# Patient Record
Sex: Female | Born: 1970 | Race: White | Hispanic: Yes | Marital: Single | Smoking: Former smoker
Health system: Southern US, Community
[De-identification: ages and names within clinical notes are randomized; demographics above are authoritative.]

## PROBLEM LIST (undated history)

## (undated) DIAGNOSIS — F32A Depression, unspecified: Secondary | ICD-10-CM

## (undated) DIAGNOSIS — E119 Type 2 diabetes mellitus without complications: Secondary | ICD-10-CM

## (undated) DIAGNOSIS — I10 Essential (primary) hypertension: Secondary | ICD-10-CM

## (undated) DIAGNOSIS — F329 Major depressive disorder, single episode, unspecified: Secondary | ICD-10-CM

## (undated) DIAGNOSIS — E079 Disorder of thyroid, unspecified: Secondary | ICD-10-CM

---

## 2017-04-18 ENCOUNTER — Encounter: Admit: 2017-04-18 | Discharge: 2017-04-18

## 2017-04-18 NOTE — Progress Notes
Pt in ED at [facility] with c/o Suicidal Ideation.  Per [place] psych CN - service is at capacity.

## 2017-04-19 ENCOUNTER — Encounter: Admit: 2017-04-19 | Discharge: 2017-04-19

## 2017-06-04 ENCOUNTER — Encounter: Admit: 2017-06-04 | Discharge: 2017-06-04

## 2017-07-03 ENCOUNTER — Emergency Department (HOSPITAL_COMMUNITY)
Admission: EM | Admit: 2017-07-03 | Discharge: 2017-07-05 | Disposition: A | Discharge status: Other Acute Inpatient Hospital | Payer: Medicaid Other | Attending: Emergency Medicine | Admitting: Emergency Medicine

## 2017-07-03 ENCOUNTER — Encounter (HOSPITAL_COMMUNITY): Payer: Self-pay | Admitting: Emergency Medicine

## 2017-07-03 DIAGNOSIS — R45851 Suicidal ideations: Secondary | ICD-10-CM | POA: Insufficient documentation

## 2017-07-03 DIAGNOSIS — Z7984 Long term (current) use of oral hypoglycemic drugs: Secondary | ICD-10-CM | POA: Diagnosis not present

## 2017-07-03 DIAGNOSIS — Z79899 Other long term (current) drug therapy: Secondary | ICD-10-CM | POA: Insufficient documentation

## 2017-07-03 DIAGNOSIS — I10 Essential (primary) hypertension: Secondary | ICD-10-CM | POA: Diagnosis not present

## 2017-07-03 DIAGNOSIS — X838XXA Intentional self-harm by other specified means, initial encounter: Secondary | ICD-10-CM | POA: Insufficient documentation

## 2017-07-03 DIAGNOSIS — S0081XA Abrasion of other part of head, initial encounter: Secondary | ICD-10-CM | POA: Diagnosis not present

## 2017-07-03 DIAGNOSIS — Z046 Encounter for general psychiatric examination, requested by authority: Secondary | ICD-10-CM | POA: Diagnosis not present

## 2017-07-03 DIAGNOSIS — F329 Major depressive disorder, single episode, unspecified: Secondary | ICD-10-CM | POA: Insufficient documentation

## 2017-07-03 DIAGNOSIS — Y9389 Activity, other specified: Secondary | ICD-10-CM | POA: Diagnosis not present

## 2017-07-03 DIAGNOSIS — E119 Type 2 diabetes mellitus without complications: Secondary | ICD-10-CM | POA: Insufficient documentation

## 2017-07-03 DIAGNOSIS — S0993XA Unspecified injury of face, initial encounter: Secondary | ICD-10-CM | POA: Diagnosis present

## 2017-07-03 DIAGNOSIS — Y999 Unspecified external cause status: Secondary | ICD-10-CM | POA: Insufficient documentation

## 2017-07-03 DIAGNOSIS — Y9289 Other specified places as the place of occurrence of the external cause: Secondary | ICD-10-CM | POA: Insufficient documentation

## 2017-07-03 DIAGNOSIS — F419 Anxiety disorder, unspecified: Secondary | ICD-10-CM | POA: Insufficient documentation

## 2017-07-03 DIAGNOSIS — R451 Restlessness and agitation: Secondary | ICD-10-CM | POA: Insufficient documentation

## 2017-07-03 HISTORY — DX: Type 2 diabetes mellitus without complications: E11.9

## 2017-07-03 HISTORY — DX: Major depressive disorder, single episode, unspecified: F32.9

## 2017-07-03 HISTORY — DX: Disorder of thyroid, unspecified: E07.9

## 2017-07-03 HISTORY — DX: Essential (primary) hypertension: I10

## 2017-07-03 HISTORY — DX: Depression, unspecified: F32.A

## 2017-07-03 LAB — CBC
HCT: 32.3 % — ABNORMAL LOW (ref 36.0–46.0)
HEMOGLOBIN: 11.4 g/dL — AB (ref 12.0–15.0)
MCH: 31.9 pg (ref 26.0–34.0)
MCHC: 35.3 g/dL (ref 30.0–36.0)
MCV: 90.5 fL (ref 78.0–100.0)
PLATELETS: 239 10*3/uL (ref 150–400)
RBC: 3.57 MIL/uL — ABNORMAL LOW (ref 3.87–5.11)
RDW: 12.8 % (ref 11.5–15.5)
WBC: 7.3 10*3/uL (ref 4.0–10.5)

## 2017-07-03 LAB — COMPREHENSIVE METABOLIC PANEL
ALBUMIN: 4 g/dL (ref 3.5–5.0)
ALT: 18 U/L (ref 14–54)
ANION GAP: 8 (ref 5–15)
AST: 20 U/L (ref 15–41)
Alkaline Phosphatase: 77 U/L (ref 38–126)
BILIRUBIN TOTAL: 0.7 mg/dL (ref 0.3–1.2)
BUN: 16 mg/dL (ref 6–20)
CALCIUM: 9.1 mg/dL (ref 8.9–10.3)
CO2: 25 mmol/L (ref 22–32)
CREATININE: 0.75 mg/dL (ref 0.44–1.00)
Chloride: 108 mmol/L (ref 101–111)
GFR calc Af Amer: >60 mL/min (ref >60)
GFR calc non Af Amer: >60 mL/min (ref >60)
GLUCOSE: 111 mg/dL — AB (ref 65–99)
Potassium: 3.2 mmol/L — ABNORMAL LOW (ref 3.5–5.1)
Sodium: 141 mmol/L (ref 135–145)
TOTAL PROTEIN: 7.3 g/dL (ref 6.5–8.1)

## 2017-07-03 LAB — RAPID URINE DRUG SCREEN, HOSP PERFORMED
Amphetamines: NOT DETECTED
Barbiturates: NOT DETECTED
Benzodiazepines: POSITIVE — AB
COCAINE: NOT DETECTED
OPIATES: NOT DETECTED
Tetrahydrocannabinol: NOT DETECTED

## 2017-07-03 LAB — I-STAT BETA HCG BLOOD, ED (MC, WL, AP ONLY): I-stat hCG, quantitative: <5 m[IU]/mL (ref <5)

## 2017-07-03 NOTE — ED Notes (Signed)
Bed: WTR9 Expected date:  Expected time:  Means of arrival:  Comments: 
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
## 2017-07-04 ENCOUNTER — Encounter: Payer: Self-pay | Admitting: Psychiatry

## 2017-07-04 DIAGNOSIS — E119 Type 2 diabetes mellitus without complications: Secondary | ICD-10-CM

## 2017-07-04 DIAGNOSIS — E039 Hypothyroidism, unspecified: Secondary | ICD-10-CM | POA: Diagnosis present

## 2017-07-04 DIAGNOSIS — I10 Essential (primary) hypertension: Secondary | ICD-10-CM | POA: Diagnosis present

## 2017-07-04 DIAGNOSIS — R45851 Suicidal ideations: Secondary | ICD-10-CM

## 2017-07-04 DIAGNOSIS — F250 Schizoaffective disorder, bipolar type: Secondary | ICD-10-CM | POA: Diagnosis present

## 2017-07-04 LAB — ACETAMINOPHEN LEVEL

## 2017-07-04 LAB — ETHANOL: Alcohol, Ethyl (B): <10 mg/dL (ref <10)

## 2017-07-04 LAB — SALICYLATE LEVEL: Salicylate Lvl: <7 mg/dL (ref 2.8–30.0)

## 2017-07-04 MED ORDER — DICLOFENAC SODIUM 75 MG PO TBEC
75.0000 mg | DELAYED_RELEASE_TABLET | Freq: Two times a day (BID) | ORAL | Status: DC
  Administered 2017-07-04 – 2017-07-05 (×3): 75 mg via ORAL
  Filled 2017-07-04 (×5): qty 1

## 2017-07-04 MED ORDER — MIRTAZAPINE 30 MG PO TBDP
30.0000 mg | ORAL_TABLET | Freq: Every day | ORAL | Status: DC
  Administered 2017-07-04: 30 mg via ORAL
  Filled 2017-07-04 (×3): qty 1

## 2017-07-04 MED ORDER — QUETIAPINE FUMARATE 300 MG PO TABS
400.0000 mg | ORAL_TABLET | Freq: Every day | ORAL | Status: DC
  Administered 2017-07-04: 400 mg via ORAL
  Filled 2017-07-04 (×2): qty 1

## 2017-07-04 MED ORDER — OLANZAPINE 2.5 MG PO TABS
2.5000 mg | ORAL_TABLET | Freq: Once | ORAL | Status: AC
  Administered 2017-07-04: 2.5 mg via ORAL
  Filled 2017-07-04: qty 1

## 2017-07-04 MED ORDER — OLANZAPINE 10 MG PO TABS
20.0000 mg | ORAL_TABLET | Freq: Every day | ORAL | Status: DC
  Administered 2017-07-04: 20 mg via ORAL
  Filled 2017-07-04 (×2): qty 2

## 2017-07-04 MED ORDER — PROPRANOLOL HCL 20 MG PO TABS
20.0000 mg | ORAL_TABLET | Freq: Two times a day (BID) | ORAL | Status: DC
  Administered 2017-07-04 – 2017-07-05 (×3): 20 mg via ORAL
  Filled 2017-07-04 (×5): qty 1

## 2017-07-04 MED ORDER — LORAZEPAM 2 MG/ML IJ SOLN
2.0000 mg | Freq: Once | INTRAMUSCULAR | Status: AC
  Administered 2017-07-04: 2 mg via INTRAMUSCULAR
  Filled 2017-07-04: qty 1

## 2017-07-04 MED ORDER — LEVOTHYROXINE SODIUM 50 MCG PO TABS
50.0000 ug | ORAL_TABLET | Freq: Every day | ORAL | Status: DC
  Administered 2017-07-04 – 2017-07-05 (×2): 50 ug via ORAL
  Filled 2017-07-04 (×2): qty 1

## 2017-07-04 MED ORDER — ATORVASTATIN CALCIUM 10 MG PO TABS
10.0000 mg | ORAL_TABLET | Freq: Every day | ORAL | Status: DC
  Administered 2017-07-04: 10 mg via ORAL
  Filled 2017-07-04 (×2): qty 1

## 2017-07-04 MED ORDER — METFORMIN HCL 500 MG PO TABS
500.0000 mg | ORAL_TABLET | Freq: Every day | ORAL | Status: DC
  Administered 2017-07-04 – 2017-07-05 (×2): 500 mg via ORAL
  Filled 2017-07-04 (×2): qty 1

## 2017-07-04 MED ORDER — HALOPERIDOL LACTATE 5 MG/ML IJ SOLN
5.0000 mg | Freq: Once | INTRAMUSCULAR | Status: AC
  Administered 2017-07-04: 5 mg via INTRAMUSCULAR
  Filled 2017-07-04: qty 1

## 2017-07-04 MED ORDER — LOSARTAN POTASSIUM 50 MG PO TABS
50.0000 mg | ORAL_TABLET | Freq: Every day | ORAL | Status: DC
  Administered 2017-07-04 – 2017-07-05 (×2): 50 mg via ORAL
  Filled 2017-07-04 (×2): qty 1

## 2017-07-04 NOTE — ED Notes (Signed)
Pt requesting a "tranquilizer" and attempting to leave the room.

## 2017-07-04 NOTE — ED Notes (Signed)
Psych Provider at bedside. 
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
## 2017-07-04 NOTE — ED Notes (Signed)
TTS assessment in progress. 

## 2017-07-04 NOTE — ED Notes (Signed)
Awaiting on psych bed placement

## 2017-07-04 NOTE — ED Notes (Signed)
Pt requesting anti anxiety medication, NP notified

## 2017-07-04 NOTE — ED Notes (Signed)
Pt A&O x 3,  Agitated, pacing, up to desk multiple times, redirected back to room.  Monitoring for safety, Q 15 min checks in effect.

## 2017-07-04 NOTE — BH Assessment (Signed)
[facility] been [facility].  Accepting physician is Dr. Jennet MaduroPucilowska.  Attending Physician will be Dr. Jennet MaduroPucilowska.  Patient has been assigned to room 323, by [facility] Charge Nurse Vanice SarahAbi.  Call report to [phone].  Representative/Transfer Coordinator is Durward Mallardamille H Patient pre-admitted by [facility] Patient Access Mertie Clause(Jeanelle)   [facility] Staff Felipa Eth(Valarie, TTS) made aware of acceptance.

## 2017-07-04 NOTE — ED Notes (Signed)
RN ALEXIS FROM [facility] CALLED TO GET REPORT ON PT.  RN EXPLAINED TO RN ALEXIS THAT PT IS IVC AND WOULD NOT BE TRANSFERRED UNTIL AM BY [facility].  PTS ROOM ASSIGNMENT IS 323 AND DR Hinton DyerPUCILOWSKI IS ACCEPTING MD.

## 2017-07-04 NOTE — ED Notes (Signed)
MOTHERS CELL [phone] Frederic Jerichoat Give call when pt is d/ced

## 2017-07-04 NOTE — ED Notes (Signed)
Denies SI/HI

## 2017-07-04 NOTE — ED Notes (Signed)
[facility], pt received all night meds prior to arrival @ [facility].
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
## 2017-07-04 NOTE — ED Notes (Signed)
A[facility]

## 2017-07-04 NOTE — ED Notes (Signed)
[facility] TRANSPORT[facility] REQUESTED. 

## 2017-07-04 NOTE — BH Assessment (Signed)
[facility] Assessment Progress Note  Per Juanetta BeetsJacqueline Norman, DO, this pt requires psychiatric hospitalization at this time.  Pt presents under IVC [facility] by [facility], and upheld by Dr Sharma CovertNorman.  The following facilities have been contacted to seek placement for this pt, with results as noted:  Beds available, information sent, decision pending:  [facility] [place]   At capacity:  [facility]   [facility] [place], [facility] Coord[facility] [phone]

## 2017-07-05 ENCOUNTER — Other Ambulatory Visit: Payer: Self-pay

## 2017-07-05 ENCOUNTER — Inpatient Hospital Stay
Admission: AD | Admit: 2017-07-05 | Discharge: 2017-08-03 | DRG: 885 | Disposition: A | Discharge status: Home / Self Care | Payer: Medicaid Other | Attending: Psychiatry | Admitting: Psychiatry

## 2017-07-05 DIAGNOSIS — Z87891 Personal history of nicotine dependence: Secondary | ICD-10-CM | POA: Diagnosis not present

## 2017-07-05 DIAGNOSIS — R197 Diarrhea, unspecified: Secondary | ICD-10-CM | POA: Diagnosis not present

## 2017-07-05 DIAGNOSIS — E119 Type 2 diabetes mellitus without complications: Secondary | ICD-10-CM

## 2017-07-05 DIAGNOSIS — E785 Hyperlipidemia, unspecified: Secondary | ICD-10-CM | POA: Diagnosis present

## 2017-07-05 DIAGNOSIS — F419 Anxiety disorder, unspecified: Secondary | ICD-10-CM | POA: Diagnosis present

## 2017-07-05 DIAGNOSIS — R21 Rash and other nonspecific skin eruption: Secondary | ICD-10-CM | POA: Diagnosis not present

## 2017-07-05 DIAGNOSIS — Z881 Allergy status to other antibiotic agents status: Secondary | ICD-10-CM

## 2017-07-05 DIAGNOSIS — R45851 Suicidal ideations: Secondary | ICD-10-CM | POA: Diagnosis present

## 2017-07-05 DIAGNOSIS — E039 Hypothyroidism, unspecified: Secondary | ICD-10-CM | POA: Diagnosis present

## 2017-07-05 DIAGNOSIS — Z79890 Hormone replacement therapy (postmenopausal): Secondary | ICD-10-CM

## 2017-07-05 DIAGNOSIS — F250 Schizoaffective disorder, bipolar type: Secondary | ICD-10-CM | POA: Diagnosis present

## 2017-07-05 DIAGNOSIS — Z888 Allergy status to other drugs, medicaments and biological substances status: Secondary | ICD-10-CM | POA: Diagnosis not present

## 2017-07-05 DIAGNOSIS — Z9114 Patient's other noncompliance with medication regimen: Secondary | ICD-10-CM | POA: Diagnosis not present

## 2017-07-05 DIAGNOSIS — S0081XA Abrasion of other part of head, initial encounter: Secondary | ICD-10-CM | POA: Diagnosis not present

## 2017-07-05 DIAGNOSIS — Z7984 Long term (current) use of oral hypoglycemic drugs: Secondary | ICD-10-CM | POA: Diagnosis not present

## 2017-07-05 DIAGNOSIS — K5900 Constipation, unspecified: Secondary | ICD-10-CM | POA: Diagnosis not present

## 2017-07-05 DIAGNOSIS — F429 Obsessive-compulsive disorder, unspecified: Secondary | ICD-10-CM | POA: Diagnosis present

## 2017-07-05 DIAGNOSIS — I10 Essential (primary) hypertension: Secondary | ICD-10-CM | POA: Diagnosis present

## 2017-07-05 DIAGNOSIS — F5105 Insomnia due to other mental disorder: Secondary | ICD-10-CM | POA: Diagnosis present

## 2017-07-05 DIAGNOSIS — Z818 Family history of other mental and behavioral disorders: Secondary | ICD-10-CM

## 2017-07-05 DIAGNOSIS — R200 Anesthesia of skin: Secondary | ICD-10-CM

## 2017-07-05 MED ORDER — OLANZAPINE 10 MG PO TABS
30.0000 mg | ORAL_TABLET | Freq: Every day | ORAL | Status: DC
  Administered 2017-07-05 – 2017-07-13 (×9): 30 mg via ORAL
  Filled 2017-07-05 (×9): qty 3

## 2017-07-05 MED ORDER — DICLOFENAC SODIUM 75 MG PO TBEC
75.0000 mg | DELAYED_RELEASE_TABLET | Freq: Two times a day (BID) | ORAL | Status: DC
  Administered 2017-07-05 – 2017-07-06 (×2): 75 mg via ORAL
  Filled 2017-07-05 (×3): qty 1

## 2017-07-05 MED ORDER — ACETAMINOPHEN 325 MG PO TABS
650.0000 mg | ORAL_TABLET | Freq: Four times a day (QID) | ORAL | Status: DC | PRN
  Administered 2017-07-10 – 2017-07-12 (×3): 650 mg via ORAL
  Filled 2017-07-05 (×3): qty 2

## 2017-07-05 MED ORDER — MAGNESIUM HYDROXIDE 400 MG/5ML PO SUSP
30.0000 mL | Freq: Every day | ORAL | Status: DC | PRN
  Administered 2017-07-06 – 2017-07-14 (×4): 30 mL via ORAL
  Filled 2017-07-05 (×4): qty 30

## 2017-07-05 MED ORDER — LEVOTHYROXINE SODIUM 50 MCG PO TABS
50.0000 ug | ORAL_TABLET | Freq: Every day | ORAL | Status: DC
  Administered 2017-07-06: 50 ug via ORAL
  Filled 2017-07-05: qty 1

## 2017-07-05 MED ORDER — QUETIAPINE FUMARATE 200 MG PO TABS
400.0000 mg | ORAL_TABLET | Freq: Every day | ORAL | Status: DC
  Administered 2017-07-05: 400 mg via ORAL
  Filled 2017-07-05: qty 2

## 2017-07-05 MED ORDER — ALUM & MAG HYDROXIDE-SIMETH 200-200-20 MG/5ML PO SUSP: 30.0000 mL | ORAL | Status: DC | PRN

## 2017-07-05 MED ORDER — ATORVASTATIN CALCIUM 20 MG PO TABS
10.0000 mg | ORAL_TABLET | Freq: Every day | ORAL | Status: DC
  Administered 2017-07-05 – 2017-08-02 (×27): 10 mg via ORAL
  Filled 2017-07-05 (×26): qty 1

## 2017-07-05 MED ORDER — OLANZAPINE 2.5 MG PO TABS
2.5000 mg | ORAL_TABLET | Freq: Once | ORAL | Status: AC
  Administered 2017-07-05: 2.5 mg via ORAL
  Filled 2017-07-05: qty 1

## 2017-07-05 MED ORDER — LOSARTAN POTASSIUM 50 MG PO TABS
50.0000 mg | ORAL_TABLET | Freq: Every day | ORAL | Status: DC
  Administered 2017-07-05 – 2017-07-18 (×13): 50 mg via ORAL
  Filled 2017-07-05 (×14): qty 1

## 2017-07-05 MED ORDER — METFORMIN HCL 500 MG PO TABS
500.0000 mg | ORAL_TABLET | Freq: Two times a day (BID) | ORAL | Status: DC
  Administered 2017-07-05 – 2017-07-18 (×26): 500 mg via ORAL
  Filled 2017-07-05 (×27): qty 1

## 2017-07-05 MED ORDER — TRAZODONE HCL 100 MG PO TABS
100.0000 mg | ORAL_TABLET | Freq: Every day | ORAL | Status: DC
  Administered 2017-07-05: 100 mg via ORAL
  Filled 2017-07-05 (×2): qty 1

## 2017-07-05 MED ORDER — PROPRANOLOL HCL 20 MG PO TABS
20.0000 mg | ORAL_TABLET | Freq: Two times a day (BID) | ORAL | Status: DC
  Administered 2017-07-05 – 2017-07-06 (×2): 20 mg via ORAL
  Filled 2017-07-05 (×2): qty 1

## 2017-07-05 NOTE — Progress Notes (Addendum)
Patient found in day room with sister upon my arrival. Patient is visible and social throughout the evening. Patient is hyperactive and preoccupied with shaving her armpits. No staff was available to watch her shave and patient had to be told multiple times. Patient asked to shave in excess of twenty times. Denies SI, HI, AVH. Reports improvement in mood (depression, irritability, and anxiety). Patient remains manic and unable to sit still. Speech is pressured and perseverative. Patient requests 400mg  Seroquel after telling writer that she is not able to sleep with Trazodone. At first, patient reports that she has adverse reaction to Trazodone but then changed her report to state that she felt she would not be able to sleep without Seroquel. Requests and is given Trazodone by patient request. Patient appears to be sleeping @ 2300. Reports eating and voiding adequately. Denies pain. Q 15 minute checks maintained. Will monitor throughout the shift.  Patient slept restlessly. Patient believes it is due to not receiving Remeron. Requests and is given Synthroid early. States, "I take that at 6am at home. It has to be way before food." Will endorse care to oncoming shift.

## 2017-07-05 NOTE — Progress Notes (Addendum)
Recreation Therapy Notes  Date: 01.30.2018  Time: 3:00pm  Location: Craft room  Behavioral response: Appropriate  Group Type: Craft  Participation level: Active  Communication: Patient was social with peers and staff during group.  Comments: N/A  Spyros Winch LRT/CTRS        Cabell Lazenby 07/05/2017 4:00 PM

## 2017-07-05 NOTE — Plan of Care (Signed)
  Progressing Spiritual Needs Ability to function at adequate level 07/05/2017 1557 - Progressing by Crisoforo OxfordKyei, Felecity Lemaster, RN Activity: Interest or engagement in leisure activities will improve 07/05/2017 1557 - Progressing by Crisoforo OxfordKyei, Samvel Zinn, RN Imbalance in normal sleep/wake cycle will improve 07/05/2017 1557 - Progressing by Crisoforo OxfordKyei, Jamaria Amborn, RN Education: Utilization of techniques to improve thought processes will improve 07/05/2017 1557 - Progressing by Crisoforo OxfordKyei, Gean Larose, RN Knowledge of the prescribed therapeutic regimen will improve 07/05/2017 1557 - Progressing by Crisoforo OxfordKyei, Oluwateniola Leitch, RN Coping: Ability to cope will improve 07/05/2017 1557 - Progressing by Crisoforo OxfordKyei, Brenae Lasecki, RN Ability to verbalize feelings will improve 07/05/2017 1557 - Progressing by Crisoforo OxfordKyei, Janean Eischen, RN Health Behavior/Discharge Planning: Ability to make decisions will improve 07/05/2017 1557 - Progressing by Crisoforo OxfordKyei, Nishi Neiswonger, RN Compliance with therapeutic regimen will improve 07/05/2017 1557 - Progressing by Crisoforo OxfordKyei, Vashon Arch, RN Role Relationship: Ability to demonstrate positive changes in social behaviors and relationships will improve 07/05/2017 1557 - Progressing by Crisoforo OxfordKyei, Makhya Arave, RN Safety: Ability to disclose and discuss suicidal ideas will improve 07/05/2017 1557 - Progressing by Crisoforo OxfordKyei, Winfred Iiams, RN Ability to identify and utilize support systems that promote safety will improve 07/05/2017 1557 - Progressing by Crisoforo OxfordKyei, Abimael Zeiter, RN Self-Concept: Ability to verbalize positive feelings about self will improve 07/05/2017 1557 - Progressing by Crisoforo OxfordKyei, Jaymeson Mengel, RN

## 2017-07-05 NOTE — Progress Notes (Signed)
EKG not done. We run out of paper in the machine. Went to  Cardiopulmonary to get a roll of paper but was told they do not carry EKG papers. Was directed to call supply chain. Supply chain indicated they has processed the order. Night shift nurse informed to do the EKG when the paper gets to the unit.

## 2017-07-05 NOTE — Plan of Care (Signed)
  Progressing Coping: Ability to verbalize feelings will improve 07/05/2017 2318 - Progressing by Galen ManilaVigil, Anastasha Ortez E, RN

## 2017-07-05 NOTE — Progress Notes (Signed)
The patient is admitted [facility] to room [facility]. Alert and oriented  x 4. Denied any acute pain, no acute distress noted. Patient is IVC. Denied any SI/HI at this time. Verbalized she's still depressed and anxious but doesn't have any thought of harming herself at this moment. [facility] with [facility], noted redness on her forehead, tattoo on her lower back and at the back of her neck.  Patient's belongings searched and one pair of black boots locked in security locker. No contraband found on patient or her belongings. Observed some impulsiveness when trying to admit patient into Epic. Patient didn't have patience to wait and kept on interrupting the RN. Patient oriented to  her room. Surveillance camera and 15 minutes checks continue.  Will continue to monitor.

## 2017-07-05 NOTE — ED Provider Notes (Signed)
Patient accepted to [facility] for psych.  EMTALA performed   Gerhard MunchLockwood, Princella Jaskiewicz, MD 07/05/17 [phone]

## 2017-07-05 NOTE — Tx Team (Signed)
Initial Treatment Plan 07/05/2017 3:39 PM Jeanne Hendricks ZOX:[id number]    PATIENT STRESSORS: Health problems   PATIENT STRENGTHS: Ability for insight Motivation for treatment/growth   PATIENT IDENTIFIED PROBLEMS:                      DISCHARGE CRITERIA:  Ability to meet basic life and health needs Improved stabilization in mood, thinking, and/or behavior Motivation to continue treatment in a less acute level of care Need for constant or close observation no longer present Reduction of life-threatening or endangering symptoms to within safe limits Verbal commitment to aftercare and medication compliance  PRELIMINARY DISCHARGE PLAN: Outpatient therapy  PATIENT/FAMILY INVOLVEMENT: This treatment plan has been presented to and reviewed with the patient, Jeanne Hendricks, and/or family member,   The patient and family have been given the opportunity to ask questions and make suggestions.  Crisoforo Oxfordharlotte Torben Soloway, RN 07/05/2017, 3:39 PM

## 2017-07-05 NOTE — BH Assessment (Signed)
BHH Assessment Progress Note  Pt presented under IVC initiated on 06/30/2017, with a Findings and Custody Order issued by the magistrate on the same day.  Law enforcement, however, did not serve it until 07/03/2017 by which time the Findings and Custody Order had expired.  Per Juanetta BeetsJacqueline Norman, DO, pt continues to require psychiatric hospitalization, and to meet criteria for IVC, which she has initiated.  [place], and at 11:42 Burna CashMagistrate Morton confirms receipt.  Findings and Custody Order has since been served, and IVC documents have been faxed to [facility].  At 13:16 Fed[place] confirms receipt.  Jeanne Hendricks, [place] Behavioral Health Coordinator [phone]

## 2017-07-06 DIAGNOSIS — F429 Obsessive-compulsive disorder, unspecified: Secondary | ICD-10-CM | POA: Diagnosis present

## 2017-07-06 LAB — TSH: TSH: 31.372 u[IU]/mL — ABNORMAL HIGH (ref 0.350–4.500)

## 2017-07-06 LAB — LIPID PANEL
CHOL/HDL RATIO: 2.6 ratio
CHOLESTEROL: 103 mg/dL (ref 0–200)
HDL: 39 mg/dL — AB (ref >40)
LDL Cholesterol: 42 mg/dL (ref 0–99)
Triglycerides: 112 mg/dL (ref <150)
VLDL: 22 mg/dL (ref 0–40)

## 2017-07-06 LAB — HEMOGLOBIN A1C
Hgb A1c MFr Bld: 5.1 % (ref 4.8–5.6)
MEAN PLASMA GLUCOSE: 99.67 mg/dL

## 2017-07-06 MED ORDER — FLUVOXAMINE MALEATE 50 MG PO TABS
100.0000 mg | ORAL_TABLET | Freq: Every day | ORAL | Status: DC
  Administered 2017-07-06 – 2017-07-12 (×7): 100 mg via ORAL
  Filled 2017-07-06 (×7): qty 2

## 2017-07-06 MED ORDER — DIVALPROEX SODIUM 500 MG PO DR TAB
500.0000 mg | DELAYED_RELEASE_TABLET | Freq: Three times a day (TID) | ORAL | Status: DC
  Administered 2017-07-06 – 2017-07-10 (×12): 500 mg via ORAL
  Filled 2017-07-06 (×12): qty 1

## 2017-07-06 MED ORDER — LORAZEPAM 1 MG PO TABS
1.0000 mg | ORAL_TABLET | Freq: Four times a day (QID) | ORAL | Status: DC | PRN
  Administered 2017-07-06 (×2): 1 mg via ORAL
  Filled 2017-07-06 (×2): qty 1

## 2017-07-06 MED ORDER — LORAZEPAM 0.5 MG PO TABS
0.5000 mg | ORAL_TABLET | Freq: Three times a day (TID) | ORAL | Status: DC
  Administered 2017-07-06 – 2017-07-09 (×8): 0.5 mg via ORAL
  Filled 2017-07-06 (×10): qty 1

## 2017-07-06 MED ORDER — LEVOTHYROXINE SODIUM 100 MCG PO TABS
100.0000 ug | ORAL_TABLET | Freq: Every day | ORAL | Status: DC
  Administered 2017-07-07 – 2017-08-03 (×24): 100 ug via ORAL
  Filled 2017-07-06 (×27): qty 1

## 2017-07-06 MED ORDER — TEMAZEPAM 15 MG PO CAPS
30.0000 mg | ORAL_CAPSULE | Freq: Every day | ORAL | Status: DC
Start: 2017-07-06 — End: 2017-07-10
  Administered 2017-07-06 – 2017-07-09 (×4): 30 mg via ORAL
  Filled 2017-07-06 (×4): qty 2

## 2017-07-06 NOTE — Progress Notes (Addendum)
Nutrition Brief Note  Patient identified on the Malnutrition Screening Tool (MST) Report  47 y.o. Depression, DM, HTN, thyroid disease, presenting to the ED for psychiatric evaluation.  Wt Readings from Last 15 Encounters:  07/05/17 146 lb (66.2 kg)    Body mass index is 24.3 kg/m. Patient meets criteria for normal weight based on current BMI.   Current diet order is regular, patient is consuming approximately 75-100% of meals at this time. Labs and medications reviewed.   No nutrition interventions warranted at this time. If nutrition issues arise, please consult RD.   Betsey Holidayasey Miaisabella Bacorn MS, RD, LDN Pager #- [phone] After Hours Pager: [phone]

## 2017-07-06 NOTE — BHH Suicide Risk Assessment (Signed)
[facility] Admission Suicide Risk Assessment   Nursing information obtained from:    Demographic factors:    Current Mental Status:    Loss Factors:    Historical Factors:    Risk Reduction Factors:     Total Time spent with patient: 1 hour Principal Problem: Schizoaffective disorder, bipolar type (HCC) Diagnosis:   Patient Active Problem List   Diagnosis Date Noted  . Schizoaffective disorder, bipolar type (HCC) [F25.0] 07/04/2017    Priority: High  . HTN (hypertension) [I10] 07/04/2017  . Diabetes (HCC) [E11.9] 07/04/2017  . Hypothyroidism [E03.9] 07/04/2017  . Suicidal ideation [R45.851] 07/04/2017   Subjective Data: suicidal ideation  Continued Clinical Symptoms:    The "Alcohol Use Disorders Identification Test", Guidelines for Use in Primary Care, Second Edition.  World Science writer [facility]). Score between 0-7:  no or low risk or alcohol related problems. Score between 8-15:  moderate risk of alcohol related problems. Score between 16-19:  high risk of alcohol related problems. Score 20 or above:  warrants further diagnostic evaluation for alcohol dependence and treatment.   CLINICAL FACTORS:   Bipolar Disorder:   Mixed State Depression:   Impulsivity Insomnia Obsessive-Compulsive Disorder   Musculoskeletal: Strength & Muscle Tone: within normal limits Gait & Station: normal Patient leans: N/A  Psychiatric Specialty Exam: Physical Exam  Nursing note and vitals reviewed. Psychiatric: Her mood appears anxious. Her affect is inappropriate. Her speech is delayed. She is hyperactive. Cognition and memory are impaired. She expresses impulsivity. She expresses suicidal ideation. She expresses suicidal plans.    Review of Systems  Neurological: Negative.   Psychiatric/Behavioral: Positive for suicidal ideas. The patient is nervous/anxious and has insomnia.   All other systems reviewed and are negative.   Blood pressure 124/89, pulse 97, temperature 98.6 F (37 C),  temperature source Oral, resp. rate 18, height 5\' 5"  (1.651 m), weight 66.2 kg (146 lb), SpO2 100 %.Body mass index is 24.3 kg/m.  General Appearance: Disheveled  Eye Contact:  Good  Speech:  Normal Rate  Volume:  Normal  Mood:  Dysphoric and Irritable  Affect:  Inappropriate and Labile  Thought Process:  Disorganized and Descriptions of Associations: Intact  Orientation:  Full (Time, Place, and Person)  Thought Content:  Obsessions and Paranoid Ideation  Suicidal Thoughts:  Yes.  with intent/plan  Homicidal Thoughts:  No  Memory:  Immediate;   Poor Recent;   Poor Remote;   Poor  Judgement:  Poor  Insight:  Lacking  Psychomotor Activity:  Increased  Concentration:  Concentration: Fair and Attention Span: Fair  Recall:  [employer] of Knowledge:  Fair  Language:  Fair  Akathisia:  No  Handed:  Right  AIMS (if indicated):     Assets:  Communication Skills Desire for Improvement Financial Resources/Insurance Housing Physical Health Resilience Social Support  ADL's:  Intact  Cognition:  WNL  Sleep:  Number of Hours: 7      COGNITIVE FEATURES THAT CONTRIBUTE TO RISK:  None    SUICIDE RISK:   Moderate:  Frequent suicidal ideation with limited intensity, and duration, some specificity in terms of plans, no associated intent, good self-control, limited dysphoria/symptomatology, some risk factors present, and identifiable protective factors, including available and accessible social support.  PLAN OF CARE: hospital admission, medication management, discharge planning.  Ms. Ruotolo is a 47 year old female with a history of bipolar disorder admitted for psychotic break.  #Suicidal ideation -patient able to contract for safety  #Mood and psychosis -continue Zyprexa 30 mg nightly -  consider Vraylar -start Depakote 500 mg TID -Restoril 30 mg nightly -start Ativan 0.5 mg TID  #OCD -start Luvox 100 mg nightly  #DM -Metformin 500 mm BID  #Hypothyroidism -increase Synthroid  100 mcg  #HTN -Cozaar 50 mg daily  #Dyslipidemia -Lipitor 10 mg daily  #Metabolic syndrome monitoring -Lipid panel, TSH, HgbA1C are pending -EKG, pending -pregnancy, ovaries removed  #Disposition -discharge with the mother -[facility]        I certify that inpatient services furnished can reasonably be expected to improve the patient's condition.   Kristine LineaJolanta Pucilowska, MD 07/06/2017, 3:28 PM

## 2017-07-06 NOTE — Progress Notes (Signed)
Recreation Therapy Notes   Date: 01.31.2019  Time: 1:00 PM  Location: Craft Room  Behavioral response: Anxious  Intervention Topic: Communication  Discussion/Intervention: Group content today was focused on communication. The group defined communication and ways to communicate with others. Individuals stated reason why communication is important and some reasons to communicate with others. Patients expressed if they thought they were good at communicating with others and ways they could improve their communication skills. The group identified important parts of communication and some experiences they have had in the past with communication. The group participated in the intervention "What is that", where participants had a chance to test out their communication skills and identify ways to improve their communication techniques.  Clinical Observations/Feedback:  Patient came to group and defined communication as keeping in touch. She stated electronics is a way to keep in touch and have power. Individual stated many times in group that she was anxious and needed to leave, but never left. Patient walked around group and stated she was anxious. She participated in the intervention and continues to make progress towards her goals.  Peyson Delao LRT/CTRS         Chae Shuster 07/06/2017 2:34 PM

## 2017-07-06 NOTE — Plan of Care (Signed)
  Progressing Coping: Ability to verbalize feelings will improve 07/06/2017 2151 - Progressing by Galen ManilaVigil, Srihari Shellhammer E, RN   Not Progressing Spiritual Needs Ability to function at adequate level 07/06/2017 2151 - Not Progressing by Galen ManilaVigil, Melvinia Ashby E, RN Education: Utilization of techniques to improve thought processes will improve 07/06/2017 2151 - Not Progressing by Galen ManilaVigil, Loys Shugars E, RN Knowledge of the prescribed therapeutic regimen will improve 07/06/2017 2151 - Not Progressing by Galen ManilaVigil, Rollins Wrightson E, RN Coping: Ability to cope will improve 07/06/2017 2151 - Not Progressing by Galen ManilaVigil, Cyra Spader E, RN Role Relationship: Ability to demonstrate positive changes in social behaviors and relationships will improve 07/06/2017 2151 - Not Progressing by Galen ManilaVigil, Jaysa Kise E, RN

## 2017-07-06 NOTE — BHH Group Notes (Signed)
BHH Group Notes:  (Nursing/MHT/Case Management/Adjunct)  Date:  07/06/2017  Time:  2:35 PM  Type of Therapy:  Psychoeducational Skills  Participation Level:  Did Not Attend  Lynelle SmokeCara Travis [facility] 07/06/2017, 2:35 PM
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
## 2017-07-06 NOTE — [facility] Group Notes (Signed)
[facility] Group Notes:  (Nursing/MHT/Case Management/Adjunct)  Date:  07/06/2017  Time:  9:58 PM  Type of Therapy:  Group Therapy  Participation Level:  Active  Participation Quality:  Appropriate  Affect:  Appropriate  Cognitive:  Appropriate  Insight:  Appropriate  Engagement in Group:  Engaged  Modes of Intervention:  Discussion  Summary of Progress/Problems:  Jeanne [name] 07/06/2017, 9:58 PM

## 2017-07-06 NOTE — [facility] Group Notes (Signed)
LCSW Group Therapy Note 07/06/2017 9:00 AM  Type of Therapy and Topic:  Group Therapy:  Setting Goals  Participation Level:  Did Not Attend  Description of Group: In this process group, patients discussed using strengths to work toward goals and address challenges.  Patients identified two positive things about themselves and one goal they were working on.  Patients were given the opportunity to share openly and support each other's plan for self-empowerment.  The group discussed the value of gratitude and were encouraged to have a daily reflection of positive characteristics or circumstances.  Patients were encouraged to identify a plan to utilize their strengths to work on current challenges and goals.  Therapeutic Goals 1. Patient will verbalize personal strengths/positive qualities and relate how these can assist with achieving desired personal goals 2. Patients will verbalize affirmation of peers plans for personal change and goal setting 3. Patients will explore the value of gratitude and positive focus as related to successful achievement of goals 4. Patients will verbalize a plan for regular reinforcement of personal positive qualities and circumstances.  Summary of Patient Progress:       Therapeutic Modalities Cognitive Behavioral Therapy Motivational Interviewing    Myeisha Kruser S Rene Sizelove, LCSW 07/06/2017 10:55 AM  

## 2017-07-06 NOTE — Progress Notes (Addendum)
Patient found in day area upon my arrival. Patient is visible and social throughout the evening. Patient continues to be very needy, asking the same questions multiple times. Patient knocks on the nursing station door multiple times during a visit by her mother and sister. Patient is restless and fidgety, intrusive, speech is pressured. Denies SI, HI, AVH. Reports "very high anxiety." Educated patient on availability of PRN Ativan for anxiety several times. Patient asks multiple times about anxiety medication and sleep medication. Patient is preoccupied with receiving Ativan. Encouraged patient to allow HS medication to work rather than attempting to stay awake until Ativan is available @2345 . Verbalized understanding but continues to perseverate obsessively about Ativan. Reports eating and voiding adequately. Denies pain.  First doses of Luvox and Restoril given. Will monitor for efficacy. Compliant with HS medications and staff direction. Q 15 minute checks maintained. Will continue to monitor throughout the shift.  @2240 , patient remains awake. Patient has come to nursing station multiple times in last 30 minutes to ask about the Ativan PRN. Educated patient each time that 2345 was the soonest she is able to receive this medication. Each time, the patient verbalizes understanding and returns to ask the same question again. @ 2340, patient given Ativan PRN. Will monitor for efficacy.  Patient slept 5.75 hours. No apparent distress. Patient very groggy this morning but immediately asking for Ativan PRN. Educated patient on the criteria for Ativan administration. Compliant with am medication and vitals. Will endorse care to oncoming shift.

## 2017-07-06 NOTE — [facility] Group Notes (Signed)
07/06/2017  Time: 0930  Type of Therapy/Topic:  Group Therapy:  Balance in Life  Participation Level:  Minimal  Description of Group:   This group will address the concept of balance and how it feels and looks when one is unbalanced. Patients will be encouraged to process areas in their lives that are out of balance and identify reasons for remaining unbalanced. Facilitators will guide patients in utilizing problem-solving interventions to address and correct the stressor making their life unbalanced. Understanding and applying boundaries will be explored and addressed for obtaining and maintaining a balanced life. Patients will be encouraged to explore ways to assertively make their unbalanced needs known to significant others in their lives, using other group members and facilitator for support and feedback.  Therapeutic Goals: 1. Patient will identify two or more emotions or situations they have that consume much of in their lives. 2. Patient will identify signs/triggers that life has become out of balance:  3. Patient will identify two ways to set boundaries in order to achieve balance in their lives:  4. Patient will demonstrate ability to communicate their needs through discussion and/or role plays  Summary of Patient Progress: Pt attended group and participated in the check in. Pt stated she is feeling, "anxious because I'm in this group." Pt then walked in and out of group several times. CSW attempted to redirect pt and explain that going in and out of group is distracting for other group members. Pt then walked in and out several more times, until eventually pt did not return to group.   Therapeutic Modalities:   Cognitive Behavioral Therapy Solution-Focused Therapy Assertiveness Training  Heidi DachKelsey Moriyah Byington, MSW, LCSW 07/06/2017 10:32 AM

## 2017-07-06 NOTE — Progress Notes (Signed)
This Clinical research associatewriter was walking out of the nurses station.  Patient got off telephone and took a few steps and went down on her knees and then lay on her side.  When asked what was wrong patient states  "I am dizzy" Patient stood up and this writer informed her that she needed to go to her room because she need to lay down if she is dizzy.  Patient states "I don't want to go to my room"  Informed that she could not be up walking around if she is dizzy.  Patient then states "I am not dizzy"  Then few seconds later states that she is.  Then after a few minutes patient states "I don't need to go to my room I was faking it because I wanted attention."

## 2017-07-06 NOTE — Plan of Care (Signed)
  Spiritual Needs Ability to function at adequate level 07/06/2017 1816 - Not Progressing by Elige Radonobb, Enza Shone B, RN Note Patient is intrusive.  Presents frequently to nurses station asking the same question.  Tries to split staff.     Activity: Interest or engagement in leisure activities will improve 07/06/2017 1816 - Progressing by Elige Radonobb, Damon Baisch B, RN Note Visible in the milieu.  Attended 2 groups.    Education: Utilization of techniques to improve thought processes will improve 07/06/2017 1816 - Not Progressing by Elige Radonobb, Zakaria Fromer B, RN Note Patient verbalizes  "I can't help the way I am"  Patient gets fixated on on thing and tends to obsesses over ith.   Knowledge of the prescribed therapeutic regimen will improve 07/06/2017 1816 - Progressing by Elige Radonobb, Arlone Lenhardt B, RN Note Knowledgeable about medications.     Education: Knowledge of the prescribed therapeutic regimen will improve 07/06/2017 1816 - Progressing by Elige Radonobb, Zlatan Hornback B, RN Note Knowledgeable about medications.     Coping: Ability to cope will improve 07/06/2017 1816 - Not Progressing by Elige Radonobb, Griselle Rufer B, RN Note Verbalizes that she is anxious and states that she does not have any coping skills.  Repeated requests anxiety medications.  At one point approached Dr. Demetrius CharityP and said "You are ignoring me.  Why haven't you given me anything for anxiety. " Ability to verbalize feelings will improve 07/06/2017 1816 - Progressing by Elige Radonobb, Satish Hammers B, RN Note Able to verbalize thoughts although are tangential.     Health Behavior/Discharge Planning: Ability to make decisions will improve 07/06/2017 1816 - Not Progressing by Elige Radonobb, Taaj Hurlbut B, RN Note indecisive Compliance with therapeutic regimen will improve 07/06/2017 1816 - Progressing by Elige Radonobb, Hampton Cost B, RN Note Medication compliant   Role Relationship: Ability to demonstrate positive changes in social behaviors and relationships will improve 07/06/2017 1816 - Not Progressing by Elige Radonobb,  Kathlene Yano B, RN Note Intrusive, demanding and needy.  Exhibits medication seeking behavior.     Safety: Ability to disclose and discuss suicidal ideas will improve 07/06/2017 1816 - Progressing by Elige Radonobb, Yamna Mackel B, RN Note Denies SI   Self-Concept: Ability to verbalize positive feelings about self will improve 07/06/2017 1816 - Not Progressing by Elige Radonobb, Macon Sandiford B, RN

## 2017-07-06 NOTE — Progress Notes (Signed)
Patient ID: Jeanne Hendricks, female   DOB: Oct 17, 1970, 47 y.o.   MRN: [id number] CSW attempted to contact pt's mother, [name] at [phone]. Pt had identified her mother as her primary support person and given consent for CSW to contact [name] to provide suicide prevention education.  CSW left VM with request for callback.

## 2017-07-06 NOTE — Progress Notes (Signed)
Recreation Therapy Notes  INPATIENT RECREATION THERAPY ASSESSMENT  Patient Details Name: Jeanne Hendricks MRN: [id number] DOB: 07-01-1970 Today's Date: 07/06/2017       Information Obtained From: Patient  Able to Participate in Assessment/Interview: Yes  Patient Presentation: Responsive  Reason for Admission (Per Patient): Active Symptoms  Patient Stressors: Other (Comment)(Anxiety, Depression)  Coping Skills:   Other (Comment)(I have none)  Leisure Interests (2+):  (Nothing)  Frequency of Recreation/Participation:    Awareness of Community Resources:  No  Community Resources:     Current Use:    If no, Barriers?:    Expressed Interest in [employer] Resource Information: No  Patient Main Form of Transportation: Set designerCar  Patient Strengths:  Caring, Lovable  Patient Identified Areas of Improvement:  I do not know  Current Recreation Participation:  None  Patient Goal for Hospitalization:  To get out  [place]idence[place]  County of Residence:  [place]  Current SI (including self-harm):  No  Current HI:  No  Current AVH: No  Staff Intervention Plan: Group Attendance, Collaborate with Interdisciplinary Treatment Team, Provide Community Resources  Consent to Intern Participation: N/A  Emylia Latella 07/06/2017, 11:26 AM

## 2017-07-06 NOTE — Progress Notes (Signed)
Recreation Therapy Notes   Date: 01.31.2018  Time: 3:00pm  Location: Craft room  Behavioral response: Attention seeking  Group Type: Craft  Participation level: None  Communication: N/A  Comments: Patient walked in and out of group several times and stated she was having an panic attack and her anxiety was bothering her. Patient walked in repeatedly stating the same thing. She was told by staff she could go to her room but patient continued to rome the halls and walk in and out of group constantly.   Keturah Yerby LRT/CTRS        Sonja Manseau 07/06/2017 4:12 PM
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
## 2017-07-07 DIAGNOSIS — R45851 Suicidal ideations: Secondary | ICD-10-CM

## 2017-07-07 MED ORDER — MAGNESIUM CITRATE PO SOLN
1.0000 | Freq: Once | ORAL | Status: AC
  Administered 2017-07-08: 1 via ORAL
  Filled 2017-07-07: qty 296

## 2017-07-07 MED ORDER — CHLORPROMAZINE HCL 100 MG PO TABS
50.0000 mg | ORAL_TABLET | Freq: Four times a day (QID) | ORAL | Status: DC | PRN
  Administered 2017-07-07 – 2017-07-08 (×3): 50 mg via ORAL
  Filled 2017-07-07 (×3): qty 1

## 2017-07-07 NOTE — Progress Notes (Signed)
Pt. Observed resting in her room. Oral PRN medications effective in reducing agitations, anxieties, and intrusive behavior at this time. Will continue to monitor.

## 2017-07-07 NOTE — Progress Notes (Addendum)
[facility] MD Progress Note  07/07/2017 1:08 PM Jeanne Hendricks  MRN:  [id number]  Subjective:    Jeanne Hendricks met with treatment team today. She is much more composed. She slept through the night. She has been asking for Ativan and I had to discontinue PRN order. She did not have anxiety attacks as of yet today.   Treatment plan. We will continue Zyprexa, depakote and low dose Ativan for mood stabilization and Luvox for anxiety. Thorazine is available for agitation.   Social/disposition. She will be discharged with her mother. Follow up with [facility].  Principal Problem: Schizoaffective disorder, bipolar type ([place]) Diagnosis:   Patient Active Problem List   Diagnosis Date Noted  . Schizoaffective disorder, bipolar type ([place]) [F25.0] 07/04/2017    Priority: High  . OCD (obsessive compulsive disorder) [F42.9] 07/06/2017  . HTN (hypertension) [I10] 07/04/2017  . Diabetes ([place]) [E11.9] 07/04/2017  . Hypothyroidism [E03.9] 07/04/2017  . Suicidal ideation [R45.851] 07/04/2017   Total Time spent with patient: 30 minutes  Past Psychiatric History: bipolar disorder, OCD  Past Medical History:  Past Medical History:  Diagnosis Date  . Depression   . Diabetes mellitus without complication ([place])   . Hypertension   . Thyroid disease    History reviewed. No pertinent surgical history. Family History: History reviewed. No pertinent family history. Family Psychiatric  History: bipolar disorder, ADHD Social History:  Social History   Substance and Sexual Activity  Alcohol Use No  . Frequency: Never     Social History   Substance and Sexual Activity  Drug Use No    Social History   Socioeconomic History  . Marital status: Single    Spouse name: None  . Number of children: None  . Years of education: None  . Highest education level: None  Social Needs  . Financial resource strain: None  . Food insecurity - worry: None  . Food insecurity - inability: None  . Transportation needs - medical:  None  . Transportation needs - non-medical: None  Occupational History  . None  Tobacco Use  . Smoking status: Former Research scientist (life sciences)  . Smokeless tobacco: Former Network engineer and Sexual Activity  . Alcohol use: No    Frequency: Never  . Drug use: No  . Sexual activity: None  Other Topics Concern  . None  Social History Narrative  . None   Additional Social History:                         Sleep: Fair  Appetite:  Fair  Current Medications: Current Facility-Administered Medications  Medication Dose Route Frequency Provider Last Rate Last Dose  . acetaminophen (TYLENOL) tablet 650 mg  650 mg Oral Q6H PRN Abednego Yeates B, MD      . alum & mag hydroxide-simeth (MAALOX/MYLANTA) 200-200-20 MG/5ML suspension 30 mL  30 mL Oral Q4H PRN Raylie Maddison B, MD      . atorvastatin (LIPITOR) tablet 10 mg  10 mg Oral q1800 Mande Auvil B, MD   10 mg at 07/06/17 1708  . chlorproMAZINE (THORAZINE) tablet 50 mg  50 mg Oral QID PRN Melanni Benway B, MD      . divalproex (DEPAKOTE) DR tablet 500 mg  500 mg Oral Q8H Saloni Lablanc B, MD   500 mg at 07/07/17 0615  . fluvoxaMINE (LUVOX) tablet 100 mg  100 mg Oral QHS Kadija Cruzen B, MD   100 mg at 07/06/17 2102  . levothyroxine (SYNTHROID, LEVOTHROID) tablet  100 mcg  100 mcg Oral QAC breakfast Tyrell Seifer B, MD   100 mcg at 07/07/17 0854  . LORazepam (ATIVAN) tablet 0.5 mg  0.5 mg Oral TID Darnell Stimson B, MD   0.5 mg at 07/07/17 0853  . losartan (COZAAR) tablet 50 mg  50 mg Oral Daily Kianna Billet B, MD   50 mg at 07/07/17 0853  . magnesium hydroxide (MILK OF MAGNESIA) suspension 30 mL  30 mL Oral Daily PRN Izzie Geers B, MD   30 mL at 07/06/17 1428  . metFORMIN (GLUCOPHAGE) tablet 500 mg  500 mg Oral BID WC Lashon Hillier B, MD   500 mg at 07/07/17 0854  . OLANZapine (ZYPREXA) tablet 30 mg  30 mg Oral QHS Venice Liz B, MD   30 mg at 07/06/17 2103  . temazepam  (RESTORIL) capsule 30 mg  30 mg Oral QHS Naseem Adler B, MD   30 mg at 07/06/17 2103    Lab Results:  Results for orders placed or performed during the hospital encounter of 07/05/17 (from the past 48 hour(s))  Hemoglobin A1c     Status: None   Collection Time: 07/06/17  7:23 AM  Result Value Ref Range   Hgb A1c MFr Bld 5.1 4.8 - 5.6 %    Comment: (NOTE) Pre diabetes:          5.7%-6.4% Diabetes:              >6.4% Glycemic control for   <7.0% adults with diabetes    Mean Plasma Glucose 99.67 mg/dL    Comment: Performed at [facility], [place] [address], [place], [place] [address]  Lipid panel     Status: Abnormal   Collection Time: 07/06/17  7:23 AM  Result Value Ref Range   Cholesterol 103 0 - 200 mg/dL   Triglycerides 112 <150 mg/dL   HDL 39 (L) >40 mg/dL   Total CHOL/HDL Ratio 2.6 RATIO   VLDL 22 0 - 40 mg/dL   LDL Cholesterol 42 0 - 99 mg/dL    Comment:        Total Cholesterol/HDL:CHD Risk Coronary Heart Disease Risk Table                     Men   Women  1/2 Average Risk   3.4   3.3  Average Risk       5.0   4.4  2 X Average Risk   9.6   7.1  3 X Average Risk  23.4   11.0        Use the calculated Patient Ratio above and the CHD Risk Table to determine the patient's CHD Risk.        ATP III CLASSIFICATION (LDL):  <100     mg/dL   Optimal  100-129  mg/dL   Near or Above                    Optimal  130-159  mg/dL   Borderline  160-189  mg/dL   High  >190     mg/dL   Very High Performed at [facility], [address] [place], [place] [address]   TSH     Status: Abnormal   Collection Time: 07/06/17  7:23 AM  Result Value Ref Range   TSH 31.372 (H) 0.350 - 4.500 uIU/mL    Comment: Performed by a 3rd Generation assay with a functional sensitivity of <=0.01 uIU/mL. Performed at  [facility], [address], [place], [place] [address]     Blood Alcohol level:  Lab Results  Component Value Date   ETH <10 32/20/2542     Metabolic Disorder Labs: Lab Results  Component Value Date   HGBA1C 5.1 07/06/2017   MPG 99.67 07/06/2017   No results found for: PROLACTIN Lab Results  Component Value Date   CHOL 103 07/06/2017   TRIG 112 07/06/2017   HDL 39 (L) 07/06/2017   CHOLHDL 2.6 07/06/2017   VLDL 22 07/06/2017   LDLCALC 42 07/06/2017    Physical Findings: AIMS:  , ,  ,  ,    CIWA:    COWS:     Musculoskeletal: Strength & Muscle Tone: within normal limits Gait & Station: normal Patient leans: N/A  Psychiatric Specialty Exam: Physical Exam  Nursing note and vitals reviewed. Psychiatric: Her speech is normal. Her mood appears anxious. Her affect is labile and inappropriate. She is withdrawn. Thought content is paranoid. Cognition and memory are normal. She expresses impulsivity. She expresses suicidal ideation.    Review of Systems  Neurological: Negative.   Psychiatric/Behavioral: Positive for suicidal ideas. The patient is nervous/anxious.   All other systems reviewed and are negative.   Blood pressure 128/78, pulse 90, temperature 98.6 F (37 C), temperature source Oral, resp. rate 18, height '5\' 5"'$  (1.651 m), weight 66.2 kg (146 lb), SpO2 100 %.Body mass index is 24.3 kg/m.  General Appearance: Disheveled  Eye Contact:  Good  Speech:  Clear and Coherent  Volume:  Normal  Mood:  Anxious  Affect:  Blunt  Thought Process:  Disorganized and Descriptions of Associations: Tangential  Orientation:  Full (Time, Place, and Person)  Thought Content:  WDL  Suicidal Thoughts:  No  Homicidal Thoughts:  No  Memory:  Immediate;   Poor Recent;   Poor Remote;   Poor  Judgement:  Poor  Insight:  Lacking  Psychomotor Activity:  Normal  Concentration:  Concentration: Poor and Attention Span: Poor  Recall:  Poor  Fund of Knowledge:  Fair  Language:  Fair  Akathisia:  No  Handed:  Right  AIMS (if indicated):     Assets:  Communication Skills Desire for Improvement Housing Physical  Health Resilience Social Support  ADL's:  Intact  Cognition:  WNL  Sleep:  Number of Hours: 7     Treatment Plan Summary: Daily contact with patient to assess and evaluate symptoms and progress in treatment and Medication management   Jeanne Hendricks is a 47 year old female with a history of bipolar disorder admitted for psychotic break.  #Suicidal ideation -patient able to contract for safety  #Mood and psychosis -continue Zyprexa 30 mg nightly -continue Depakote 500 mg TID -continue Restoril 30 mg nightly -continue Ativan 0.5 mg TID  #OCD -continue Luvox 100 mg nightly  #Hypothyroidism -increase Synthroid 100 mcg  #HTN -Cozaar 50 mg daily  #Dyslipidemia -Lipitor 10 mg daily  #Metabolic syndrome monitoring -Lipid panel, TSH, HgbA1C are unremarkable -EKG, pending -pregnancy test is negative  #Disposition -discharge with the mother -follow up with [facility]     Orson Slick, MD 07/07/2017, 1:08 PM

## 2017-07-07 NOTE — BHH Suicide Risk Assessment (Signed)
BHH INPATIENT:  Family/Significant Other Suicide Prevention Education  Suicide Prevention Education:  Education Completed; Jeanne Hendricks (mother at [phone]) has been identified by the patient as the family member/significant other with whom the patient will be residing, and identified as the person(s) who will aid the patient in the event of a mental health crisis (suicidal ideations/suicide attempt).  With written consent from the patient, the family member/significant other has been provided the following suicide prevention education, prior to the and/or following the discharge of the patient.  The suicide prevention education provided includes the following:  Suicide risk factors  Suicide prevention and interventions  National Suicide Hotline telephone number  [facility] assessment telephone number  [facility] Emergency Assistance 911  [facility] and/or Residential Mobile Crisis Unit telephone number  Request made of family/significant other to:  Remove weapons (e.g., guns, rifles, knives), all items previously/currently identified as safety concern.    Remove drugs/medications (over-the-counter, prescriptions, illicit drugs), all items previously/currently identified as a safety concern.  The family member/significant other verbalizes understanding of the suicide prevention education information provided.  The family member/significant other agrees to remove the items of safety concern listed above.  Alease FrameSonya S Tahje Borawski, LCSW 07/07/2017, 4:37 PM

## 2017-07-07 NOTE — Plan of Care (Signed)
Pt. Reports sleeping okay. Pt. Compliance with medications, but has not been able to be compliant with EKG procedure this evening. Will continue to encourage compliance with standard testing EKG procedure. Pt. Denies SI/HI. Pt. Verbally contracts for safety. Pt. States she can remain safe while on the unit. Pt. Denies pain this evening. Pt. Does not attend groups. Pt. Needs reinforcement of provided education.  Progressing Activity: Imbalance in normal sleep/wake cycle will improve 07/07/2017 2245 - Progressing by Lenox PondsStevens, Semisi Biela J, RN 07/07/2017 2241 - Progressing by Lenox PondsStevens, Cissy Galbreath J, RN 07/07/2017 2238 - Progressing by Lenox PondsStevens, Aras Albarran J, RN 07/07/2017 2236 - Progressing by Lenox PondsStevens, Raquell Richer J, RN Health Behavior/Discharge Planning: Compliance with therapeutic regimen will improve 07/07/2017 2245 - Progressing by Lenox PondsStevens, Steaven Wholey J, RN 07/07/2017 2241 - Progressing by Lenox PondsStevens, Annalee Meyerhoff J, RN 07/07/2017 2238 - Progressing by Lenox PondsStevens, Paden Kuras J, RN 07/07/2017 2236 - Progressing by Lenox PondsStevens, Hermilo Dutter J, RN Safety: Ability to disclose and discuss suicidal ideas will improve 07/07/2017 2245 - Progressing by Lenox PondsStevens, Zera Markwardt J, RN 07/07/2017 2241 - Progressing by Lenox PondsStevens, Careli Luzader J, RN 07/07/2017 2238 - Progressing by Lenox PondsStevens, Rosealyn Little J, RN 07/07/2017 2236 - Progressing by Lenox PondsStevens, Preet Perrier J, RN Ability to identify and utilize support systems that promote safety will improve 07/07/2017 2236 - Progressing by Lenox PondsStevens, Halden Phegley J, RN Pain Managment: General experience of comfort will improve 07/07/2017 2245 - Progressing by Lenox PondsStevens, Rockford Leinen J, RN 07/07/2017 2241 - Progressing by Lenox PondsStevens, Myrna Vonseggern J, RN 07/07/2017 2238 - Progressing by Lenox PondsStevens, Horace Lukas J, RN 07/07/2017 2236 - Progressing by Lenox PondsStevens, Lucila Klecka J, RN

## 2017-07-07 NOTE — Progress Notes (Signed)
D:Pt denies SI/HI/AVH. Pt. Verbally contracts for safety. Pt. States she can remain safe while on the unit. Pt. Reports sleeping okay. Pt. Nutrition reports appear she is eating fair. Pt. Denies pain this evening. Pt. This evening expresses anxieties and agitations this evening that are observed. Pt. Given oral PRN medications that appeared effective. Pt. Agitations and anxieties vaguely reported at a "7" for both. Pt. Upon presentation is: demanding, anxious, intrusive, hypervigilant, and disheveled with agitations. Pt. Frequently paces impulsively and asks the same questions multiple times and asks for Ativan repeatedly. Pt. Is redirectable at times, but takes frequent redirections by staff and this Clinical research associatewriter.   A: Q x 15 minute observation checks were completed for safety. Patient was provided with education. Pt. Needs reinforcement of provided education. Patient was given scheduled/PRN medications. Patient  was encourage to attend groups, participate in unit activities and continue with plan of care.   R:Patient is complaint with medication, but does not attend groups and is not compliant with EKG procedure this evening. Will continue to encourage compliance with standard testing EKG procedure. Will update non-compliance during hand-off report.              Patient slept for Estimated Hours of 8.15; Precautionary checks every 15 minutes for safety maintained, room free of safety hazards, patient sustains no injury or falls during this shift.

## 2017-07-07 NOTE — BHH Group Notes (Signed)
  07/07/2017  Time: 1:00PM  Type of Therapy and Topic:  Group Therapy:  Feelings around Relapse and Recovery  Participation Level:  Did Not Attend   Description of Group:    Patients in this group will discuss emotions they experience before and after a relapse. They will process how experiencing these feelings, or avoidance of experiencing them, relates to having a relapse. Facilitator will guide patients to explore emotions they have related to recovery. Patients will be encouraged to process which emotions are more powerful. They will be guided to discuss the emotional reaction significant others in their lives may have to their relapse or recovery. Patients will be assisted in exploring ways to respond to the emotions of others without this contributing to a relapse.  Therapeutic Goals: 1. Patient will identify two or more emotions that lead to a relapse for them 2. Patient will identify two emotions that result when they relapse 3. Patient will identify two emotions related to recovery 4. Patient will demonstrate ability to communicate their needs through discussion and/or role plays   Summary of Patient Progress: Pt was invited to attend group but chose not to attend. CSW will continue to encourage pt to attend group throughout their admission.    Therapeutic Modalities:   Cognitive Behavioral Therapy Solution-Focused Therapy Assertiveness Training Relapse Prevention Therapy  Heidi DachKelsey Clarkson Rosselli, MSW, LCSW 07/07/2017 1:25 PM

## 2017-07-07 NOTE — Progress Notes (Signed)
Pt. Observed with increased intrusive, agitated, and anxious behavior this evening. Oral PRN medications given for patient comfort and safety. Pt. Compliant with medications given. Will follow-up with patient. Will continue to monitor.

## 2017-07-07 NOTE — Progress Notes (Signed)
Patient ID: Jeanne Hendricks, female   DOB: [birth year], 47 y.o.   MRN: [id number] CSW spoke with pt's mother, [name].  CSW provided suicide prevention education with Ms Drema Pry confirming that there are no weapons or guns in the household.  [name] informed CSW that pt had Medicaid benefits in [place] and needs to apply for benefits in [place].  She shared with CSW that she had contacted the [facility] office in [place] and requested that pt's benefits be cancelled.  CSW informed [name] that pt will need to apply for Medicaid benefits at the local DSS office.  CSW informed her that if pt cannot begin the process she can go the DSS office and obtain a consent form that pt can sign that would give her permission to start the process for her.  CSW provided [name] information about services that are provided at the [facility] in [place] that can include assistance with applying for SSDI benefits.  CSW informed [name] that an application will need to be completed and e-mailed to the [facility].  CSW informed [name] that someone from the [facility] can come to the hospital to meet with pt. [name] inquired of CSW about the name of a physician located in [facility] that specializes in treating severe anxiety with injections.  CSW informed [name] that she was unable to such a doctor, but would inquire with pt's psychiatrist about this.  CSW met with pt to discuss completing the application for the [facility]. Pt shared with CSW that she cannot complete applications.  CSW completed some of the information that could be gathered from her medical record, but informed pt that she would need to follow-up with her mother in order to get the rest of the information.  CSW informed pt that once the application is completed then a CSW will e-mail it to the [facility] on her behalf.

## 2017-07-07 NOTE — Progress Notes (Signed)
Recreation Therapy Notes  Date: 02.01.2019  Time: 9:30 AM  Location: Craft Room  Behavioral response: N/A  Intervention Topic: Leisure  Discussion/Intervention: Patient did not attend group.  Clinical Observations/Feedback:  Patient did not attend group. Sheehan Stacey LRT/CTRS         Teigen Bellin 07/07/2017 9:56 AM

## 2017-07-07 NOTE — Tx Team (Addendum)
Interdisciplinary Treatment and Diagnostic Plan Update  07/07/2017 Time of Session: 10:40 AM Jeanne Hendricks MRN: [id number]  Principal Diagnosis: Schizoaffective disorder, bipolar type (HCC)  Secondary Diagnoses: Principal Problem:   Schizoaffective disorder, bipolar type (HCC) Active Problems:   HTN (hypertension)   Diabetes (HCC)   Hypothyroidism   Suicidal ideation   OCD (obsessive compulsive disorder)   Current Medications:  Current Facility-Administered Medications  Medication Dose Route Frequency Provider Last Rate Last Dose  . acetaminophen (TYLENOL) tablet 650 mg  650 mg Oral Q6H PRN Pucilowska, Jolanta B, MD      . alum & mag hydroxide-simeth (MAALOX/MYLANTA) 200-200-20 MG/5ML suspension 30 mL  30 mL Oral Q4H PRN Pucilowska, Jolanta B, MD      . atorvastatin (LIPITOR) tablet 10 mg  10 mg Oral q1800 Pucilowska, Jolanta B, MD   10 mg at 07/06/17 1708  . chlorproMAZINE (THORAZINE) tablet 50 mg  50 mg Oral QID PRN Pucilowska, Jolanta B, MD      . divalproex (DEPAKOTE) DR tablet 500 mg  500 mg Oral Q8H Pucilowska, Jolanta B, MD   500 mg at 07/07/17 1411  . fluvoxaMINE (LUVOX) tablet 100 mg  100 mg Oral QHS Pucilowska, Jolanta B, MD   100 mg at 07/06/17 2102  . levothyroxine (SYNTHROID, LEVOTHROID) tablet 100 mcg  100 mcg Oral QAC breakfast Pucilowska, Jolanta B, MD   100 mcg at 07/07/17 0854  . LORazepam (ATIVAN) tablet 0.5 mg  0.5 mg Oral TID Pucilowska, Jolanta B, MD   0.5 mg at 07/07/17 1411  . losartan (COZAAR) tablet 50 mg  50 mg Oral Daily Pucilowska, Jolanta B, MD   50 mg at 07/07/17 0853  . magnesium hydroxide (MILK OF MAGNESIA) suspension 30 mL  30 mL Oral Daily PRN Pucilowska, Jolanta B, MD   30 mL at 07/07/17 1553  . metFORMIN (GLUCOPHAGE) tablet 500 mg  500 mg Oral BID WC Pucilowska, Jolanta B, MD   500 mg at 07/07/17 0854  . OLANZapine (ZYPREXA) tablet 30 mg  30 mg Oral QHS Pucilowska, Jolanta B, MD   30 mg at 07/06/17 2103  . temazepam (RESTORIL) capsule 30 mg  30 mg Oral  QHS Pucilowska, Jolanta B, MD   30 mg at 07/06/17 2103   PTA Medications: Medications Prior to Admission  Medication Sig Dispense Refill Last Dose  . docusate sodium (COLACE) 100 MG capsule Take 100 mg by mouth daily as needed for mild constipation.    at Unknown  . atorvastatin (LIPITOR) 10 MG tablet Take 10 mg by mouth at bedtime.   07/05/2017 at 1715  . diclofenac (VOLTAREN) 75 MG EC tablet Take 75 mg by mouth 2 (two) times daily.   07/05/2017 at 1715  . levothyroxine (SYNTHROID, LEVOTHROID) 50 MCG tablet Take 50 mcg by mouth daily before breakfast.    at Unknown  . losartan (COZAAR) 50 MG tablet Take 50 mg by mouth daily after breakfast.   07/05/2017 at 1715  . metFORMIN (GLUCOPHAGE) 500 MG tablet Take 500 mg by mouth daily before breakfast.   07/05/2017 at 1715  . mirtazapine (REMERON SOL-TAB) 30 MG disintegrating tablet Take 30 mg by mouth at bedtime.    at Unknown  . OLANZapine (ZYPREXA) 20 MG tablet Take 20 mg by mouth at bedtime.    at Unknown  . pantoprazole (PROTONIX) 40 MG tablet Take 40 mg by mouth daily.    at Unknown  . propranolol (INDERAL) 20 MG tablet Take 20 mg by mouth 2 (two) times daily.  07/05/2017 at 1715  . QUEtiapine (SEROQUEL) 25 MG tablet Take 25 mg by mouth 2 (two) times daily as needed (panic attacks).    at Unknown  . QUEtiapine (SEROQUEL) 400 MG tablet Take 400 mg by mouth at bedtime.    at Unknown  . QUEtiapine (SEROQUEL) 50 MG tablet Take 50 mg by mouth 2 (two) times daily as needed (panic attacks).    at Unknown    Patient Stressors: Health problems  Patient Strengths: Ability for insight Motivation for treatment/growth  Treatment Modalities: Medication Management, Group therapy, Case management,  1 to 1 session with clinician, Psychoeducation, Recreational therapy.   Physician Treatment Plan for Primary Diagnosis: Schizoaffective disorder, bipolar type (HCC) Long Term Goal(s): Improvement in symptoms so as ready for discharge NA   Short Term Goals:  Ability to identify changes in lifestyle to reduce recurrence of condition will improve Ability to verbalize feelings will improve Ability to disclose and discuss suicidal ideas Ability to demonstrate self-control will improve Ability to identify and develop effective coping behaviors will improve Ability to maintain clinical measurements within normal limits will improve Ability to identify triggers associated with substance abuse/mental health issues will improve NA  Medication Management: Evaluate patient's response, side effects, and tolerance of medication regimen.  Therapeutic Interventions: 1 to 1 sessions, Unit Group sessions and Medication administration.  Evaluation of Outcomes: Progressing  Physician Treatment Plan for Secondary Diagnosis: Principal Problem:   Schizoaffective disorder, bipolar type (HCC) Active Problems:   HTN (hypertension)   Diabetes (HCC)   Hypothyroidism   Suicidal ideation   OCD (obsessive compulsive disorder)  Long Term Goal(s): Improvement in symptoms so as ready for discharge NA   Short Term Goals: Ability to identify changes in lifestyle to reduce recurrence of condition will improve Ability to verbalize feelings will improve Ability to disclose and discuss suicidal ideas Ability to demonstrate self-control will improve Ability to identify and develop effective coping behaviors will improve Ability to maintain clinical measurements within normal limits will improve Ability to identify triggers associated with substance abuse/mental health issues will improve NA     Medication Management: Evaluate patient's response, side effects, and tolerance of medication regimen.  Therapeutic Interventions: 1 to 1 sessions, Unit Group sessions and Medication administration.  Evaluation of Outcomes: Progressing   RN Treatment Plan for Primary Diagnosis: Schizoaffective disorder, bipolar type (HCC) Long Term Goal(s): Knowledge of disease and  therapeutic regimen to maintain health will improve  Short Term Goals: Ability to demonstrate self-control, Ability to identify and develop effective coping behaviors will improve and Compliance with prescribed medications will improve  Medication Management: RN will administer medications as ordered by provider, will assess and evaluate patient's response and provide education to patient for prescribed medication. RN will report any adverse and/or side effects to prescribing provider.  Therapeutic Interventions: 1 on 1 counseling sessions, Psychoeducation, Medication administration, Evaluate responses to treatment, Monitor vital signs and CBGs as ordered, Perform/monitor CIWA, COWS, AIMS and Fall Risk screenings as ordered, Perform wound care treatments as ordered.  Evaluation of Outcomes: Progressing   LCSW Treatment Plan for Primary Diagnosis: Schizoaffective disorder, bipolar type (HCC) Long Term Goal(s): Safe transition to appropriate next level of care at discharge, Engage patient in therapeutic group addressing interpersonal concerns.  Short Term Goals: Engage patient in aftercare planning with referrals and resources, Increase social support and Increase skills for wellness and recovery  Therapeutic Interventions: Assess for all discharge needs, 1 to 1 time with Social worker, Explore available resources and support systems,  Assess for adequacy in community support network, Educate family and significant other(s) on suicide prevention, Complete Psychosocial Assessment, Interpersonal group therapy.  Evaluation of Outcomes: Progressing   Progress in Treatment: Attending groups: Yes. Participating in groups: Yes. Taking medication as prescribed: Yes. Toleration medication: Yes. Family/Significant other contact made: Yes, individual(s) contacted:  CSW contacted pt's mother Ledora Bottcher. Patient understands diagnosis: No. Discussing patient identified problems/goals with staff:  Yes. Medical problems stabilized or resolved: Yes. Denies suicidal/homicidal ideation: Yes. Issues/concerns per patient self-inventory: No. Other: n/a  New problem(s) identified: No, Describe:  No new problems identified  New Short Term/Long Term Goal(s): Pt's stated goal for this hospitalization is "to get better"  Discharge Plan or Barriers: Pt will be discharged back to her home with follow-up tx services at [facility] in [place], [place].  Reason for Continuation of Hospitalization: Anxiety Depression Medication stabilization  Estimated Length of Stay: 3-5 days  Recreational Therapy: Patient Stressors: Anxiety, Depression Patient Goal: Patient will identify 3 positive coping skills to use post discharge x5 days.   Attendees: Patient: Jeanne Hendricks 07/07/2017 4:31 PM  Physician: Kristine Linea, MD 07/07/2017 4:31 PM  Nursing: Leonia Reader, RN 07/07/2017 4:31 PM  RN Care Manager: 07/07/2017 4:31 PM  Social Worker: Huey Romans, LCSW 07/07/2017 4:31 PM  Recreational Therapist: Garret Reddish, LRT 07/07/2017 4:31 PM  Other:  07/07/2017 4:31 PM  Other:  07/07/2017 4:31 PM  Other: 07/07/2017 4:31 PM    Scribe for Treatment Team: Alease Frame, LCSW 07/07/2017 4:31 PM

## 2017-07-08 NOTE — [facility] Group Notes (Signed)
[facility] Group Notes:  (Nursing/MHT/Case Management/Adjunct)  Date:  07/08/2017  Time:  11:57 PM  Type of Therapy:  Group Therapy  Participation Level:  Did Not Attend  Mayra NeerJackie L Maicie Vanderloop 07/08/2017, 11:57 PM

## 2017-07-08 NOTE — Progress Notes (Signed)
[facility] MD Progress Note  07/08/2017 5:42 PM Jeanne Hendricks  MRN:  [id number]  Subjective:    Jeanne Hendricks met refused to meet with me.  She threw the covers over her head and said she wanted to sleep and to leave her alone, she didn't need anything today.  Treatment plan. We will continue Zyprexa, depakote and low dose Ativan for mood stabilization and Luvox for anxiety. Thorazine is available for agitation.   Social/disposition. She will be discharged with her mother. Follow up with [facility].  Principal Problem: Schizoaffective disorder, bipolar type ([place]) Diagnosis:   Patient Active Problem List   Diagnosis Date Noted  . OCD (obsessive compulsive disorder) [F42.9] 07/06/2017  . Schizoaffective disorder, bipolar type ([place]) [F25.0] 07/04/2017  . HTN (hypertension) [I10] 07/04/2017  . Diabetes ([place]) [E11.9] 07/04/2017  . Hypothyroidism [E03.9] 07/04/2017  . Suicidal ideation [R45.851] 07/04/2017   Total Time spent with patient: 15 minutes  Past Psychiatric History: bipolar disorder, OCD  Past Medical History:  Past Medical History:  Diagnosis Date  . Depression   . Diabetes mellitus without complication ([place])   . Hypertension   . Thyroid disease    History reviewed. No pertinent surgical history. Family History: History reviewed. No pertinent family history. Family Psychiatric  History: bipolar disorder, ADHD Social History:  Social History   Substance and Sexual Activity  Alcohol Use No  . Frequency: Never     Social History   Substance and Sexual Activity  Drug Use No    Social History   Socioeconomic History  . Marital status: Single    Spouse name: None  . Number of children: None  . Years of education: None  . Highest education level: None  Social Needs  . Financial resource strain: None  . Food insecurity - worry: None  . Food insecurity - inability: None  . Transportation needs - medical: None  . Transportation needs - non-medical: None  Occupational History   . None  Tobacco Use  . Smoking status: Former Research scientist (life sciences)  . Smokeless tobacco: Former Network engineer and Sexual Activity  . Alcohol use: No    Frequency: Never  . Drug use: No  . Sexual activity: None  Other Topics Concern  . None  Social History Narrative  . None   Additional Social History:                         Sleep: Fair  Appetite:  Fair  Current Medications: Current Facility-Administered Medications  Medication Dose Route Frequency Provider Last Rate Last Dose  . acetaminophen (TYLENOL) tablet 650 mg  650 mg Oral Q6H PRN Pucilowska, Jolanta B, MD      . alum & mag hydroxide-simeth (MAALOX/MYLANTA) 200-200-20 MG/5ML suspension 30 mL  30 mL Oral Q4H PRN Pucilowska, Jolanta B, MD      . atorvastatin (LIPITOR) tablet 10 mg  10 mg Oral q1800 Pucilowska, Jolanta B, MD   10 mg at 07/08/17 1705  . chlorproMAZINE (THORAZINE) tablet 50 mg  50 mg Oral QID PRN Pucilowska, Jolanta B, MD   50 mg at 07/08/17 1533  . divalproex (DEPAKOTE) DR tablet 500 mg  500 mg Oral Q8H Pucilowska, Jolanta B, MD   500 mg at 07/08/17 1533  . fluvoxaMINE (LUVOX) tablet 100 mg  100 mg Oral QHS Pucilowska, Jolanta B, MD   100 mg at 07/07/17 2112  . levothyroxine (SYNTHROID, LEVOTHROID) tablet 100 mcg  100 mcg Oral QAC breakfast Pucilowska, Jolanta  B, MD   100 mcg at 07/08/17 0859  . LORazepam (ATIVAN) tablet 0.5 mg  0.5 mg Oral TID Pucilowska, Jolanta B, MD   0.5 mg at 07/08/17 1705  . losartan (COZAAR) tablet 50 mg  50 mg Oral Daily Pucilowska, Jolanta B, MD   50 mg at 07/08/17 0859  . magnesium hydroxide (MILK OF MAGNESIA) suspension 30 mL  30 mL Oral Daily PRN Pucilowska, Jolanta B, MD   30 mL at 07/08/17 0903  . metFORMIN (GLUCOPHAGE) tablet 500 mg  500 mg Oral BID WC Pucilowska, Jolanta B, MD   500 mg at 07/08/17 1705  . OLANZapine (ZYPREXA) tablet 30 mg  30 mg Oral QHS Pucilowska, Jolanta B, MD   30 mg at 07/07/17 2112  . temazepam (RESTORIL) capsule 30 mg  30 mg Oral QHS Pucilowska, Jolanta  B, MD   30 mg at 07/07/17 2112    Lab Results:  No results found for this or any previous visit (from the past 48 hour(s)).  Blood Alcohol level:  Lab Results  Component Value Date   ETH <10 16/03/9603    Metabolic Disorder Labs: Lab Results  Component Value Date   HGBA1C 5.1 07/06/2017   MPG 99.67 07/06/2017   No results found for: PROLACTIN Lab Results  Component Value Date   CHOL 103 07/06/2017   TRIG 112 07/06/2017   HDL 39 (L) 07/06/2017   CHOLHDL 2.6 07/06/2017   VLDL 22 07/06/2017   LDLCALC 42 07/06/2017    Physical Findings: AIMS: Facial and Oral Movements Muscles of Facial Expression: None, normal Lips and Perioral Area: None, normal Jaw: None, normal Tongue: None, normal,Extremity Movements Upper (arms, wrists, hands, fingers): None, normal Lower (legs, knees, ankles, toes): None, normal, Trunk Movements Neck, shoulders, hips: None, normal, Overall Severity Severity of abnormal movements (highest score from questions above): None, normal Incapacitation due to abnormal movements: None, normal Patient's awareness of abnormal movements (rate only patient's report): No Awareness, Dental Status Current problems with teeth and/or dentures?: No Does patient usually wear dentures?: No  CIWA:    COWS:     Musculoskeletal: Strength & Muscle Tone: within normal limits Gait & Station: normal Patient leans: N/A  Psychiatric Specialty Exam: Physical Exam  Nursing note and vitals reviewed. Psychiatric: Her speech is normal. Her mood appears anxious. Her affect is labile and inappropriate. She is withdrawn. Thought content is paranoid. Cognition and memory are normal. She expresses impulsivity. She expresses suicidal ideation.    Review of Systems  Neurological: Negative.   Psychiatric/Behavioral: Positive for suicidal ideas. The patient is nervous/anxious.   All other systems reviewed and are negative.   Blood pressure 112/72, pulse 99, temperature 98.8 F  (37.1 C), temperature source Oral, resp. rate 18, height '5\' 5"'$  (1.651 m), weight 66.2 kg (146 lb), SpO2 100 %.Body mass index is 24.3 kg/m.  General Appearance: Disheveled  Eye Contact:  Good  Speech:  Clear and Coherent  Volume:  Normal  Mood:  Irritable  Affect:  Congruent  Thought Process:  Disorganized and Descriptions of Associations: Tangential  Orientation:  Full (Time, Place, and Person)  Thought Content:  WDL  Suicidal Thoughts:  No  Homicidal Thoughts:  No  Memory:  Immediate;   Poor Recent;   Poor Remote;   Poor  Judgement:  Poor  Insight:  Lacking  Psychomotor Activity:  Normal  Concentration:  Concentration: Poor and Attention Span: Poor  Recall:  Poor  Fund of Knowledge:  Fair  Language:  Fair  Akathisia:  No  Handed:  Right  AIMS (if indicated):     Assets:  Communication Skills Desire for Improvement Housing Physical Health Resilience Social Support  ADL's:  Intact  Cognition:  WNL  Sleep:  Number of Hours: 7     Treatment Plan Summary: Daily contact with patient to assess and evaluate symptoms and progress in treatment and Medication management   Jeanne Hendricks is a 47 year old female with a history of bipolar disorder admitted for psychotic break.  #Suicidal ideation -patient able to contract for safety  #Mood and psychosis -continue Zyprexa 30 mg nightly -continue Depakote 500 mg TID -continue Restoril 30 mg nightly -continue Ativan 0.5 mg TID  #OCD -continue Luvox 100 mg nightly  #Hypothyroidism -increase Synthroid 100 mcg  #HTN -Cozaar 50 mg daily  #Dyslipidemia -Lipitor 10 mg daily  #Metabolic syndrome monitoring -Lipid panel, TSH, HgbA1C are unremarkable -EKG, pending will f/u  -pregnancy test is negative  #Disposition -discharge with the mother -follow up with [facility]     Jolene Schimke, MD 07/08/2017, 5:42 PM

## 2017-07-08 NOTE — [facility] Group Notes (Signed)
[facility] Group Notes:  (Nursing/MHT/Case Management/Adjunct)  Date:  07/08/2017  Time:  5:08 AM  Type of Therapy:  Psychoeducational Skills  Participation Level:  Did Not Attend   Summary of Progress/Problems:  Chancy MilroyLaquanda Y Michelyn Scullin 07/08/2017, 5:08 AM

## 2017-07-08 NOTE — Plan of Care (Signed)
Patient is alert and oriented. Patient denies SI, HI and AVH. Patient complains of having anxiety, PRN medication (thorazine)  helps with managing anxiety and agitation. Patient has been in room during the morning resting. Nurse will continue to monitor. Safety checks Q15 minutes.

## 2017-07-08 NOTE — [facility] Group Notes (Signed)
LCSW Group Therapy Note  07/08/2017 1:15pm  Type of Therapy and Topic: Group Therapy: Holding on to Grudges   Participation Level: Did Not Attend   Description of Group:  In this group patients will be asked to explore and define a grudge. Patients will be guided to discuss their thoughts, feelings, and reasons as to why people have grudges. Patients will process the impact grudges have on daily life and identify thoughts and feelings related to holding grudges. Facilitator will challenge patients to identify ways to let go of grudges and the benefits this provides. Patients will be confronted to address why one struggles letting go of grudges. Lastly, patients will identify feelings and thoughts related to what life would look like without grudges. This group will be process-oriented, with patients participating in exploration of their own experiences, giving and receiving support, and processing challenge from other group members.  Therapeutic Goals:  1. Patient will identify specific grudges related to their personal life.  2. Patient will identify feelings, thoughts, and beliefs around grudges.  3. Patient will identify how one releases grudges appropriately.  4. Patient will identify situations where they could have let go of the grudge, but instead chose to hold on.   Summary of Patient Progress:   Therapeutic Modalities:  Cognitive Behavioral Therapy  Solution Focused Therapy  Motivational Interviewing  Brief Therapy   Ayvah Caroll  CUEBAS-COLON, LCSW 07/08/2017 12:53 PM

## 2017-07-09 NOTE — Progress Notes (Signed)
D:Pt denies SI/HI/AVH. Pt. Verbally contracts for safety. Pt. States she can remain safe while on the unit. Pt. Reports feeling, "better" today then she did yesterday. Pt. Reports sleeping, "good". Pt. Not eating much today. Pt. Upon interaction with staff and peers can be: attention-seeking, childlike, demanding, and intrusive, but will answer assessment questions when asked. Pt. Can be tangential at times as well. Pt. Reports no complaints this evening. Pt. Presents with what could be described as an apathetic/flat/blunted presentation. Pt. Does not attend to hygiene this evening and appears very disheveled with body odor. Pt. Encouraged to attend to hygiene needs, but refuses. Pt. Frequently up at the nursing stations requiring redirection. Pt. Continues to ask the same questions multiple times over, despite questions answered to patient's reported satisfaction.   A: Q x 15 minute observation checks were completed for safety. Patient was provided with education. Pt. Needs reinforcement on provided education provided. Patient was given scheduled medications. Patient  was encourage to attend groups, participate in unit activities and continue with plan of care.   R:Patient is complaint with medication and unit procedures, but does not attends groups.              Patient slept for Estimated Hours of 8; Precautionary checks every 15 minutes for safety maintained, room free of safety hazards, patient sustains no injury or falls during this shift.

## 2017-07-09 NOTE — Plan of Care (Signed)
Pt. Reports feeling, "better" today then she did yesterday. Pt. Denies SI/HI. Pt. Verbally contracts for safety. Pt. States she can remain safe while on the unit. Pt. Reports sleeping, "good". Pt. Compliant with medications and unit procedures this evening. Pt. Needs reinforcement on provided education provided. Pt. Denies pain. Pt. Not progressing in attending to hygiene this evening and is very disheveled. Pt. Does not go to groups.  Progressing Activity: Imbalance in normal sleep/wake cycle will improve 07/09/2017 2230 - Progressing by Lenox PondsStevens, Delenn Ahn J, RN Coping: Ability to verbalize feelings will improve 07/09/2017 2230 - Progressing by Lenox PondsStevens, Yashika Mask J, RN Health Behavior/Discharge Planning: Compliance with therapeutic regimen will improve 07/09/2017 2230 - Progressing by Lenox PondsStevens, Masiah Lewing J, RN Safety: Ability to disclose and discuss suicidal ideas will improve 07/09/2017 2230 - Progressing by Lenox PondsStevens, Amilyah Nack J, RN Pain Managment: General experience of comfort will improve 07/09/2017 2230 - Progressing by Lenox PondsStevens, Tel Hevia J, RN   Not Progressing Spiritual Needs Ability to function at adequate level 07/09/2017 2230 - Not Progressing by Lenox PondsStevens, Jean Skow J, RN Activity: Interest or engagement in leisure activities will improve 07/09/2017 2230 - Not Progressing by Lenox PondsStevens, Irvine Glorioso J, RN Education: Knowledge of the prescribed therapeutic regimen will improve 07/09/2017 2230 - Not Progressing by Lenox PondsStevens, Nirvi Boehler J, RN [facility] Participation in Recreation Therapeutic Interventions [facility]-Other Recreation Therapy Goal (Specify) Description Patient will identify 3 positive coping skills to use post discharge x5 days.  07/09/2017 2230 - Not Progressing by Lenox PondsStevens, Janah Mcculloh J, RN

## 2017-07-09 NOTE — [facility] Group Notes (Signed)
LCSW Group Therapy Note 07/09/2017 1:15pm  Type of Therapy and Topic: Group Therapy: Feelings Around Returning Home & Establishing a Supportive Framework and Supporting Oneself When Supports Not Available  Participation Level: Did Not Attend  Description of Group:  Patients first processed thoughts and feelings about upcoming discharge. These included fears of upcoming changes, lack of change, new living environments, judgements and expectations from others and overall stigma of mental health issues. The group then discussed the definition of a supportive framework, what that looks and feels like, and how do to discern it from an unhealthy non-supportive network. The group identified different types of supports as well as what to do when your family/friends are less than helpful or unavailable  Therapeutic Goals  1. Patient will identify one healthy supportive network that they can use at discharge. 2. Patient will identify one factor of a supportive framework and how to tell it from an unhealthy network. 3. Patient able to identify one coping skill to use when they do not have positive supports from others. 4. Patient will demonstrate ability to communicate their needs through discussion and/or role plays.  Summary of Patient Progress:  Patient was encouraged and invited to attend group. Patient did not attend group. Social worker will continue to encourage group participation in the future.    Therapeutic Modalities Cognitive Behavioral Therapy Motivational Interviewing   Cristie Mckinney  CUEBAS-COLON, LCSW 07/09/2017 12:05 PM

## 2017-07-09 NOTE — Progress Notes (Signed)
[facility] MD Progress Note  07/09/2017 6:37 PM Jeanne Hendricks  MRN:  [id number]  Subjective:    Jeanne Hendricks again refused to meet with me.  She refused to open her eyes.  However I did see her walking around the unit.  Per nursing she has been very childlike, knocking on their door a lot and asking the same question multiple times.  Treatment plan. We will continue Zyprexa, depakote and low dose Ativan for mood stabilization and Luvox for anxiety. Thorazine is available for agitation.   Social/disposition. She will be discharged with her mother. Follow up with [facility].  Principal Problem: Schizoaffective disorder, bipolar type (HCC) Diagnosis:   Patient Active Problem List   Diagnosis Date Noted  . OCD (obsessive compulsive disorder) [F42.9] 07/06/2017  . Schizoaffective disorder, bipolar type (HCC) [F25.0] 07/04/2017  . HTN (hypertension) [I10] 07/04/2017  . Diabetes (HCC) [E11.9] 07/04/2017  . Hypothyroidism [E03.9] 07/04/2017  . Suicidal ideation [R45.851] 07/04/2017   Total Time spent with patient: 15 minutes  Past Psychiatric History: bipolar disorder, OCD  Past Medical History:  Past Medical History:  Diagnosis Date  . Depression   . Diabetes mellitus without complication (HCC)   . Hypertension   . Thyroid disease    History reviewed. No pertinent surgical history. Family History: History reviewed. No pertinent family history. Family Psychiatric  History: bipolar disorder, ADHD Social History:  Social History   Substance and Sexual Activity  Alcohol Use No  . Frequency: Never     Social History   Substance and Sexual Activity  Drug Use No    Social History   Socioeconomic History  . Marital status: Single    Spouse name: None  . Number of children: None  . Years of education: None  . Highest education level: None  Social Needs  . Financial resource strain: None  . Food insecurity - worry: None  . Food insecurity - inability: None  . Transportation needs -  medical: None  . Transportation needs - non-medical: None  Occupational History  . None  Tobacco Use  . Smoking status: Former Games developer  . Smokeless tobacco: Former Engineer, water and Sexual Activity  . Alcohol use: No    Frequency: Never  . Drug use: No  . Sexual activity: None  Other Topics Concern  . None  Social History Narrative  . None   Additional Social History:                         Sleep: Fair  Appetite:  Fair  Current Medications: Current Facility-Administered Medications  Medication Dose Route Frequency Provider Last Rate Last Dose  . acetaminophen (TYLENOL) tablet 650 mg  650 mg Oral Q6H PRN Pucilowska, Jolanta B, MD      . alum & mag hydroxide-simeth (MAALOX/MYLANTA) 200-200-20 MG/5ML suspension 30 mL  30 mL Oral Q4H PRN Pucilowska, Jolanta B, MD      . atorvastatin (LIPITOR) tablet 10 mg  10 mg Oral q1800 Pucilowska, Jolanta B, MD   10 mg at 07/09/17 1711  . chlorproMAZINE (THORAZINE) tablet 50 mg  50 mg Oral QID PRN Pucilowska, Jolanta B, MD   50 mg at 07/08/17 1533  . divalproex (DEPAKOTE) DR tablet 500 mg  500 mg Oral Q8H Pucilowska, Jolanta B, MD   500 mg at 07/09/17 1516  . fluvoxaMINE (LUVOX) tablet 100 mg  100 mg Oral QHS Pucilowska, Jolanta B, MD   100 mg at 07/08/17 2112  .  levothyroxine (SYNTHROID, LEVOTHROID) tablet 100 mcg  100 mcg Oral QAC breakfast Pucilowska, Jolanta B, MD   100 mcg at 07/09/17 0923  . LORazepam (ATIVAN) tablet 0.5 mg  0.5 mg Oral TID Pucilowska, Jolanta B, MD   0.5 mg at 07/09/17 1710  . losartan (COZAAR) tablet 50 mg  50 mg Oral Daily Pucilowska, Jolanta B, MD   50 mg at 07/09/17 0922  . magnesium hydroxide (MILK OF MAGNESIA) suspension 30 mL  30 mL Oral Daily PRN Pucilowska, Jolanta B, MD   30 mL at 07/08/17 0903  . metFORMIN (GLUCOPHAGE) tablet 500 mg  500 mg Oral BID WC Pucilowska, Jolanta B, MD   500 mg at 07/09/17 1710  . OLANZapine (ZYPREXA) tablet 30 mg  30 mg Oral QHS Pucilowska, Jolanta B, MD   30 mg at  07/08/17 2112  . temazepam (RESTORIL) capsule 30 mg  30 mg Oral QHS Pucilowska, Jolanta B, MD   30 mg at 07/08/17 2112    Lab Results:  No results found for this or any previous visit (from the past 48 hour(s)).  Blood Alcohol level:  Lab Results  Component Value Date   ETH <10 07/03/2017    Metabolic Disorder Labs: Lab Results  Component Value Date   HGBA1C 5.1 07/06/2017   MPG 99.67 07/06/2017   No results found for: PROLACTIN Lab Results  Component Value Date   CHOL 103 07/06/2017   TRIG 112 07/06/2017   HDL 39 (L) 07/06/2017   CHOLHDL 2.6 07/06/2017   VLDL 22 07/06/2017   LDLCALC 42 07/06/2017    Physical Findings: AIMS: Facial and Oral Movements Muscles of Facial Expression: None, normal Lips and Perioral Area: None, normal Jaw: None, normal Tongue: None, normal,Extremity Movements Upper (arms, wrists, hands, fingers): None, normal Lower (legs, knees, ankles, toes): None, normal, Trunk Movements Neck, shoulders, hips: None, normal, Overall Severity Severity of abnormal movements (highest score from questions above): None, normal Incapacitation due to abnormal movements: None, normal Patient's awareness of abnormal movements (rate only patient's report): No Awareness, Dental Status Current problems with teeth and/or dentures?: No Does patient usually wear dentures?: No  CIWA:    COWS:     Musculoskeletal: Strength & Muscle Tone: within normal limits Gait & Station: normal Patient leans: N/A  Psychiatric Specialty Exam: Physical Exam  Nursing note and vitals reviewed. Psychiatric: Her speech is normal. Her mood appears anxious. Her affect is labile and inappropriate. She is withdrawn. Thought content is paranoid. Cognition and memory are normal. She expresses impulsivity. She expresses suicidal ideation.    Review of Systems  Neurological: Negative.   Psychiatric/Behavioral: Positive for suicidal ideas. The patient is nervous/anxious.   All other systems  reviewed and are negative.   Blood pressure 107/77, pulse 84, temperature 98 F (36.7 C), temperature source Oral, resp. rate 18, height 5\' 5"  (1.651 m), weight 66.2 kg (146 lb), SpO2 99 %.Body mass index is 24.3 kg/m.  General Appearance: Disheveled  Eye Contact:  Good  Speech:  Clear and Coherent  Volume:  Normal  Mood:  Irritable  Affect:  Congruent  Thought Process:  Disorganized and Descriptions of Associations: Tangential  Orientation:  Full (Time, Place, and Person)  Thought Content:  WDL  Suicidal Thoughts:  No  Homicidal Thoughts:  No  Memory:  Immediate;   Poor Recent;   Poor Remote;   Poor  Judgement:  Poor  Insight:  Lacking  Psychomotor Activity:  Normal  Concentration:  Concentration: Poor and Attention Span: Poor  Recall:  Poor  Fund of Knowledge:  Fair  Language:  Fair  Akathisia:  No  Handed:  Right  AIMS (if indicated):     Assets:  Communication Skills Desire for Improvement Housing Physical Health Resilience Social Support  ADL's:  Intact  Cognition:  WNL  Sleep:  Number of Hours: 7.5     Treatment Plan Summary: Daily contact with patient to assess and evaluate symptoms and progress in treatment and Medication management   Ms. Jeanne Hendricks is a 47 year old female with a history of bipolar disorder admitted for psychotic break.  #Suicidal ideation -patient able to contract for safety  #Mood and psychosis -continue Zyprexa 30 mg nightly -continue Depakote 500 mg TID -continue Restoril 30 mg nightly -continue Ativan 0.5 mg TID  #OCD -continue Luvox 100 mg nightly  #Hypothyroidism -increase Synthroid 100 mcg  #HTN -Cozaar 50 mg daily  #Dyslipidemia -Lipitor 10 mg daily  #Metabolic syndrome monitoring -Lipid panel, TSH, HgbA1C are unremarkable -EKG, pending will f/u  -pregnancy test is negative  #Disposition -discharge with the mother -[facility]     Cindee LameLauren M Zyan Coby, MD 07/09/2017, 6:37 PM

## 2017-07-09 NOTE — Plan of Care (Signed)
  Spiritual Needs Ability to function at adequate level 07/09/2017 1751 - Not Progressing by Elige Radonobb, Velma Agnes B, RN Note Incontinent of stool.  Poor hygiene.   07/09/2017 1744 - Not Progressing by Elige Radonobb, Janye Maynor B, RN Note Poor hygiene.  Notable body odor.  Slept through breakfast and lunch.  No group attendance.     Activity: Interest or engagement in leisure activities will improve 07/09/2017 1751 - Not Progressing by Elige Radonobb, Nazariah Cadet B, RN Note Sleeping the majority of day.  Only up for medications.  Did not eat lunch or dinner.   07/09/2017 1744 - Not Progressing by Elige Radonobb, Donna Silverman B, RN Note Isolates to room .   Imbalance in normal sleep/wake cycle will improve 07/09/2017 1751 - Not Progressing by Elige Radonobb, Quinn Bartling B, RN Note Sleeping all day.   07/09/2017 1744 - Not Progressing by Elige Radonobb, Deyonte Cadden B, RN Note Sleeping majority of day.    Education: Utilization of techniques to improve thought processes will improve 07/09/2017 1751 - Not Progressing by Elige Radonobb, Abbigal Radich B, RN Note Thought processes disorganized.   07/09/2017 1744 - Not Progressing by Elige Radonobb, Stormie Ventola B, RN Note Disorganized thought processes.   Knowledge of the prescribed therapeutic regimen will improve 07/09/2017 1751 - Progressing by Elige Radonobb, Reyanne Hussar B, RN Note Understands medications and what they are for.  07/09/2017 1744 - Progressing by Elige Radonobb, Koichi Platte B, RN Note Understands medications    Coping: Ability to cope will improve 07/09/2017 1751 - Not Progressing by Elige Radonobb, Lucresha Dismuke B, RN Note Unable to verbalize coping skills.  Relies on ativan and request it persistently.   07/09/2017 1744 - Not Progressing by Elige Radonobb, Kameah Rawl B, RN Note Consistently requests for ativan Ability to verbalize feelings will improve 07/09/2017 1751 - Progressing by Elige Radonobb, Payten Hobin B, RN Note Verbalizes feelings  07/09/2017 1744 - Not Progressing by Elige Radonobb, Mackenzi Krogh B, RN Note Difficulty expressing feelings.     Health Behavior/Discharge Planning: Ability to make  decisions will improve 07/09/2017 1751 - Not Progressing by Elige Radonobb, Mairely Foxworth B, RN Note indecisive 07/09/2017 1744 - Not Progressing by Elige Radonobb, Colin Ellers B, RN Compliance with therapeutic regimen will improve 07/09/2017 1751 by Elige Radonobb, Kymberlie Brazeau B, RN Note Nog group attendance.  Medication compliant 07/09/2017 1744 - Progressing by Elige Radonobb, Syris Brookens B, RN Note Medication compliant.     Health Behavior/Discharge Planning: Ability to make decisions will improve 07/09/2017 1744 - Not Progressing by Elige Radonobb, Tymesha Ditmore B, RN   Safety: Ability to disclose and discuss suicidal ideas will improve 07/09/2017 1751 - Progressing by Elige Radonobb, Aeon Koors B, RN Note Denies SI 07/09/2017 1744 - Progressing by Elige Radonobb, Steph Cheadle B, RN Note Denies SI   Role Relationship: Ability to demonstrate positive changes in social behaviors and relationships will improve 07/09/2017 1751 - Not Progressing by Elige Radonobb, Janeliz Prestwood B, RN 07/09/2017 1744 - Not Progressing by Elige Radonobb, Gennette Shadix B, RN Note Patient is intrusive, needy and demanding.  Verbalizes that she does not know how to change.     Self-Concept: Ability to verbalize positive feelings about self will improve 07/09/2017 1751 - Not Progressing by Elige Radonobb, Tiffany Talarico B, RN Note Unable to give one positive comment about self 07/09/2017 1744 - Not Progressing by Elige Radonobb, Azora Bonzo B, RN Note Only negative thoughts verablized

## 2017-07-09 NOTE — Plan of Care (Signed)
  Spiritual Needs Ability to function at adequate level 07/09/2017 1744 - Not Progressing by Elige Radonobb, Aubert Choyce B, RN Note Poor hygiene.  Notable body odor.  Slept through breakfast and lunch.  No group attendance.     Activity: Interest or engagement in leisure activities will improve 07/09/2017 1744 - Not Progressing by Elige Radonobb, Celsey Asselin B, RN Note Isolates to room .   Imbalance in normal sleep/wake cycle will improve 07/09/2017 1744 - Not Progressing by Elige Radonobb, Sufyaan Palma B, RN Note Sleeping majority of day.    Education: Utilization of techniques to improve thought processes will improve 07/09/2017 1744 - Not Progressing by Elige Radonobb, Otis Burress B, RN Note Disorganized thought processes.   Knowledge of the prescribed therapeutic regimen will improve 07/09/2017 1744 - Progressing by Elige Radonobb, Kinlee Garrison B, RN Note Understands medications    Coping: Ability to cope will improve 07/09/2017 1744 - Not Progressing by Elige Radonobb, Charise Leinbach B, RN Note Consistently requests for ativan Ability to verbalize feelings will improve 07/09/2017 1744 - Not Progressing by Elige Radonobb, Jeidi Gilles B, RN Note Difficulty expressing feelings.     Health Behavior/Discharge Planning: Ability to make decisions will improve 07/09/2017 1744 - Not Progressing by Elige Radonobb, Alaiya Martindelcampo B, RN Compliance with therapeutic regimen will improve 07/09/2017 1744 - Progressing by Elige Radonobb, Ayvion Kavanagh B, RN Note Medication compliant.     Role Relationship: Ability to demonstrate positive changes in social behaviors and relationships will improve 07/09/2017 1744 - Not Progressing by Elige Radonobb, Bobbyjo Marulanda B, RN Note Patient is intrusive, needy and demanding.  Verbalizes that she does not know how to change.     Safety: Ability to disclose and discuss suicidal ideas will improve 07/09/2017 1744 - Progressing by Elige Radonobb, Kella Splinter B, RN Note Denies SI   Self-Concept: Ability to verbalize positive feelings about self will improve 07/09/2017 1744 - Not Progressing by Elige Radonobb, Alizzon Dioguardi B,  RN Note Only negative thoughts verablized

## 2017-07-10 LAB — VALPROIC ACID LEVEL: Valproic Acid Lvl: 123 ug/mL — ABNORMAL HIGH (ref 50.0–100.0)

## 2017-07-10 LAB — AMMONIA: Ammonia: 35 umol/L (ref 9–35)

## 2017-07-10 MED ORDER — DIVALPROEX SODIUM 500 MG PO DR TAB
500.0000 mg | DELAYED_RELEASE_TABLET | Freq: Two times a day (BID) | ORAL | Status: DC
  Administered 2017-07-10 – 2017-07-20 (×20): 500 mg via ORAL
  Filled 2017-07-10 (×22): qty 1

## 2017-07-10 MED ORDER — DIVALPROEX SODIUM 500 MG PO DR TAB: 500.0000 mg | DELAYED_RELEASE_TABLET | Freq: Two times a day (BID) | ORAL | Status: DC

## 2017-07-10 MED ORDER — TEMAZEPAM 15 MG PO CAPS
15.0000 mg | ORAL_CAPSULE | Freq: Every day | ORAL | Status: DC
  Administered 2017-07-10 – 2017-07-15 (×6): 15 mg via ORAL
  Filled 2017-07-10 (×6): qty 1

## 2017-07-10 NOTE — Plan of Care (Signed)
Patient in bed sleeping with eyes closed until later this morning. Denies pain at this time.  Q x 15 minute observation checks were completed for safety. Patient was provided with education. Pt. needs reinforcement on provided education provided.  Patient was given scheduled medications. Patient  was encourage to attend groups, participate in unit activities and continue with plan of care.

## 2017-07-10 NOTE — Progress Notes (Signed)
Recreation Therapy Notes   Date: 02.04.2019  Time: 9:30 AM  Location: Craft Room  Behavioral response: N/A  Intervention Topic: Goals  Discussion/Intervention: Patient did not attend group.  Clinical Observations/Feedback:  Patient did not attend group.  Derran Sear LRT/CTRS         Kotaro Buer 07/10/2017 10:32 AM

## 2017-07-10 NOTE — Progress Notes (Signed)
[facility]  07/10/2017 12:59 PM Jeanne Hendricks  MRN:  [id number]  Subjective:    Jeanne Hendricks reports feeling "great" and tells me that family visite went well on the weekend. She has been able to sleep. She is apparently sedated from medications but has been asking for Ativan constantly. Complains of diarrhea but received laxative.   Spoke with the mother who visited briefly but found the patient too sedated to talk. She is asking for [facility]. She lives close by. She needs help with disability application and Medicaid. Patient's 48 year old sone is with his uncle in [place] and will not be  Accepted to school there until the patient signs her permission in person.  Treatment plan. We will continue Zyprexa 30 mg nightly, Luvox 100 mg nightly. We lower Depakote to 500 mg BID as VPA level is 123 with ammonia 35. We lower Restoril to 15 mg nightly and discontinue Ativan.  Social/disposition. Discharge to home with the mother. [facility]  Principal Problem: Schizoaffective disorder, bipolar type (HCC) Diagnosis:   Patient Active Problem List   Diagnosis Date Noted  . Schizoaffective disorder, bipolar type (HCC) [F25.0] 07/04/2017    Priority: High  . OCD (obsessive compulsive disorder) [F42.9] 07/06/2017  . HTN (hypertension) [I10] 07/04/2017  . Diabetes (HCC) [E11.9] 07/04/2017  . Hypothyroidism [E03.9] 07/04/2017  . Suicidal ideation [R45.851] 07/04/2017   Total Time spent with patient: 30 minutes  Past Psychiatric History: bipolar disorder  Past Medical History:  Past Medical History:  Diagnosis Date  . Depression   . Diabetes mellitus without complication (HCC)   . Hypertension   . Thyroid disease    History reviewed. No pertinent surgical history. Family History: History reviewed. No pertinent family history. Family Psychiatric  History: bipolar disorder Social History:  Social History   Substance and Sexual Activity  Alcohol Use No  .  Frequency: Never     Social History   Substance and Sexual Activity  Drug Use No    Social History   Socioeconomic History  . Marital status: Single    Spouse name: None  . Number of children: None  . Years of education: None  . Highest education level: None  Social Needs  . Financial resource strain: None  . Food insecurity - worry: None  . Food insecurity - inability: None  . Transportation needs - medical: None  . Transportation needs - non-medical: None  Occupational History  . None  Tobacco Use  . Smoking status: Former Games developermoker  . Smokeless tobacco: Former Engineer, waterUser  Substance and Sexual Activity  . Alcohol use: No    Frequency: Never  . Drug use: No  . Sexual activity: None  Other Topics Concern  . None  Social History Narrative  . None   Additional Social History:                         Sleep: Fair  Appetite:  Fair  Current Medications: Current Facility-Administered Medications  Medication Dose Route Frequency Provider Last Rate Last Dose  . acetaminophen (TYLENOL) tablet 650 mg  650 mg Oral Q6H PRN Deiondre Harrower B, MD      . alum & mag hydroxide-simeth (MAALOX/MYLANTA) 200-200-20 MG/5ML suspension 30 mL  30 mL Oral Q4H PRN Taavi Hoose B, MD      . atorvastatin (LIPITOR) tablet 10 mg  10 mg Oral q1800 Carmello Cabiness B, MD   10 mg  at 07/09/17 1711  . divalproex (DEPAKOTE) DR tablet 500 mg  500 mg Oral Q12H Keah Lamba B, MD      . fluvoxaMINE (LUVOX) tablet 100 mg  100 mg Oral QHS Issachar Broady B, MD   100 mg at 07/09/17 2103  . levothyroxine (SYNTHROID, LEVOTHROID) tablet 100 mcg  100 mcg Oral QAC breakfast Teasia Zapf B, MD   100 mcg at 07/09/17 0923  . losartan (COZAAR) tablet 50 mg  50 mg Oral Daily Halea Lieb B, MD   50 mg at 07/09/17 0922  . magnesium hydroxide (MILK OF MAGNESIA) suspension 30 mL  30 mL Oral Daily PRN Bastion Bolger B, MD   30 mL at 07/08/17 0903  . metFORMIN (GLUCOPHAGE)  tablet 500 mg  500 mg Oral BID WC Mete Purdum B, MD   500 mg at 07/10/17 0824  . OLANZapine (ZYPREXA) tablet 30 mg  30 mg Oral QHS Aeva Posey B, MD   30 mg at 07/09/17 2103  . temazepam (RESTORIL) capsule 15 mg  15 mg Oral QHS Trayce Caravello B, MD        Lab Results:  Results for orders placed or performed during the hospital encounter of 07/05/17 (from the past 48 hour(s))  Valproic acid level     Status: Abnormal   Collection Time: 07/10/17  7:08 AM  Result Value Ref Range   Valproic Acid Lvl 123 (H) 50.0 - 100.0 ug/mL    Comment: Performed at [facility], [address] [place], [place] [address]  Ammonia     Status: None   Collection Time: 07/10/17  7:08 AM  Result Value Ref Range   Ammonia 35 9 - 35 umol/L    Comment: Performed at [facility], [address] [place], [place] [address]    Blood Alcohol level:  Lab Results  Component Value Date   [facility] <10 07/03/2017    Metabolic Disorder Labs: Lab Results  Component Value Date   HGBA1C 5.1 07/06/2017   MPG 99.67 07/06/2017   No results found for: PROLACTIN Lab Results  Component Value Date   CHOL 103 07/06/2017   TRIG 112 07/06/2017   HDL 39 (L) 07/06/2017   CHOLHDL 2.6 07/06/2017   VLDL 22 07/06/2017   LDLCALC 42 07/06/2017    Physical Findings: AIMS: Facial and Oral Movements Muscles of Facial Expression: None, normal Lips and Perioral Area: None, normal Jaw: None, normal Tongue: None, normal,Extremity Movements Upper (arms, wrists, hands, fingers): None, normal Lower (legs, knees, ankles, toes): None, normal, Trunk Movements Neck, shoulders, hips: None, normal, Overall Severity Severity of abnormal movements (highest score from questions above): None, normal Incapacitation due to abnormal movements: None, normal Patient's awareness of abnormal movements (rate only patient's report): No Awareness, Dental Status Current problems with teeth and/or dentures?:  No Does patient usually wear dentures?: No  CIWA:    COWS:     Musculoskeletal: Strength & Muscle Tone: within normal limits Gait & Station: normal Patient leans: N/A  Psychiatric Specialty Exam: Physical Exam  Nursing note and vitals reviewed. Psychiatric: Her affect is blunt. Her speech is rapid and/or pressured. She is withdrawn. Thought content is paranoid. Cognition and memory are impaired. She expresses impulsivity.    Review of Systems  Neurological: Negative.   Psychiatric/Behavioral: The patient is nervous/anxious.   All other systems reviewed and are negative.   Blood pressure 99/60, pulse 98, temperature 98.6 F (37 C), temperature source Oral, resp. rate 16, height 5\' 5"  (1.651 m),  weight 66.2 kg (146 lb), SpO2 99 %.Body mass index is 24.3 kg/m.  General Appearance: Disheveled  Eye Contact:  Good  Speech:  Clear and Coherent  Volume:  Normal  Mood:  Anxious  Affect:  Congruent  Thought Process:  Goal Directed and Descriptions of Associations: Intact  Orientation:  Full (Time, Place, and Person)  Thought Content:  Delusions  Suicidal Thoughts:  No  Homicidal Thoughts:  No  Memory:  Immediate;   Fair Recent;   Fair Remote;   Fair  Judgement:  Poor  Insight:  Lacking  Psychomotor Activity:  Increased  Concentration:  Concentration: Fair and Attention Span: Fair  Recall:  [employer] of Knowledge:  Fair  Language:  Fair  Akathisia:  No  Handed:  Right  AIMS (if indicated):     Assets:  Communication Skills Desire for Improvement Housing Physical Health Resilience Social Support  ADL's:  Intact  Cognition:  WNL  Sleep:  Number of Hours: 7.5     Treatment Plan Summary: Daily contact with patient to assess and evaluate symptoms and progress in treatment and Medication management   Ms. Jeanne Hendricks is a 47 year old female with a history of bipolar disorder admitted for psychotic break.  #Suicidal ideation -patient able to contract for safety  #Mood and  psychosis -continue Zyprexa 30 mg nightly -decrease Depakote 500 mg BID -decrease Restoril to 15 mg nightly -discontinue Ativan  #OCD -continue Luvox 100 mg nightly  #Hypothyroidism -increase Synthroid 100 mcg  #HTN -Cozaar 50 mg daily  #Dyslipidemia -Lipitor 10 mg daily  #Metabolic syndrome monitoring -Lipid panel, TSH, HgbA1C are unremarkable -EKG, pending will f/u  -pregnancy test is negative  #Disposition -discharge with the mother -follow up with [facility]    Kristine Linea, MD 07/10/2017, 12:59 PM

## 2017-07-10 NOTE — Progress Notes (Signed)
Patient very sedated and difficult to arouse this afternoon. 1200 Ativan held d/t increased sedation. Meal tray brought to room.

## 2017-07-10 NOTE — Progress Notes (Signed)
Patient in bed sleeping with eyes closed until later this morning. Denies pain at this time.  Q x 15 minute observation checks were completed for safety. Patient was provided with education. Pt. needs reinforcement on provided education provided.  Patient was given scheduled medications. Patient  was encourage to attend groups, participate in unit activities and continue with plan of care.  

## 2017-07-11 NOTE — Progress Notes (Signed)
Patient ID: Jeanne Hendricks, female   DOB: June 29, 1970, 47 y.o.   MRN: [id number] CSW assisted Pt in meeting with her mother and two sons so that they may complete some paperwork to have younger son enrolled in school properly. He had not been able to get enrolled without the Pt's proper consent and there would be consequences for them if he did not get it signed. Pt completed paperwork and spoke with family briefly and they were escorted out. CSW advised [name] on getting disability application completed.  She verbalizes being confident in being able to navigate the system and is agreeable one working on this on Pt's behalf.  Jake SharkSara Abir Craine, LCSW

## 2017-07-11 NOTE — Progress Notes (Signed)
Recreation Therapy Notes  Date: 02.05.2019  Time: 9:30 am  Location: Craft Room  Behavioral response: N/A  Intervention Topic: Stress  Discussion/Intervention: Patient did not attend group.  Clinical Observations/Feedback: Patient did not attend group.  Tyna Huertas LRT/CTRS         Ziyan Hillmer 07/11/2017 11:05 AM

## 2017-07-11 NOTE — [facility] Group Notes (Signed)
[facility] Group Notes:  (Nursing/MHT/Case Management/Adjunct)  Date:  07/11/2017  Time:  3:51 PM  Type of Therapy:  Psychoeducational Skills  Participation Level:  Did Not Attend    Summary of Progress/Problems:  Jeanne Hendricks 07/11/2017, 3:51 PM

## 2017-07-11 NOTE — Plan of Care (Signed)
Patient is up ad lib with steady gait. Alert and oriented to self and place. Affect and mood flat. Denies SI/HI/AVH. Observed in the bed resting majority of the morning. Denies having loose stools at this time. Frequent encouragement needed to get out of bed to eat her meals. Milieu remains safe with q 15 minute safety checks. Will continue to monitor.

## 2017-07-11 NOTE — Progress Notes (Signed)
[facility] MD Progress Note  07/11/2017 2:11 PM Danely Bayliss  MRN:  [id number]  Subjective:   Ms. Milo is "just chilling" in her room with curtains closed and lights off. She again is asking to be discharged right now. She does not look sedated, which was a worry yesterday. It is hard to know what her baseline is. She denies any problems. There are no unwanted behaviors.  Treatment plan. We continue Zyprexa, Depakote,   Social/disposition. She will be discharged to home with the mother. Follow up with [facility].  Principal Problem: Schizoaffective disorder, bipolar type (HCC) Diagnosis:   Patient Active Problem List   Diagnosis Date Noted  . Schizoaffective disorder, bipolar type (HCC) [F25.0] 07/04/2017    Priority: High  . OCD (obsessive compulsive disorder) [F42.9] 07/06/2017  . HTN (hypertension) [I10] 07/04/2017  . Diabetes (HCC) [E11.9] 07/04/2017  . Hypothyroidism [E03.9] 07/04/2017  . Suicidal ideation [R45.851] 07/04/2017   Total Time spent with patient: 20 minutes  Past Psychiatric History: bipolar disorder.  Past Medical History:  Past Medical History:  Diagnosis Date  . Depression   . Diabetes mellitus without complication (HCC)   . Hypertension   . Thyroid disease    History reviewed. No pertinent surgical history. Family History: History reviewed. No pertinent family history. Family Psychiatric  History: bipolar disorder. Social History:  Social History   Substance and Sexual Activity  Alcohol Use No  . Frequency: Never     Social History   Substance and Sexual Activity  Drug Use No    Social History   Socioeconomic History  . Marital status: Single    Spouse name: None  . Number of children: None  . Years of education: None  . Highest education level: None  Social Needs  . Financial resource strain: None  . Food insecurity - worry: None  . Food insecurity - inability: None  . Transportation needs - medical: None  . Transportation needs -  non-medical: None  Occupational History  . None  Tobacco Use  . Smoking status: Former Games developer  . Smokeless tobacco: Former Engineer, water and Sexual Activity  . Alcohol use: No    Frequency: Never  . Drug use: No  . Sexual activity: None  Other Topics Concern  . None  Social History Narrative  . None   Additional Social History:                         Sleep: Fair  Appetite:  Fair  Current Medications: Current Facility-Administered Medications  Medication Dose Route Frequency Provider Last Rate Last Dose  . acetaminophen (TYLENOL) tablet 650 mg  650 mg Oral Q6H PRN Pucilowska, Jolanta B, MD   650 mg at 07/10/17 2007  . alum & mag hydroxide-simeth (MAALOX/MYLANTA) 200-200-20 MG/5ML suspension 30 mL  30 mL Oral Q4H PRN Pucilowska, Jolanta B, MD      . atorvastatin (LIPITOR) tablet 10 mg  10 mg Oral q1800 Pucilowska, Jolanta B, MD   10 mg at 07/09/17 1711  . divalproex (DEPAKOTE) DR tablet 500 mg  500 mg Oral Q12H Pucilowska, Jolanta B, MD   500 mg at 07/11/17 0801  . fluvoxaMINE (LUVOX) tablet 100 mg  100 mg Oral QHS Pucilowska, Jolanta B, MD   100 mg at 07/10/17 2100  . levothyroxine (SYNTHROID, LEVOTHROID) tablet 100 mcg  100 mcg Oral QAC breakfast Pucilowska, Jolanta B, MD   100 mcg at 07/11/17 0801  . losartan (COZAAR) tablet 50  mg  50 mg Oral Daily Pucilowska, Jolanta B, MD   50 mg at 07/11/17 0801  . magnesium hydroxide (MILK OF MAGNESIA) suspension 30 mL  30 mL Oral Daily PRN Pucilowska, Jolanta B, MD   30 mL at 07/08/17 0903  . metFORMIN (GLUCOPHAGE) tablet 500 mg  500 mg Oral BID WC Pucilowska, Jolanta B, MD   500 mg at 07/11/17 0801  . OLANZapine (ZYPREXA) tablet 30 mg  30 mg Oral QHS Pucilowska, Jolanta B, MD   30 mg at 07/10/17 2059  . temazepam (RESTORIL) capsule 15 mg  15 mg Oral QHS Pucilowska, Jolanta B, MD   15 mg at 07/10/17 2100    Lab Results:  Results for orders placed or performed during the hospital encounter of 07/05/17 (from the past 48  hour(s))  Valproic acid level     Status: Abnormal   Collection Time: 07/10/17  7:08 AM  Result Value Ref Range   Valproic Acid Lvl 123 (H) 50.0 - 100.0 ug/mL    Comment: Performed at [facility], [address] [place], [place] [address]  Ammonia     Status: None   Collection Time: 07/10/17  7:08 AM  Result Value Ref Range   Ammonia 35 9 - 35 umol/L    Comment: Performed at [facility], [address] [place], [place] [address]    Blood Alcohol level:  Lab Results  Component Value Date   [facility] <10 07/03/2017    Metabolic Disorder Labs: Lab Results  Component Value Date   HGBA1C 5.1 07/06/2017   MPG 99.67 07/06/2017   No results found for: PROLACTIN Lab Results  Component Value Date   CHOL 103 07/06/2017   TRIG 112 07/06/2017   HDL 39 (L) 07/06/2017   CHOLHDL 2.6 07/06/2017   VLDL 22 07/06/2017   LDLCALC 42 07/06/2017    Physical Findings: AIMS: Facial and Oral Movements Muscles of Facial Expression: None, normal Lips and Perioral Area: None, normal Jaw: None, normal Tongue: None, normal,Extremity Movements Upper (arms, wrists, hands, fingers): None, normal Lower (legs, knees, ankles, toes): None, normal, Trunk Movements Neck, shoulders, hips: None, normal, Overall Severity Severity of abnormal movements (highest score from questions above): None, normal Incapacitation due to abnormal movements: None, normal Patient's awareness of abnormal movements (rate only patient's report): No Awareness, Dental Status Current problems with teeth and/or dentures?: No Does patient usually wear dentures?: No  CIWA:    COWS:     Musculoskeletal: Strength & Muscle Tone: within normal limits Gait & Station: normal Patient leans: N/A  Psychiatric Specialty Exam: Physical Exam  Nursing note and vitals reviewed. Psychiatric: Her speech is normal. Her mood appears anxious. Her affect is inappropriate. She is withdrawn. Thought content is paranoid.  Cognition and memory are normal. She expresses impulsivity.    Review of Systems  Gastrointestinal: Positive for diarrhea.  Neurological: Negative.   Psychiatric/Behavioral: Negative.   All other systems reviewed and are negative.   Blood pressure 122/83, pulse (!) 116, temperature 98.6 F (37 C), temperature source Oral, resp. rate 16, height 5\' 5"  (1.651 m), weight 66.2 kg (146 lb), SpO2 99 %.Body mass index is 24.3 kg/m.  General Appearance: Disheveled  Eye Contact:  Good  Speech:  Clear and Coherent  Volume:  Normal  Mood:  Anxious  Affect:  Inappropriate  Thought Process:  Disorganized and Descriptions of Associations: Tangential  Orientation:  Full (Time, Place, and Person)  Thought Content:  Delusions, Hallucinations: Auditory and Paranoid Ideation  Suicidal  Thoughts:  No  Homicidal Thoughts:  No  Memory:  Immediate;   Fair Recent;   Fair Remote;   Fair  Judgement:  Poor  Insight:  Lacking  Psychomotor Activity:  Psychomotor Retardation  Concentration:  Concentration: Fair and Attention Span: Fair  Recall:  [employer]Fair  Fund of Knowledge:  Fair  Language:  Fair  Akathisia:  No  Handed:  Right  AIMS (if indicated):     Assets:  Communication Skills Desire for Improvement Housing Physical Health Resilience Social Support  ADL's:  Intact  Cognition:  WNL  Sleep:  Number of Hours: 6.45     Treatment Plan Summary: Daily contact with patient to assess and evaluate symptoms and progress in treatment and Medication management   [name] is a 47 year old female with a history of bipolar disorder admitted for psychotic break.  #Suicidal ideation -patient able to contract for safety  #Mood and psychosis -continue Zyprexa 30 mg nightly -continue Depakote 500 mg BID -continue Restoril to 15 mg nightly -consider Invega sustenna injections  #OCD -continue Luvox 100 mg nightly  #Hypothyroidism -continue Synthroid 100 mcg  #HTN -Cozaar 50 mg  daily  #Dyslipidemia -Lipitor 10 mg daily  #Metabolic syndrome monitoring -Lipid panel, TSH, HgbA1C are unremarkable -EKG, pending will f/u  -pregnancy test is negative  #Disposition -discharge with the mother -[facility]     Kristine LineaJolanta Pucilowska, MD 07/11/2017, 2:11 PM

## 2017-07-11 NOTE — Progress Notes (Signed)
Patient ID: Jeanne Hendricks, female   DOB: 1971-05-04, 47 y.o.   MRN: [id number] Observed still sleeping until I knocked, introduced myself and offered Depakote as scheduled for 2000! Patient woke up confused, disheveled, "what's today's date? what time is it? Almost 8 in the morning or night time?..." Needy, childlike, intrusive, restless, hyperactive, incoherent, very frequent with multiple requests at the nurses' station; her dinner tray offered but she threw it away. "I need Ativan, can you give me Ativan .Marland Kitchen...." Medications as scheduled given at bedtime, redirected back to bed.

## 2017-07-11 NOTE — [facility] Group Notes (Signed)
07/11/2017 1PM  Type of Therapy/Topic:  Group Therapy:  Feelings about Diagnosis  Participation Level:  Did Not Attend   Description of Group:   This group will allow patients to explore their thoughts and feelings about diagnoses they have received. Patients will be guided to explore their level of understanding and acceptance of these diagnoses. Facilitator will encourage patients to process their thoughts and feelings about the reactions of others to their diagnosis and will guide patients in identifying ways to discuss their diagnosis with significant others in their lives. This group will be process-oriented, with patients participating in exploration of their own experiences, giving and receiving support, and processing challenge from other group members.   Therapeutic Goals: 1. Patient will demonstrate understanding of diagnosis as evidenced by identifying two or more symptoms of the disorder 2. Patient will be able to express two feelings regarding the diagnosis 3. Patient will demonstrate their ability to communicate their needs through discussion and/or role play  Summary of Patient Progress: Pt was invited to attend group but chose not to attend. CSW will continue to encourage pt to attend group throughout their admission.    Therapeutic Modalities:   Cognitive Behavioral Therapy Brief Therapy  Heidi DachKelsey Demarion Pondexter, MSW, LCSW 07/11/2017 1:58 PM

## 2017-07-11 NOTE — Plan of Care (Signed)
Patient slept for Estimated Hours of 6.45; Precautionary checks every 15 minutes for safety maintained, room free of safety hazards, patient sustains no injury or falls during this shift.  

## 2017-07-11 NOTE — [facility] Group Notes (Signed)
LCSW Group Therapy Note 07/11/2017 9:00am  Type of Therapy and Topic:  Group Therapy:  Setting Goals  Participation Level:  Did Not Attend  Description of Group: In this process group, patients discussed using strengths to work toward goals and address challenges.  Patients identified two positive things about themselves and one goal they were working on.  Patients were given the opportunity to share openly and support each other's plan for self-empowerment.  The group discussed the value of gratitude and were encouraged to have a daily reflection of positive characteristics or circumstances.  Patients were encouraged to identify a plan to utilize their strengths to work on current challenges and goals.  Therapeutic Goals 1. Patient will verbalize personal strengths/positive qualities and relate how these can assist with achieving desired personal goals 2. Patients will verbalize affirmation of peers plans for personal change and goal setting 3. Patients will explore the value of gratitude and positive focus as related to successful achievement of goals 4. Patients will verbalize a plan for regular reinforcement of personal positive qualities and circumstances.  Summary of Patient Progress:       Therapeutic Modalities Cognitive Behavioral Therapy Motivational Interviewing    Jlon Betker P Rheba Diamond, LCSW 07/11/2017 5:42 PM   

## 2017-07-12 NOTE — [facility] Group Notes (Signed)
07/12/2017 1PM  Type of Therapy/Topic:  Group Therapy:  Emotion Regulation  Participation Level:  Did Not Attend   Description of Group:   The purpose of this group is to assist patients in learning to regulate negative emotions and experience positive emotions. Patients will be guided to discuss ways in which they have been vulnerable to their negative emotions. These vulnerabilities will be juxtaposed with experiences of positive emotions or situations, and patients will be challenged to use positive emotions to combat negative ones. Special emphasis will be placed on coping with negative emotions in conflict situations, and patients will process healthy conflict resolution skills.  Therapeutic Goals: 1. Patient will identify two positive emotions or experiences to reflect on in order to balance out negative emotions 2. Patient will label two or more emotions that they find the most difficult to experience 3. Patient will demonstrate positive conflict resolution skills through discussion and/or role plays  Summary of Patient Progress: Patient was encouraged and invited to attend group. Patient did not attend group. Social worker will continue to encourage group participation in the future.        Therapeutic Modalities:   Cognitive Behavioral Therapy Feelings Identification Dialectical Behavioral Therapy   Johny ShearsCassandra  Allon Costlow, Alexander MtLCSW 07/12/2017 2:33 PM

## 2017-07-12 NOTE — Tx Team (Signed)
Interdisciplinary Treatment and Diagnostic Plan Update  07/12/2017 Time of Session: 10:55 AM Jeanne Hendricks MRN: [id number]  Principal Diagnosis: Schizoaffective disorder, bipolar type (HCC)  Secondary Diagnoses: Principal Problem:   Schizoaffective disorder, bipolar type (HCC) Active Problems:   HTN (hypertension)   Diabetes (HCC)   Hypothyroidism   Suicidal ideation   OCD (obsessive compulsive disorder)   Current Medications:  Current Facility-Administered Medications  Medication Dose Route Frequency Provider Last Rate Last Dose  . acetaminophen (TYLENOL) tablet 650 mg  650 mg Oral Q6H PRN Pucilowska, Jolanta B, MD   650 mg at 07/12/17 0832  . alum & mag hydroxide-simeth (MAALOX/MYLANTA) 200-200-20 MG/5ML suspension 30 mL  30 mL Oral Q4H PRN Pucilowska, Jolanta B, MD      . atorvastatin (LIPITOR) tablet 10 mg  10 mg Oral q1800 Pucilowska, Jolanta B, MD   10 mg at 07/11/17 1703  . divalproex (DEPAKOTE) DR tablet 500 mg  500 mg Oral Q12H Pucilowska, Jolanta B, MD   500 mg at 07/12/17 0832  . fluvoxaMINE (LUVOX) tablet 100 mg  100 mg Oral QHS Pucilowska, Jolanta B, MD   100 mg at 07/11/17 2028  . levothyroxine (SYNTHROID, LEVOTHROID) tablet 100 mcg  100 mcg Oral QAC breakfast Pucilowska, Jolanta B, MD   100 mcg at 07/12/17 0832  . losartan (COZAAR) tablet 50 mg  50 mg Oral Daily Pucilowska, Jolanta B, MD   50 mg at 07/12/17 0832  . magnesium hydroxide (MILK OF MAGNESIA) suspension 30 mL  30 mL Oral Daily PRN Pucilowska, Jolanta B, MD   30 mL at 07/08/17 0903  . metFORMIN (GLUCOPHAGE) tablet 500 mg  500 mg Oral BID WC Pucilowska, Jolanta B, MD   500 mg at 07/12/17 0832  . OLANZapine (ZYPREXA) tablet 30 mg  30 mg Oral QHS Pucilowska, Jolanta B, MD   30 mg at 07/11/17 2028  . temazepam (RESTORIL) capsule 15 mg  15 mg Oral QHS Pucilowska, Jolanta B, MD   15 mg at 07/11/17 2028   PTA Medications: Medications Prior to Admission  Medication Sig Dispense Refill Last Dose  . docusate sodium  (COLACE) 100 MG capsule Take 100 mg by mouth daily as needed for mild constipation.    at Unknown  . atorvastatin (LIPITOR) 10 MG tablet Take 10 mg by mouth at bedtime.   07/05/2017 at 1715  . diclofenac (VOLTAREN) 75 MG EC tablet Take 75 mg by mouth 2 (two) times daily.   07/05/2017 at 1715  . levothyroxine (SYNTHROID, LEVOTHROID) 50 MCG tablet Take 50 mcg by mouth daily before breakfast.    at Unknown  . losartan (COZAAR) 50 MG tablet Take 50 mg by mouth daily after breakfast.   07/05/2017 at 1715  . metFORMIN (GLUCOPHAGE) 500 MG tablet Take 500 mg by mouth daily before breakfast.   07/05/2017 at 1715  . mirtazapine (REMERON SOL-TAB) 30 MG disintegrating tablet Take 30 mg by mouth at bedtime.    at Unknown  . OLANZapine (ZYPREXA) 20 MG tablet Take 20 mg by mouth at bedtime.    at Unknown  . pantoprazole (PROTONIX) 40 MG tablet Take 40 mg by mouth daily.    at Unknown  . propranolol (INDERAL) 20 MG tablet Take 20 mg by mouth 2 (two) times daily.   07/05/2017 at 1715  . QUEtiapine (SEROQUEL) 25 MG tablet Take 25 mg by mouth 2 (two) times daily as needed (panic attacks).    at Unknown  . QUEtiapine (SEROQUEL) 400 MG tablet Take 400 mg  by mouth at bedtime.    at Unknown  . QUEtiapine (SEROQUEL) 50 MG tablet Take 50 mg by mouth 2 (two) times daily as needed (panic attacks).    at Unknown    Patient Stressors: Health problems  Patient Strengths: Ability for insight Motivation for treatment/growth  Treatment Modalities: Medication Management, Group therapy, Case management,  1 to 1 session with clinician, Psychoeducation, Recreational therapy.   Physician Treatment Plan for Primary Diagnosis: Schizoaffective disorder, bipolar type (HCC) Long Term Goal(s): Improvement in symptoms so as ready for discharge NA   Short Term Goals: Ability to identify changes in lifestyle to reduce recurrence of condition will improve Ability to verbalize feelings will improve Ability to disclose and discuss suicidal  ideas Ability to demonstrate self-control will improve Ability to identify and develop effective coping behaviors will improve Ability to maintain clinical measurements within normal limits will improve Ability to identify triggers associated with substance abuse/mental health issues will improve NA  Medication Management: Evaluate patient's response, side effects, and tolerance of medication regimen.  Therapeutic Interventions: 1 to 1 sessions, Unit Group sessions and Medication administration.  Evaluation of Outcomes: Progressing  Physician Treatment Plan for Secondary Diagnosis: Principal Problem:   Schizoaffective disorder, bipolar type (HCC) Active Problems:   HTN (hypertension)   Diabetes (HCC)   Hypothyroidism   Suicidal ideation   OCD (obsessive compulsive disorder)  Long Term Goal(s): Improvement in symptoms so as ready for discharge NA   Short Term Goals: Ability to identify changes in lifestyle to reduce recurrence of condition will improve Ability to verbalize feelings will improve Ability to disclose and discuss suicidal ideas Ability to demonstrate self-control will improve Ability to identify and develop effective coping behaviors will improve Ability to maintain clinical measurements within normal limits will improve Ability to identify triggers associated with substance abuse/mental health issues will improve NA     Medication Management: Evaluate patient's response, side effects, and tolerance of medication regimen.  Therapeutic Interventions: 1 to 1 sessions, Unit Group sessions and Medication administration.  Evaluation of Outcomes: Progressing   RN Treatment Plan for Primary Diagnosis: Schizoaffective disorder, bipolar type (HCC) Long Term Goal(s): Knowledge of disease and therapeutic regimen to maintain health will improve  Short Term Goals: Ability to demonstrate self-control, Ability to identify and develop effective coping behaviors will improve  and Compliance with prescribed medications will improve  Medication Management: RN will administer medications as ordered by provider, will assess and evaluate patient's response and provide education to patient for prescribed medication. RN will report any adverse and/or side effects to prescribing provider.  Therapeutic Interventions: 1 on 1 counseling sessions, Psychoeducation, Medication administration, Evaluate responses to treatment, Monitor vital signs and CBGs as ordered, Perform/monitor CIWA, COWS, AIMS and Fall Risk screenings as ordered, Perform wound care treatments as ordered.  Evaluation of Outcomes: Progressing   LCSW Treatment Plan for Primary Diagnosis: Schizoaffective disorder, bipolar type (HCC) Long Term Goal(s): Safe transition to appropriate next level of care at discharge, Engage patient in therapeutic group addressing interpersonal concerns.  Short Term Goals: Engage patient in aftercare planning with referrals and resources, Increase social support and Increase skills for wellness and recovery  Therapeutic Interventions: Assess for all discharge needs, 1 to 1 time with Social worker, Explore available resources and support systems, Assess for adequacy in community support network, Educate family and significant other(s) on suicide prevention, Complete Psychosocial Assessment, Interpersonal group therapy.  Evaluation of Outcomes: Progressing   Progress in Treatment: Attending groups: No. Participating in groups: No.  Taking medication as prescribed: Yes. Toleration medication: Yes. Family/Significant other contact made: Yes, individual(s) contacted:  CSW contacted pt's mother Jeanne Hendricks. Patient understands diagnosis: No. Discussing patient identified problems/goals with staff: Yes. Medical problems stabilized or resolved: Yes. Denies suicidal/homicidal ideation: Yes. Issues/concerns per patient self-inventory: No. Other: n/a  New problem(s) identified: Yes,  Describe:  Pt has stopped participating in groups and is very invasive. She comes to the Nurse's Station severe times a day although the nurse's have asked her to only come once a day if she needs anything.  New Short Term/Long Term Goal(s): Pt's stated goal for this hospitalization is "to get better"  Discharge Plan or Barriers: Discharge plan remains in place for pt to be discharged back to her home with her mother with follow-up tx [facility] in [place], [place].  Reason for Continuation of Hospitalization: Anxiety Depression Medication stabilization  Estimated Length of Stay: 3-5 days  Recreational Therapy: Patient Stressors: Anxiety, Depression Patient Goal: Patient will identify 3 positive coping skills to use post discharge x5 days.   Attendees: Patient:  07/12/2017 2:42 PM  Physician: Kristine LineaJolanta Pucilowska, MD 07/12/2017 2:42 PM  Nursing: Hulan AmatoGwen Farrish, RN 07/12/2017 2:42 PM  RN Care Manager: 07/12/2017 2:42 PM  Social Worker: Huey RomansSonya Sami Froh, LCSW 07/12/2017 2:42 PM  Recreational Therapist: Garret ReddishShay Outlaw, LRT 07/12/2017 2:42 PM  Other:  07/12/2017 2:42 PM  Other:  07/12/2017 2:42 PM  Other: 07/12/2017 2:42 PM    Scribe for Treatment Team: Alease FrameSonya S Kenzly Rogoff, LCSW 07/12/2017 2:42 PM

## 2017-07-12 NOTE — Consult Note (Signed)
ECT: Consult received.  I have reviewed some of the chart.  I spoke with the patient very briefly but she was having a visit from her family.  She invited me to speak with her mother which I did for a few minutes as well.  It sounds like this patient's current condition is not her baseline although she has had mental health problems and past.  I will follow up and do a more complete evaluation tomorrow.

## 2017-07-12 NOTE — Progress Notes (Signed)
Recreation Therapy Notes  Date: 02.06.2019  Time: 9:30 am  Location: Craft Room  Behavioral response: N/A  Intervention Topic: Creative expressions  Discussion/Intervention: Patient did not attend group. Clinical Observations/Feedback: Patient did not attend group.  Venita Seng LRT/CTRS         Timarion Agcaoili 07/12/2017 10:17 AM

## 2017-07-12 NOTE — [facility] Group Notes (Signed)
[facility] Group Notes:  (Nursing/MHT/Case Management/Adjunct)  Date:  07/12/2017  Time:  3:14 PM  Type of Therapy:  Psychoeducational Skills  Participation Level:  Did Not Attend   Summary of Progress/Problems:  Kerrie PleasureKemiya L Britiany Silbernagel 07/12/2017, 3:14 PM

## 2017-07-12 NOTE — [facility] Group Notes (Signed)
[facility] Group Notes:  (Nursing/MHT/Case Management/Adjunct)  Date:  07/12/2017  Time:  11:13 PM  Type of Therapy:  Group Therapy  Participation Level:  Active  Participation Quality:  Walking in and out of group.  Affect:  Appropriate  Cognitive:  Appropriate  Insight:  Good  Engagement in Group:  Engaged  Modes of Intervention:  Activity  Summary of Progress/Problems:  Mayra NeerJackie L Ellionna Buckbee 07/12/2017, 11:13 PM

## 2017-07-12 NOTE — Progress Notes (Signed)
Recreation Therapy Notes  Date: 02.06.2019  Time: 3:00pm  Location: Craft room  Behavioral response: N/A  Group Type: Craft  Participation level: N/A  Communication: Patient did not attend group.   Comments: N/A  Latrecia Capito LRT/CTRS        Elisabet Gutzmer 07/12/2017 4:01 PM

## 2017-07-12 NOTE — [facility] Group Notes (Signed)
[facility] Group Notes:  (Nursing/MHT/Case Management/Adjunct)  Date:  07/12/2017  Time:  6:46 AM  Type of Therapy:  Psychoeducational Skills  Participation Level:  Active  Participation Quality:  Appropriate  Affect:  Appropriate  Cognitive:  Appropriate  Insight:  Appropriate  Engagement in Group:  Engaged  Modes of Intervention:  Discussion, Socialization and Support  Summary of Progress/Problems:  Jeanne Hendricks 07/12/2017, 6:46 AM

## 2017-07-12 NOTE — Plan of Care (Signed)
  Spiritual Needs Ability to function at adequate level 07/12/2017 0950 - Not Progressing by Elige Radonobb, Shalimar Mcclain B, RN Note Patient has to be encouraged to maintain personal care chores.  Patient gets preoccupied with something and does not do anything except for present to nurses station frequently to ask for the same thing constantly.     Activity: Interest or engagement in leisure activities will improve 07/12/2017 0950 - Not Progressing by Elige Radonobb, Kelsye Loomer B, RN Note Intrusive and rude.  Short attention span.  Unable to attend and participate in groups,.     Education: Utilization of techniques to improve thought processes will improve 07/12/2017 0950 - Not Progressing by Elige Radonobb, Eddie Koc B, RN Note Patient gets preoccupied with certain things and fixates on them. Thought processes are disorganized.   Knowledge of the prescribed therapeutic regimen will improve 07/12/2017 0950 - Not Progressing by Elige Radonobb, Quinton Voth B, RN Note Requires re-education of medications    Coping: Ability to cope will improve 07/12/2017 0950 - Progressing by Elige Radonobb, Demesha Boorman B, RN Note Continually requests medications for anxiety.   Ability to verbalize feelings will improve 07/12/2017 0950 - Progressing by Elige Radonobb, Treshun Wold B, RN   Health Behavior/Discharge Planning: Ability to make decisions will improve 07/12/2017 0950 - Not Progressing by Elige Radonobb, Destynie Toomey B, RN Compliance with therapeutic regimen will improve 07/12/2017 0950 - Progressing by Elige Radonobb, Avaley Coop B, RN Note Medication compliant   Role Relationship: Ability to demonstrate positive changes in social behaviors and relationships will improve 07/12/2017 0950 - Not Progressing by Elige Radonobb, Aristotle Lieb B, RN Note Intrusive and manipulative.  Tries to split staff   Safety: Ability to disclose and discuss suicidal ideas will improve 07/12/2017 0950 - Progressing by Elige Radonobb, Shavonna Corella B, RN Note Denies SI   Pain Managment: General experience of comfort will improve 07/12/2017 0950 - Not  Applicable by Elige Radonobb, Darielle Hancher B, RN

## 2017-07-12 NOTE — Progress Notes (Signed)
Patient ID: Jeanne Hendricks, female   DOB: 10-22-1970, 47 y.o.   MRN: [id number] Patient is everywhere. She did take a shower; she is not a group appropriate candidate; she was in & out of the group 3 times. She continues to be very restless, hyperactive, needy and intrusive. "I can't keep still, I need more medications, I can't go to sleep ..." Finally went to bed after MN.

## 2017-07-12 NOTE — Progress Notes (Signed)
[facility]  07/12/2017 5:09 PM Jeanne Hendricks  MRN:  [id number]  Subjective:  Jeanne Hendricks is very disorganized in her thinking and repetitive in her behavio. She comes to the nursing station all the time. With me, she only qants to talk about discharge "now". She is child like. We do not know her baseline. Night shift nurse suggested ECT consult as he could see this patient deteriorating.   Spoke with the mother who will visit tonight. There is family member with schizophrenia who responded well to Lithium and Invega sustenna injectios.  Treatment plan. We continue depakote and Zyprexa. We will offer Invega. Contacted Dr. Toni Amendlapacs about ECT consult.  Social/disposition. She will be discharged with the mother. Follow [facility] with  [facility].  Principal Problem: Schizoaffective disorder, bipolar type (HCC) Diagnosis:   Patient Active Problem List   Diagnosis Date Noted  . Schizoaffective disorder, bipolar type (HCC) [F25.0] 07/04/2017    Priority: High  . OCD (obsessive compulsive disorder) [F42.9] 07/06/2017  . HTN (hypertension) [I10] 07/04/2017  . Diabetes (HCC) [E11.9] 07/04/2017  . Hypothyroidism [E03.9] 07/04/2017  . Suicidal ideation [R45.851] 07/04/2017   Total Time spent with patient: 30 minutes  Past Psychiatric History: bipolar disorder  Past Medical History:  Past Medical History:  Diagnosis Date  . Depression   . Diabetes mellitus without complication (HCC)   . Hypertension   . Thyroid disease    History reviewed. No pertinent surgical history. Family History: History reviewed. No pertinent family history. Family Psychiatric  History: schizophrenia Social History:  Social History   Substance and Sexual Activity  Alcohol Use No  . Frequency: Never     Social History   Substance and Sexual Activity  Drug Use No    Social History   Socioeconomic History  . Marital status: Single    Spouse name: None  . Number of children: None  . Years of education: None   . Highest education level: None  Social Needs  . Financial resource strain: None  . Food insecurity - worry: None  . Food insecurity - inability: None  . Transportation needs - medical: None  . Transportation needs - non-medical: None  Occupational History  . None  Tobacco Use  . Smoking status: Former Games developermoker  . Smokeless tobacco: Former Engineer, waterUser  Substance and Sexual Activity  . Alcohol use: No    Frequency: Never  . Drug use: No  . Sexual activity: None  Other Topics Concern  . None  Social History Narrative  . None   Additional Social History:                         Sleep: Fair  Appetite:  Fair  Current Medications: Current Facility-Administered Medications  Medication Dose Route Frequency Provider Last Rate Last Dose  . acetaminophen (TYLENOL) tablet 650 mg  650 mg Oral Q6H PRN Pucilowska, Jolanta B, MD   650 mg at 07/12/17 0832  . alum & mag hydroxide-simeth (MAALOX/MYLANTA) 200-200-20 MG/5ML suspension 30 mL  30 mL Oral Q4H PRN Pucilowska, Jolanta B, MD      . atorvastatin (LIPITOR) tablet 10 mg  10 mg Oral q1800 Pucilowska, Jolanta B, MD   10 mg at 07/11/17 1703  . divalproex (DEPAKOTE) DR tablet 500 mg  500 mg Oral Q12H Pucilowska, Jolanta B, MD   500 mg at 07/12/17 0832  . fluvoxaMINE (LUVOX) tablet 100 mg  100 mg Oral QHS Pucilowska, Jolanta B, MD  100 mg at 07/11/17 2028  . levothyroxine (SYNTHROID, LEVOTHROID) tablet 100 mcg  100 mcg Oral QAC breakfast Pucilowska, Jolanta B, MD   100 mcg at 07/12/17 0832  . losartan (COZAAR) tablet 50 mg  50 mg Oral Daily Pucilowska, Jolanta B, MD   50 mg at 07/12/17 0832  . magnesium hydroxide (MILK OF MAGNESIA) suspension 30 mL  30 mL Oral Daily PRN Pucilowska, Jolanta B, MD   30 mL at 07/08/17 0903  . metFORMIN (GLUCOPHAGE) tablet 500 mg  500 mg Oral BID WC Pucilowska, Jolanta B, MD   500 mg at 07/12/17 0832  . OLANZapine (ZYPREXA) tablet 30 mg  30 mg Oral QHS Pucilowska, Jolanta B, MD   30 mg at 07/11/17 2028  .  temazepam (RESTORIL) capsule 15 mg  15 mg Oral QHS Pucilowska, Jolanta B, MD   15 mg at 07/11/17 2028    Lab Results: No results found for this or any previous visit (from the past 48 hour(s)).  Blood Alcohol level:  Lab Results  Component Value Date   ETH <10 07/03/2017    Metabolic Disorder Labs: Lab Results  Component Value Date   HGBA1C 5.1 07/06/2017   MPG 99.67 07/06/2017   No results found for: PROLACTIN Lab Results  Component Value Date   CHOL 103 07/06/2017   TRIG 112 07/06/2017   HDL 39 (L) 07/06/2017   CHOLHDL 2.6 07/06/2017   VLDL 22 07/06/2017   LDLCALC 42 07/06/2017    Physical Findings: AIMS: Facial and Oral Movements Muscles of Facial Expression: None, normal Lips and Perioral Area: None, normal Jaw: None, normal Tongue: None, normal,Extremity Movements Upper (arms, wrists, hands, fingers): None, normal Lower (legs, knees, ankles, toes): None, normal, Trunk Movements Neck, shoulders, hips: None, normal, Overall Severity Severity of abnormal movements (highest score from questions above): None, normal Incapacitation due to abnormal movements: None, normal Patient's awareness of abnormal movements (rate only patient's report): No Awareness, Dental Status Current problems with teeth and/or dentures?: No Does patient usually wear dentures?: No  CIWA:    COWS:     Musculoskeletal: Strength & Muscle Tone: within normal limits Gait & Station: normal Patient leans: N/A  Psychiatric Specialty Exam: Physical Exam  Nursing note and vitals reviewed. Psychiatric: Her mood appears anxious. Her affect is blunt and inappropriate. Her speech is delayed. She is slowed and withdrawn. Thought content is delusional. Cognition and memory are impaired. She expresses impulsivity.    Review of Systems  Neurological: Negative.   Psychiatric/Behavioral: Positive for hallucinations.  All other systems reviewed and are negative.   Blood pressure 121/88, pulse (!) 117,  temperature 98.6 F (37 C), temperature source Oral, resp. rate 16, height 5\' 5"  (1.651 m), weight 66.2 kg (146 lb), SpO2 99 %.Body mass index is 24.3 kg/m.  General Appearance: Disheveled  Eye Contact:  Fair  Speech:  Clear and Coherent  Volume:  Normal  Mood:  Anxious  Affect:  Inappropriate  Thought Process:  Irrelevant and Descriptions of Associations: Tangential  Orientation:  Full (Time, Place, and Person)  Thought Content:  Delusions, Obsessions and Paranoid Ideation  Suicidal Thoughts:  No  Homicidal Thoughts:  No  Memory:  Immediate;   Poor Recent;   Poor Remote;   Poor  Judgement:  Poor  Insight:  Lacking  Psychomotor Activity:  Restlessness  Concentration:  Concentration: Fair and Attention Span: Fair  Recall:  [employer] of Knowledge:  Fair  Language:  Fair  Akathisia:  No  Handed:  Right  AIMS (if indicated):     Assets:  Communication Skills Desire for Improvement Housing Physical Health Resilience Social Support  ADL's:  Intact  Cognition:  WNL  Sleep:  Number of Hours: 7.15     Treatment Plan Summary: Daily contact with patient to assess and evaluate symptoms and progress in treatment and Medication management   Jeanne Hendricks is a 47 year old female with a history of bipolar disorder admitted for psychotic break.  #Suicidal ideation -patient able to contract for safety  #Mood and psychosis -continue Zyprexa 30 mg nightly -continueDepakote 500 mgBID -continueRestorilto 15mg  nightly -start Invega  -consider Invega sustenna injections -ECT consult  #OCD -continue Luvox 100 mg nightly  #Hypothyroidism -continue Synthroid 100 mcg  #HTN -Cozaar 50 mg daily  #Dyslipidemia -Lipitor 10 mg daily  #Metabolic syndrome monitoring -Lipid panel, TSH, HgbA1C are unremarkable -EKG, pending will f/u  -pregnancy test is negative  #Disposition -discharge with the mother -follow up with [facility]     Kristine Linea, MD 07/12/2017,  5:09 PM

## 2017-07-12 NOTE — Plan of Care (Signed)
Patient slept for Estimated Hours of 7.15; Precautionary checks every 15 minutes for safety maintained, room free of safety hazards, patient sustains no injury or falls during this shift.  

## 2017-07-13 MED ORDER — CHLORDIAZEPOXIDE HCL 5 MG PO CAPS
5.0000 mg | ORAL_CAPSULE | Freq: Three times a day (TID) | ORAL | Status: AC
  Administered 2017-07-13 – 2017-07-16 (×9): 5 mg via ORAL
  Filled 2017-07-13 (×9): qty 1

## 2017-07-13 MED ORDER — FLUVOXAMINE MALEATE 50 MG PO TABS
200.0000 mg | ORAL_TABLET | Freq: Every day | ORAL | Status: DC
  Administered 2017-07-13 – 2017-07-17 (×5): 200 mg via ORAL
  Filled 2017-07-13 (×5): qty 4

## 2017-07-13 MED ORDER — PALIPERIDONE ER 3 MG PO TB24
6.0000 mg | ORAL_TABLET | Freq: Every day | ORAL | Status: DC
  Administered 2017-07-13: 6 mg via ORAL
  Filled 2017-07-13: qty 2

## 2017-07-13 NOTE — Plan of Care (Signed)
Patient slept for Estimated Hours of 7; Precautionary checks every 15 minutes for safety maintained, room free of safety hazards, patient sustains no injury or falls during this shift.  

## 2017-07-13 NOTE — Progress Notes (Signed)
[facility] MD Progress Note  07/13/2017 11:11 AM Jeanne Hendricks  MRN:  [id number]  Subjective:   Ms. Jeanne Hendricks again is sitting in her dark room. She is not asleep. She denies any problems and wants to be discharged "now". At times she is hyper and rather intrussive. Very childlike. 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 Patient met witrh Dr. Weber Cooks about possible ECT treatment. She is not opposed but is afraid it would hurt.   Spoke with the mother extensively. There is a niece in the family who underwent ECT for suicidality and had headaches and memory loss.   Social/disposition. Patient will return home with family. Follow up with [facility]. A letter to [facility] office was provided.  Principal Problem: Schizoaffective disorder, bipolar type ([place]) Diagnosis:   Patient Active Problem List   Diagnosis Date Noted  . Schizoaffective disorder, bipolar type ([place]) [F25.0] 07/04/2017    Priority: High  . OCD (obsessive compulsive disorder) [F42.9] 07/06/2017  . HTN (hypertension) [I10] 07/04/2017  . Diabetes ([place]) [E11.9] 07/04/2017  . Hypothyroidism [E03.9] 07/04/2017  . Suicidal ideation [R45.851] 07/04/2017   Total Time spent with patient: 30 minutes  Past Psychiatric History: bipolar disorder  Past Medical History:  Past Medical History:  Diagnosis Date  . Depression   . Diabetes mellitus without complication ([place])   . Hypertension   . Thyroid disease    History reviewed. No pertinent surgical history. Family History: History reviewed. No pertinent family history. Family Psychiatric  History: schizophrenia, bipolar, ADHD, depression. Social History:  Social History   Substance and Sexual Activity  Alcohol Use No  . Frequency: Never     Social History   Substance and Sexual Activity  Drug Use No    Social History   Socioeconomic History  . Marital status: Single    Spouse name:  None  . Number of children: None  . Years of education: None  . Highest education level: None  Social Needs  . Financial resource strain: None  . Food insecurity - worry: None  . Food insecurity - inability: None  . Transportation needs - medical: None  . Transportation needs - non-medical: None  Occupational History  . None  Tobacco Use  . Smoking status: Former Research scientist (life sciences)  . Smokeless tobacco: Former Network engineer and Sexual Activity  . Alcohol use: No    Frequency: Never  . Drug use: No  . Sexual activity: None  Other Topics Concern  . None  Social History Narrative  . None   Additional Social History:                         Sleep: Fair  Appetite:  Fair  Current Medications: Current Facility-Administered Medications  Medication Dose Route Frequency Provider Last Rate Last Dose  . acetaminophen (TYLENOL) tablet 650 mg  650 mg Oral Q6H PRN ,  B, MD   650 mg at 07/12/17 0832  . alum & mag hydroxide-simeth (MAALOX/MYLANTA) 200-200-20 MG/5ML suspension 30 mL  30 mL Oral Q4H PRN ,  B, MD      . atorvastatin (LIPITOR) tablet 10 mg  10 mg Oral q1800 ,  B, MD   10 mg at 07/12/17 1727  . divalproex (DEPAKOTE) DR tablet 500 mg  500 mg Oral Q12H ,  B, MD   500 mg at 07/13/17 0946  . fluvoxaMINE (  LUVOX) tablet 100 mg  100 mg Oral QHS ,  B, MD   100 mg at 07/12/17 2200  . levothyroxine (SYNTHROID, LEVOTHROID) tablet 100 mcg  100 mcg Oral QAC breakfast ,  B, MD   100 mcg at 07/13/17 0946  . losartan (COZAAR) tablet 50 mg  50 mg Oral Daily ,  B, MD   50 mg at 07/13/17 0946  . magnesium hydroxide (MILK OF MAGNESIA) suspension 30 mL  30 mL Oral Daily PRN ,  B, MD   30 mL at 07/08/17 0903  . metFORMIN (GLUCOPHAGE) tablet 500 mg  500 mg Oral BID WC ,  B, MD   500 mg at 07/13/17 0946  . OLANZapine (ZYPREXA) tablet 30 mg  30 mg Oral  QHS ,  B, MD   30 mg at 07/12/17 2201  . paliperidone (INVEGA) 24 hr tablet 6 mg  6 mg Oral Daily ,  B, MD   6 mg at 07/13/17 0946  . temazepam (RESTORIL) capsule 15 mg  15 mg Oral QHS ,  B, MD   15 mg at 07/12/17 2200    Lab Results: No results found for this or any previous visit (from the past 48 hour(s)).  Blood Alcohol level:  Lab Results  Component Value Date   ETH <10 45/80/9983    Metabolic Disorder Labs: Lab Results  Component Value Date   HGBA1C 5.1 07/06/2017   MPG 99.67 07/06/2017   No results found for: PROLACTIN Lab Results  Component Value Date   CHOL 103 07/06/2017   TRIG 112 07/06/2017   HDL 39 (L) 07/06/2017   CHOLHDL 2.6 07/06/2017   VLDL 22 07/06/2017   LDLCALC 42 07/06/2017    Physical Findings: AIMS: Facial and Oral Movements Muscles of Facial Expression: None, normal Lips and Perioral Area: None, normal Jaw: None, normal Tongue: None, normal,Extremity Movements Upper (arms, wrists, hands, fingers): None, normal Lower (legs, knees, ankles, toes): None, normal, Trunk Movements Neck, shoulders, hips: None, normal, Overall Severity Severity of abnormal movements (highest score from questions above): None, normal Incapacitation due to abnormal movements: None, normal Patient's awareness of abnormal movements (rate only patient's report): No Awareness, Dental Status Current problems with teeth and/or dentures?: No Does patient usually wear dentures?: No  CIWA:    COWS:     Musculoskeletal: Strength & Muscle Tone: within normal limits Gait & Station: normal Patient leans: N/A  Psychiatric Specialty Exam: Physical Exam  Nursing note and vitals reviewed. Psychiatric: Her mood appears anxious. Her speech is delayed. She is slowed and withdrawn. Thought content is paranoid and delusional. Cognition and memory are impaired. She expresses impulsivity.    Review of Systems  Neurological: Negative.    Psychiatric/Behavioral: Positive for depression. The patient is nervous/anxious.   All other systems reviewed and are negative.   Blood pressure 119/88, pulse (!) 126, temperature (!) 97.5 F (36.4 C), temperature source Oral, resp. rate 16, height 5' 5" (1.651 m), weight 66.2 kg (146 lb), SpO2 98 %.Body mass index is 24.3 kg/m.  General Appearance: Disheveled  Eye Contact:  Good  Speech:  Clear and Coherent  Volume:  Normal  Mood:  Anxious  Affect:  Congruent  Thought Process:  Goal Directed  Orientation:  Full (Time, Place, and Person)  Thought Content:  Delusions, Hallucinations: Auditory and Rumination  Suicidal Thoughts:  No  Homicidal Thoughts:  No  Memory:  Immediate;   Poor Recent;   Poor Remote;   Poor  Judgement:  Poor  Insight:  Lacking  Psychomotor Activity:  Increased  Concentration:  Concentration: Poor and Attention Span: Poor  Recall:  Poor  Fund of Knowledge:  Poor  Language:  Poor  Akathisia:  No  Handed:  Right  AIMS (if indicated):     Assets:  Communication Skills Desire for Improvement Housing Physical Health Resilience Social Support  ADL's:  Intact  Cognition:  WNL  Sleep:  Number of Hours: 7     Treatment Plan Summary: Daily contact with patient to assess and evaluate symptoms and progress in treatment and Medication management   Ms. Eads is a 47 year old female with a history of bipolar disorder admitted for psychotic break.  #Suicidal ideation -patient able to contract for safety  #Mood and psychosis -continue Zyprexa 30 mg nightly -continueDepakote 500 mgBID -continueRestorilto 92m nightly -start Invega  -consider Invega sustenna injections -ECT consult  #OCD -continue Luvox 100 mg nightly  #Hypothyroidism -continueSynthroid 100 mcg  #HTN -Cozaar 50 mg daily  #Dyslipidemia -Lipitor 10 mg daily  #Metabolic syndrome monitoring -Lipid panel, TSH, HgbA1C are unremarkable -EKG, QTc 470 -pregnancy test is  negative  #Disposition -discharge with the mother -[facility]    JOrson Slick MD 07/13/2017, 11:11 AM

## 2017-07-13 NOTE — Progress Notes (Signed)
Recreation Therapy Notes   Date: 02.07.2019  Time: 9:30 am  Location: Craft Room  Behavioral response: N/A  Intervention Topic: Problem Solving  Discussion/Intervention: Patient did not attend group.   Clinical Observations/Feedback: Patient did not attend group.  Anagabriela Jokerst LRT/CTRS          Kailan Laws 07/13/2017 11:09 AM 

## 2017-07-13 NOTE — BHH Group Notes (Signed)
LCSW Group Therapy Note 07/13/2017 9:00 AM  Type of Therapy and Topic:  Group Therapy:  Setting Goals  Participation Level:  Did Not Attend  Description of Group: In this process group, patients discussed using strengths to work toward goals and address challenges.  Patients identified two positive things about themselves and one goal they were working on.  Patients were given the opportunity to share openly and support each other's plan for self-empowerment.  The group discussed the value of gratitude and were encouraged to have a daily reflection of positive characteristics or circumstances.  Patients were encouraged to identify a plan to utilize their strengths to work on current challenges and goals.  Therapeutic Goals 1. Patient will verbalize personal strengths/positive qualities and relate how these can assist with achieving desired personal goals 2. Patients will verbalize affirmation of peers plans for personal change and goal setting 3. Patients will explore the value of gratitude and positive focus as related to successful achievement of goals 4. Patients will verbalize a plan for regular reinforcement of personal positive qualities and circumstances.  Summary of Patient Progress:       Therapeutic Modalities Cognitive Behavioral Therapy Motivational Interviewing    Jeanne FrameSonya S Avedis Bevis, LCSW 07/13/2017 10:18 AM

## 2017-07-13 NOTE — Progress Notes (Signed)
Patient ID: Jeanne Hendricks, female   DOB: 01/15/1971, 47 y.o.   MRN: [id number] Patient remains hyperactive, restless, intrusive, anxious and aware of her condition; mother and sister visited with patient; finally in bed sleeping after MN.

## 2017-07-13 NOTE — BHH Group Notes (Signed)
07/13/2017  Time: 1:00PM  Type of Therapy/Topic:  Group Therapy:  Balance in Life  Participation Level:  Did Not Attend  Description of Group:   This group will address the concept of balance and how it feels and looks when one is unbalanced. Patients will be encouraged to process areas in their lives that are out of balance and identify reasons for remaining unbalanced. Facilitators will guide patients in utilizing problem-solving interventions to address and correct the stressor making their life unbalanced. Understanding and applying boundaries will be explored and addressed for obtaining and maintaining a balanced life. Patients will be encouraged to explore ways to assertively make their unbalanced needs known to significant others in their lives, using other group members and facilitator for support and feedback.  Therapeutic Goals: 1. Patient will identify two or more emotions or situations they have that consume much of in their lives. 2. Patient will identify signs/triggers that life has become out of balance:  3. Patient will identify two ways to set boundaries in order to achieve balance in their lives:  4. Patient will demonstrate ability to communicate their needs through discussion and/or role plays  Summary of Patient Progress: Pt was invited to attend group but chose not to attend. CSW will continue to encourage pt to attend group throughout their admission.    Therapeutic Modalities:   Cognitive Behavioral Therapy Solution-Focused Therapy Assertiveness Training   Heidi DachKelsey Manuelita Moxon, MSW, LCSW 07/13/2017 2:22 PM

## 2017-07-13 NOTE — Consult Note (Signed)
ECT: Patient seen.  I have read through the chart more as well.  47 year old woman with a definite but unclear past psychiatric history.  Currently with what she describes as "anxiety and depression".  Patient appears to be very nervous and have difficulty focusing.  I attempted to have a conversation with her sitting down in an office and the patient was unable to stay seated for more than a few seconds.  It is possible that this is just agitation but I wonder also if she could be suffering from akathisia given how she wanted to only talk to me if I could walk around and pace with her on the unit.  I attempted to start an explanation of what ECT was and what we used it for.  I got only about a minute into it and the patient said that she did not want to have it and refused to talk anymore.  She did not make any delusional statements.  She did not voice suicidal ideation however she seems to be clearly impaired in her function.  We would not be able to treat her tomorrow in any case.  I will try to continue to follow up.

## 2017-07-14 ENCOUNTER — Other Ambulatory Visit: Payer: Self-pay

## 2017-07-14 LAB — URINALYSIS, COMPLETE (UACMP) WITH MICROSCOPIC
Bilirubin Urine: NEGATIVE
Glucose, UA: NEGATIVE mg/dL
Hgb urine dipstick: NEGATIVE
KETONES UR: 5 mg/dL — AB
Leukocytes, UA: NEGATIVE
Nitrite: NEGATIVE
PH: 6 (ref 5.0–8.0)
Protein, ur: NEGATIVE mg/dL
Specific Gravity, Urine: 1.009 (ref 1.005–1.030)

## 2017-07-14 MED ORDER — FOSFOMYCIN TROMETHAMINE 3 G PO PACK
3.0000 g | PACK | Freq: Once | ORAL | Status: AC
  Administered 2017-07-14: 3 g via ORAL
  Filled 2017-07-14: qty 3

## 2017-07-14 MED ORDER — PHENAZOPYRIDINE HCL 100 MG PO TABS
100.0000 mg | ORAL_TABLET | Freq: Three times a day (TID) | ORAL | Status: DC
  Administered 2017-07-14 – 2017-07-17 (×10): 100 mg via ORAL
  Filled 2017-07-14 (×11): qty 1

## 2017-07-14 MED ORDER — PROPRANOLOL HCL 20 MG PO TABS
20.0000 mg | ORAL_TABLET | Freq: Three times a day (TID) | ORAL | Status: DC
  Administered 2017-07-14 – 2017-07-18 (×14): 20 mg via ORAL
  Filled 2017-07-14 (×15): qty 1

## 2017-07-14 NOTE — Progress Notes (Signed)
Patient urine was collected earlier this morning for UA and culture. Output measured at the time of collection was 550 mL. Specimen sent to the lab for assessment of urine. Will continue to monitor.

## 2017-07-14 NOTE — Plan of Care (Signed)
Patient alert and oriented to self and place. Affect flat, patient exhibiting some anxiety this morning. Approached this Clinical research associatewriter several times stating, "I can't pee". MD made aware and orders were put in to help resolve patient's discomfort. Patient encourage to drink fluids, patient verbalized understanding this. Patient does not attend groups or engages in any communication with peers on the unit. Milieu remains safe with q 15 minute safety checks.

## 2017-07-14 NOTE — BHH Group Notes (Signed)
07/14/2017 1PM  Type of Therapy and Topic:  Group Therapy:  Feelings around Relapse and Recovery  Participation Level:  Did Not Attend   Description of Group:    Patients in this group will discuss emotions they experience before and after a relapse. They will process how experiencing these feelings, or avoidance of experiencing them, relates to having a relapse. Facilitator will guide patients to explore emotions they have related to recovery. Patients will be encouraged to process which emotions are more powerful. They will be guided to discuss the emotional reaction significant others in their lives may have to patients' relapse or recovery. Patients will be assisted in exploring ways to respond to the emotions of others without this contributing to a relapse.  Therapeutic Goals: 1. Patient will identify two or more emotions that lead to a relapse for them 2. Patient will identify two emotions that result when they relapse 3. Patient will identify two emotions related to recovery 4. Patient will demonstrate ability to communicate their needs through discussion and/or role plays   Summary of Patient Progress: Patient was encouraged and invited to attend group. Patient did not attend group. Social worker will continue to encourage group participation in the future.      Therapeutic Modalities:   Cognitive Behavioral Therapy Solution-Focused Therapy Assertiveness Training Relapse Prevention Therapy   Johny ShearsCassandra  Cortney Beissel, LCSW 07/14/2017 1:54 PM

## 2017-07-14 NOTE — Progress Notes (Signed)
Patient ID: Jeanne Hendricks, female   DOB: [birth year], 47 y.o.   MRN: [id number] Constantly asking for one thing or the other, then she said, "I can't pee, I have been trying to pee since this morning; patient was given a hat to place on toilet in order to determine output; Dr. Demetrius CharityP gave RBO/VO to do bladder scan and determine how much urine she may be retaining; bladder scan indicated 476; feedback provided to Dr. Demetrius CharityP. No new orders. She, later, voided 350 ml and "I think I pee in the toilet also.Marland Kitchen."

## 2017-07-14 NOTE — Progress Notes (Addendum)
[facility] MD Progress Note  07/14/2017 10:14 AM Jeanne Hendricks  MRN:  [id number]  Subjective:   Jeanne Hendricks continues to be very intrusive, anxious and restless possibly with akathisia. All evening long she complained of urinary retention but her bladder scan showed only 400 ml of urine in the bladder. She later urinated with no problems. Today, again, she complains of urinary retention. We ordered UA, UCx, bladder scan.   Treatment plan. I discontinue Zyprexa and Invega. Continue Depakote and Luvox. Give a dose of Fosfomycin. We started Low dose Librium for anxiety and Propranolol for akathisia.   Social/disposition. She will be discharged with family. Follow up with [facility].  Principal Problem: Schizoaffective disorder, bipolar type ([facility]) Diagnosis:   Patient Active Problem List   Diagnosis Date Noted  . Schizoaffective disorder, bipolar type ([facility]) [F25.0] 07/04/2017    Priority: High  . OCD (obsessive compulsive disorder) [F42.9] 07/06/2017  . HTN (hypertension) [I10] 07/04/2017  . Diabetes ([facility]) [E11.9] 07/04/2017  . Hypothyroidism [E03.9] 07/04/2017  . Suicidal ideation [R45.851] 07/04/2017   Total Time spent with patient: 20 minutes  Past Psychiatric History: bipolar disorder  Past Medical History:  Past Medical History:  Diagnosis Date  . Depression   . Diabetes mellitus without complication ([facility])   . Hypertension   . Thyroid disease    History reviewed. No pertinent surgical history. Family History: History reviewed. No pertinent family history. Family Psychiatric  History: bipolar, schizophrenia, ADHD Social History:  Social History   Substance and Sexual Activity  Alcohol Use No  . Frequency: Never     Social History   Substance and Sexual Activity  Drug Use No    Social History   Socioeconomic History  . Marital status: Single    Spouse name: None  . Number of children: None  . Years of education: None  . Highest education level: None  Social Needs  .  Financial resource strain: None  . Food insecurity - worry: None  . Food insecurity - inability: None  . Transportation needs - medical: None  . Transportation needs - non-medical: None  Occupational History  . None  Tobacco Use  . Smoking status: Former Games developer  . Smokeless tobacco: Former Engineer, water and Sexual Activity  . Alcohol use: No    Frequency: Never  . Drug use: No  . Sexual activity: None  Other Topics Concern  . None  Social History Narrative  . None   Additional Social History:                         Sleep: Fair  Appetite:  Fair  Current Medications: Current Facility-Administered Medications  Medication Dose Route Frequency Provider Last Rate Last Dose  . acetaminophen (TYLENOL) tablet 650 mg  650 mg Oral Q6H PRN Evelyna Folker B, MD   650 mg at 07/12/17 0832  . alum & mag hydroxide-simeth (MAALOX/MYLANTA) 200-200-20 MG/5ML suspension 30 mL  30 mL Oral Q4H PRN Krishana Lutze B, MD      . atorvastatin (LIPITOR) tablet 10 mg  10 mg Oral q1800 Madi Bonfiglio B, MD   10 mg at 07/13/17 1708  . chlordiazePOXIDE (LIBRIUM) capsule 5 mg  5 mg Oral TID Caroll Weinheimer B, MD   5 mg at 07/14/17 0833  . divalproex (DEPAKOTE) DR tablet 500 mg  500 mg Oral Q12H Ezelle Surprenant B, MD   500 mg at 07/14/17 0833  . fluvoxaMINE (LUVOX) tablet 200 mg  200  mg Oral QHS Meloni Hinz B, MD   200 mg at 07/13/17 2200  . fosfomycin (MONUROL) packet 3 g  3 g Oral Once Annya Lizana B, MD      . levothyroxine (SYNTHROID, LEVOTHROID) tablet 100 mcg  100 mcg Oral QAC breakfast Mikal Blasdell B, MD   100 mcg at 07/14/17 0833  . losartan (COZAAR) tablet 50 mg  50 mg Oral Daily Hyla Coard B, MD   50 mg at 07/14/17 0833  . magnesium hydroxide (MILK OF MAGNESIA) suspension 30 mL  30 mL Oral Daily PRN Jerriah Ines B, MD   30 mL at 07/08/17 0903  . metFORMIN (GLUCOPHAGE) tablet 500 mg  500 mg Oral BID WC Maja Mccaffery B, MD    500 mg at 07/14/17 0832  . propranolol (INDERAL) tablet 20 mg  20 mg Oral TID Elizebeth Kluesner B, MD   20 mg at 07/14/17 0832  . temazepam (RESTORIL) capsule 15 mg  15 mg Oral QHS Seydina Holliman B, MD   15 mg at 07/13/17 2153    Lab Results: No results found for this or any previous visit (from the past 48 hour(s)).  Blood Alcohol level:  Lab Results  Component Value Date   ETH <10 07/03/2017    Metabolic Disorder Labs: Lab Results  Component Value Date   HGBA1C 5.1 07/06/2017   MPG 99.67 07/06/2017   No results found for: PROLACTIN Lab Results  Component Value Date   CHOL 103 07/06/2017   TRIG 112 07/06/2017   HDL 39 (L) 07/06/2017   CHOLHDL 2.6 07/06/2017   VLDL 22 07/06/2017   LDLCALC 42 07/06/2017    Physical Findings: AIMS: Facial and Oral Movements Muscles of Facial Expression: None, normal Lips and Perioral Area: None, normal Jaw: None, normal Tongue: None, normal,Extremity Movements Upper (arms, wrists, hands, fingers): None, normal Lower (legs, knees, ankles, toes): None, normal, Trunk Movements Neck, shoulders, hips: None, normal, Overall Severity Severity of abnormal movements (highest score from questions above): None, normal Incapacitation due to abnormal movements: None, normal Patient's awareness of abnormal movements (rate only patient's report): No Awareness, Dental Status Current problems with teeth and/or dentures?: No Does patient usually wear dentures?: No  CIWA:    COWS:     Musculoskeletal: Strength & Muscle Tone: within normal limits  Gait & Station: normal Patient leans: N/A  Psychiatric Specialty Exam: Physical Exam  Nursing note and vitals reviewed. Psychiatric: Her speech is normal. Her affect is inappropriate. She is hyperactive. Thought content is paranoid and delusional. Cognition and memory are impaired. She expresses impulsivity.    Review of Systems  Genitourinary: Positive for dysuria.  Neurological: Negative.    Psychiatric/Behavioral: The patient is nervous/anxious.   All other systems reviewed and are negative.   Blood pressure 137/85, pulse (!) 122, temperature 97.7 F (36.5 C), temperature source Oral, resp. rate 18, height 5\' 5"  (1.651 m), weight 66.2 kg (146 lb), SpO2 100 %.Body mass index is 24.3 kg/m.  General Appearance: Fairly Groomed  Eye Contact:  Good  Speech:  Pressured  Volume:  Increased  Mood:  Anxious  Affect:  Congruent  Thought Process:  Goal Directed and Descriptions of Associations: Tangential  Orientation:  Full (Time, Place, and Person)  Thought Content:  Delusions and Paranoid Ideation  Suicidal Thoughts:  No  Homicidal Thoughts:  No  Memory:  Immediate;   Fair Recent;   Fair Remote;   Fair  Judgement:  Poor  Insight:  Lacking  Psychomotor Activity:  Restlessness  Concentration:  Concentration: Poor and Attention Span: Poor  Recall:  Poor  Fund of Knowledge:  Fair  Language:  Fair  Akathisia:  Yes  Handed:  Right  AIMS (if indicated):     Assets:  Communication Skills Desire for Improvement Housing Physical Health Resilience Social Support  ADL's:  Intact  Cognition:  WNL  Sleep:  Number of Hours: 5     Treatment Plan Summary: Daily contact with patient to assess and evaluate symptoms and progress in treatment and Medication management   Ms. Borgwardt is a 47 year old female with a history of bipolar disorder admitted for psychotic break.  #Suicidal ideation, resolved -patient able to contract for safety  #Mood and psychosis -discontinue Zyprexa and Invega -start Propranolol 20 mg TID for akathysia -continueDepakote 500 mgBID -continueRestorilto 15mg  nightly -ECT consult  #OCD -continue Luvox 200 mg nightly -Librium 5 mg QID for 3 days  #UTI -UA and UCx -fosfomycin 3 g once -pyridium TID -bladder scan   #Hypothyroidism -continueSynthroid 100 mcg  #HTN -Cozaar 50 mg daily  #Dyslipidemia -Lipitor 10 mg  daily  #Metabolic syndrome monitoring -Lipid panel, TSH, HgbA1C are unremarkable -EKG, QTc 470 -pregnancy test is negative  #Disposition -discharge with the mother -follow up with [facility]      Kristine Linea, MD 07/14/2017, 10:14 AM

## 2017-07-14 NOTE — Plan of Care (Signed)
Patient slept for Estimated Hours of 5; Precautionary checks every 15 minutes for safety maintained, room free of safety hazards, patient sustains no injury or falls during this shift.  

## 2017-07-15 ENCOUNTER — Inpatient Hospital Stay: Payer: Medicaid Other

## 2017-07-15 LAB — URINE CULTURE: CULTURE: NO GROWTH

## 2017-07-15 NOTE — Progress Notes (Signed)
D:Pt denies SI/HI/AVH. Pt. Verbally contracts for safety. Pt. Verbalizes she agrees she can remain safe while on the unit. Pt. Continues to not attend to hygiene and activities of daily living. Pt. Sleeping, "good". Pt. Has been eating poorly and has reduced appetite it appears. Pt. Reports feeling, "good" today when asked if she feels better then yesterday. Pt. Upon presentation is frequently up at the nurses station asking multiple questions in a tangential manor and requires frequent redirection. Pt. Is attention-seeking, childlike, and intrusive. Pt. Will find random topics to obsess about and persist for several hours. Pt. Seems anxious at times, but reports anxiety low, "4/10".     A: Q x 15 minute observation checks were completed for safety. Patient was provided with education. Pt. Needs reinforcement on education provided.  Patient was given scheduled medications. Patient  was encourage to attend groups, participate in unit activities and continue with plan of care. Pt. Compliant with EKG this evening.  R:Patient is complaint with medication and unit procedures. Pt. Does not attend groups.            Patient slept for Estimated Hours of 6.45; Precautionary checks every 15 minutes for safety maintained, room free of safety hazards, patient sustains no injury or falls during this shift.

## 2017-07-15 NOTE — Plan of Care (Signed)
Pt. Sleeping, "good". Pt. Nutritional intake reports show poor appetite however. Pt. Reports feeling, "good" today when asked if she feels better then yesterday. Pt. Compliant with medications this evening. Pt. Denies SI/HI. Pt. Verbally contracts for safety. Pt. Verbalizes she agrees she can remain safe while on the unit. Pt. Continues to not attend to hygiene and activities of daily living. Pt. Does not go to groups. Pt. Needs reinforcement on education provided.    Not Progressing Spiritual Needs Ability to function at adequate level 07/15/2017 0116 - Not Progressing by Lenox PondsStevens, Takyah Ciaramitaro J, RN Activity: Interest or engagement in leisure activities will improve 07/15/2017 0116 - Not Progressing by Lenox PondsStevens, Rissie Sculley J, RN Education: Knowledge of the prescribed therapeutic regimen will improve 07/15/2017 0116 - Not Progressing by Lenox PondsStevens, Claudell Rhody J, RN [facility] Participation in Recreation Therapeutic Interventions STG-Other Recreation Therapy Goal (Specify) Description Patient will identify 3 positive coping skills to use post discharge x5 days.  07/15/2017 0116 - Not Progressing by Lenox PondsStevens, Keneisha Heckart J, RN   Progressing Activity: Imbalance in normal sleep/wake cycle will improve 07/15/2017 0116 - Progressing by Lenox PondsStevens, Zykee Avakian J, RN Coping: Ability to verbalize feelings will improve 07/15/2017 0116 - Progressing by Lenox PondsStevens, Kima Malenfant J, RN Health Behavior/Discharge Planning: Compliance with therapeutic regimen will improve 07/15/2017 0116 - Progressing by Lenox PondsStevens, Erikka Follmer J, RN Safety: Ability to disclose and discuss suicidal ideas will improve 07/15/2017 0116 - Progressing by Lenox PondsStevens, Heliodoro Domagalski J, RN Activity: Sleeping patterns will improve 07/15/2017 0116 - Progressing by Lenox PondsStevens, Jalani Rominger J, RN

## 2017-07-15 NOTE — [facility] Group Notes (Signed)
LCSW Group Therapy Note   07/15/2017 1:15pm   Type of Therapy and Topic:  Group Therapy:  Trust and Honesty  Participation Level:  Did Not Attend  Description of Group:    In this group patients will be asked to explore the value of being honest.  Patients will be guided to discuss their thoughts, feelings, and behaviors related to honesty and trusting in others. Patients will process together how trust and honesty relate to forming relationships with peers, family members, and self. Each patient will be challenged to identify and express feelings of being vulnerable. Patients will discuss reasons why people are dishonest and identify alternative outcomes if one was truthful (to self or others). This group will be process-oriented, with patients participating in exploration of their own experiences, giving and receiving support, and processing challenge from other group members.   Therapeutic Goals: 1. Patient will identify why honesty is important to relationships and how honesty overall affects relationships.  2. Patient will identify a situation where they lied or were lied too and the  feelings, thought process, and behaviors surrounding the situation 3. Patient will identify the meaning of being vulnerable, how that feels, and how that correlates to being honest with self and others. 4. Patient will identify situations where they could have told the truth, but instead lied and explain reasons of dishonesty.   Summary of Patient Progress: Pt invited to group but did not attend.     Therapeutic Modalities:   Cognitive Behavioral Therapy Solution Focused Therapy Motivational Interviewing Brief Therapy  Azan Maneri  CUEBAS-COLON, LCSW 07/15/2017 10:33 AM

## 2017-07-15 NOTE — Progress Notes (Signed)
Received Jeanne Hendricks this AM and immediately verbalized she hasn't voided since last night. The bladder scan indicated 579 cc of urine. Afterwards she reported urinating and feeling much better. She has been OOB in the milieu all morning and very needy. She endorsed feeling anxious and depressed, but denied feeling suicidal.
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
## 2017-07-16 MED ORDER — TEMAZEPAM 15 MG PO CAPS
30.0000 mg | ORAL_CAPSULE | Freq: Every day | ORAL | Status: DC
Start: 2017-07-16 — End: 2017-07-17
  Administered 2017-07-16: 30 mg via ORAL
  Filled 2017-07-16: qty 2

## 2017-07-16 MED ORDER — PALIPERIDONE ER 3 MG PO TB24
6.0000 mg | ORAL_TABLET | Freq: Every day | ORAL | Status: DC
  Administered 2017-07-16 – 2017-07-17 (×2): 6 mg via ORAL
  Filled 2017-07-16 (×2): qty 2

## 2017-07-16 NOTE — Plan of Care (Signed)
Pt. Sleeping, "good". Pt. Nutritional intake reports show improving appetite. Pt. Reports feeling, "good" today when asked if she feels better then yesterday. Pt. Compliant with medications this evening. Pt. Denies SI/HI. Pt. Verbally contracts for safety. Pt. Verbalizes she agrees she can remain safe while on the unit. Pt. Continues to not attend to hygiene and activities of daily living. Pt. Does not go to groups. Pt. Needs reinforcement on education provided.    Progressing Activity: Imbalance in normal sleep/wake cycle will improve 07/16/2017 0124 - Progressing by Lenox PondsStevens, Jaydn Moscato J, RN Health Behavior/Discharge Planning: Compliance with therapeutic regimen will improve 07/16/2017 0124 - Progressing by Lenox PondsStevens, Alva Broxson J, RN Safety: Ability to disclose and discuss suicidal ideas will improve 07/16/2017 0124 - Progressing by Lenox PondsStevens, Becker Christopher J, RN Self-Concept: Ability to verbalize positive feelings about self will improve 07/16/2017 0124 - Progressing by Lenox PondsStevens, Jerson Furukawa J, RN Activity: Sleeping patterns will improve 07/16/2017 0124 - Progressing by Lenox PondsStevens, Rylie Limburg J, RN

## 2017-07-16 NOTE — Progress Notes (Signed)
D:Pt denies SI/HI/AVH. Pt. Verbally contracts for safety. Pt. Verbalizes she agrees she can remain safe while on the unit. Pt. Continues to not attend to hygiene and activities of daily living. Pt. Sleeping, "good". Pt. Has been eating improved and has increased appetite it appears. Pt. Reports feeling, "good" today when asked if she feels better then yesterday. Pt. Upon presentation is frequently up at the nurses station asking multiple questions, but in a less tangential manor, but requires frequent redirection still. Pt. Is attention-seeking, childlike, and intrusive, but can be pleasant a majority of the time, despite behaviors. Pt. Will find random topics to obsess about and persist for several hours. Pt. Seems less anxious this evening.   A: Q x 15 minute observation checks were completed for safety. Patient was provided with education. Pt. Needs reinforcement on education provided. Patient was given scheduled medications. Patient was encourage to attend groups, participate in unit activities and continue with plan of care.   R:Patient is complaint with medication and unit procedures. Pt. Does not attend groups.            Patient slept for Estimated Hours of 7.45; Precautionary checks every 15 minutes for safety maintained, room free of safety hazards, patient sustains no injury or falls during this shift.

## 2017-07-16 NOTE — Progress Notes (Signed)
D:Pt denies SI/HI/AVH. Pt. Verbally contracts for safety. Pt. Verbalizes she agrees she can remain safe while on the unit. Pt. Continues to not attend to hygiene and activities of daily living. Pt. Sleeping, "good".Pt. Has been eating improved and has increased appetite it appears from last evening.Pt. Reports feeling, "good" today when asked if she feels better then yesterday.Pt. Upon presentation is frequently up at the nurses station asking multiple questions, but in an increased tangential/repetitive manor this evening, continueing to require frequent redirection still. Pt. Is attention-seeking, childlike, and intrusive, but can be pleasant a majority of the time, despite behaviors elevated more then last evening. Pt. Will find random topics to obsess about and persist for several hours.Pt. Seems anxious this evening and more persistent about random topics of obsession. Still redirectable with persistency from staff.     A: Q x 15 minute observation checks were completed for safety. Patient was provided with education.Pt. Needs reinforcement on education provided. Patient was given scheduled medications. Patient was encourage to attend groups, participate in unit activities and continue with plan of care.   R:Patient is complaint with medication and unit procedures. Pt. Does not attend groups.            Patient slept for Estimated Hours of7.45; Precautionary checks every 15 minutes for safety maintained, room free of safety hazards, patient sustains no injury or falls during this shift.

## 2017-07-16 NOTE — Plan of Care (Signed)
  Progressing Spiritual Needs Ability to function at adequate level 07/16/2017 1016 - Progressing by Weldon PickingJones, Ranie Chinchilla E, RN Activity: Interest or engagement in leisure activities will improve 07/16/2017 1016 - Progressing by Weldon PickingJones, Kiannah Grunow E, RN Imbalance in normal sleep/wake cycle will improve 07/16/2017 1016 - Progressing by Weldon PickingJones, Hurley Blevins E, RN Coping: Ability to cope will improve 07/16/2017 1016 - Progressing by Weldon PickingJones, Shawn Carattini E, RN Ability to verbalize feelings will improve 07/16/2017 1016 - Progressing by Weldon PickingJones, Ansley Mangiapane E, RN Health Behavior/Discharge Planning: Ability to make decisions will improve 07/16/2017 1016 - Progressing by Weldon PickingJones, Kalanie Fewell E, RN Compliance with therapeutic regimen will improve 07/16/2017 1016 - Progressing by Weldon PickingJones, Gae Bihl E, RN Safety: Ability to disclose and discuss suicidal ideas will improve 07/16/2017 1016 - Progressing by Weldon PickingJones, Karina Lenderman E, RN Activity: Sleeping patterns will improve 07/16/2017 1016 - Progressing by Weldon PickingJones, Danelia Snodgrass E, RN

## 2017-07-16 NOTE — [facility] Group Notes (Signed)
LCSW Group Therapy Note 07/16/2017 1:15pm  Type of Therapy and Topic: Group Therapy: Feelings Around Returning Home & Establishing a Supportive Framework and Supporting Oneself When Supports Not Available  Participation Level: Did Not Attend  Description of Group:  Patients first processed thoughts and feelings about upcoming discharge. These included fears of upcoming changes, lack of change, new living environments, judgements and expectations from others and overall stigma of mental health issues. The group then discussed the definition of a supportive framework, what that looks and feels like, and how do to discern it from an unhealthy non-supportive network. The group identified different types of supports as well as what to do when your family/friends are less than helpful or unavailable  Therapeutic Goals  1. Patient will identify one healthy supportive network that they can use at discharge. 2. Patient will identify one factor of a supportive framework and how to tell it from an unhealthy network. 3. Patient able to identify one coping skill to use when they do not have positive supports from others. 4. Patient will demonstrate ability to communicate their needs through discussion and/or role plays.  Summary of Patient Progress:    Therapeutic Modalities Cognitive Behavioral Therapy Motivational Interviewing   Jeanne Chowning  CUEBAS-COLON, LCSW 07/16/2017 11:40 AM

## 2017-07-16 NOTE — Plan of Care (Signed)
Pt. Sleeping, "good". Pt. Nutritional intake reports show improving appetite and eating good. Pt. Reports feeling, "good" today when asked if she feels better then yesterday. Pt. Compliant with medications this evening. Pt. Denies SI/HI. Pt. Verbally contracts for safety. Pt. Verbalizes she agrees she can remain safe while on the unit. Pt. Continues to not attend to hygiene and activities of daily living. Pt. Does not go to groups. Pt. Needs reinforcement on education provided.      Coping: Ability to verbalize feelings will improve 07/16/2017 2301 - Progressing by Lenox PondsStevens, Star Cheese J, RN   Progressing Activity: Imbalance in normal sleep/wake cycle will improve 07/16/2017 2259 - Progressing by Lenox PondsStevens, Aeryn Medici J, RN Health Behavior/Discharge Planning: Compliance with therapeutic regimen will improve 07/16/2017 2259 - Progressing by Lenox PondsStevens, Jaanai Salemi J, RN Safety: Ability to disclose and discuss suicidal ideas will improve 07/16/2017 2259 - Progressing by Lenox PondsStevens, Ellowyn Rieves J, RN Activity: Sleeping patterns will improve 07/16/2017 2259 - Progressing by Lenox PondsStevens, Jene Oravec J, RN   Not Progressing Spiritual Needs Ability to function at adequate level 07/16/2017 2259 - Not Progressing by Lenox PondsStevens, Vester Titsworth J, RN Activity: Interest or engagement in leisure activities will improve 07/16/2017 2259 - Not Progressing by Lenox PondsStevens, Oland Arquette J, RN Education: Utilization of techniques to improve thought processes will improve 07/16/2017 2259 - Not Progressing by Lenox PondsStevens, Courtland Coppa J, RN Knowledge of the prescribed therapeutic regimen will improve 07/16/2017 2259 - Not Progressing by Lenox PondsStevens, Jonta Gastineau J, RN Coping: Ability to cope will improve 07/16/2017 2259 - Not Progressing by Lenox PondsStevens, Alijah Akram J, RN Role Relationship: Ability to demonstrate positive changes in social behaviors and relationships will improve 07/16/2017 2259 - Not Progressing by Lenox PondsStevens, Kasim Mccorkle J, RN [facility] Participation in Recreation Therapeutic  Interventions STG-Other Recreation Therapy Goal (Specify) Description Patient will identify 3 positive coping skills to use post discharge x5 days.  07/16/2017 2259 - Not Progressing by Lenox PondsStevens, Stefanny Pieri J, RN

## 2017-07-16 NOTE — [facility] Group Notes (Signed)
LCSW Group Therapy Note 07/16/2017 1:15pm  Type of Therapy and Topic: Group Therapy: Feelings Around Returning Home & Establishing a Supportive Framework and Supporting Oneself When Supports Not Available  Participation Level: Did Not Attend  Description of Group:  Patients first processed thoughts and feelings about upcoming discharge. These included fears of upcoming changes, lack of change, new living environments, judgements and expectations from others and overall stigma of mental health issues. The group then discussed the definition of a supportive framework, what that looks and feels like, and how do to discern it from an unhealthy non-supportive network. The group identified different types of supports as well as what to do when your family/friends are less than helpful or unavailable  Therapeutic Goals  1. Patient will identify one healthy supportive network that they can use at discharge. 2. Patient will identify one factor of a supportive framework and how to tell it from an unhealthy network. 3. Patient able to identify one coping skill to use when they do not have positive supports from others. 4. Patient will demonstrate ability to communicate their needs through discussion and/or role plays.  Summary of Patient Progress:  Pt did not attend group.   Therapeutic Modalities Cognitive Behavioral Therapy Motivational Interviewing   Jeanne Hendricks  CUEBAS-COLON, LCSW 07/16/2017 2:56 PM

## 2017-07-16 NOTE — Progress Notes (Signed)
Patient reports x2 today that she has void.

## 2017-07-16 NOTE — Progress Notes (Signed)
Patient continues back and forth to nurses station to voice "I cant pee" patient informed staff on last pm that she had void and reported to this writer she has not void. Patient reports some type of powdered medicine helps her pee. Patient given cup of water in which she thinks powered medications are in. Patient immediately voids and thanks myself  And provider for giving her the medicine to help her void. Encouragement and support offered. Safety checks maintained. Medications given as prescribed. Patient remains safe on unit with q 15 min checks.

## 2017-07-16 NOTE — Progress Notes (Signed)
[facility] MD Progress Note  07/16/2017 9:26 AM Jeanne Hendricks  MRN:  [id number]  Subjective:   Jeanne Hendricks seems slightly better today. She is still very intrusive, childlike and requires frequent redirection but a little more relaxed. She still insists that she is unable to urinate. We scanned her bladder several times and there is no residual volume. There is no UTI. She was even given a dose of fosfomycin. The patient reports that her thinking is clearer and that she feels less anxious and restless. Hygiene is still very poor.  Treatment plan. We discontinue antipsychotics in fear of akathisia. Started low dose Librium and Propranolol. She is still on depakote and Luvox. Sleeps 4-5 hours with restoril 15 mg. Will increase to 30 mg nightly.  Social/disposition. Discharge with the mother. Follow up with [facility].  Principal Problem: Schizoaffective disorder, bipolar type (HCC) Diagnosis:   Patient Active Problem List   Diagnosis Date Noted  . Schizoaffective disorder, bipolar type (HCC) [F25.0] 07/04/2017    Priority: High  . OCD (obsessive compulsive disorder) [F42.9] 07/06/2017  . HTN (hypertension) [I10] 07/04/2017  . Diabetes (HCC) [E11.9] 07/04/2017  . Hypothyroidism [E03.9] 07/04/2017  . Suicidal ideation [R45.851] 07/04/2017   Total Time spent with patient: 20 minutes  Past Psychiatric History: schizophrenia.   Past Medical History:  Past Medical History:  Diagnosis Date  . Depression   . Diabetes mellitus without complication (HCC)   . Hypertension   . Thyroid disease    History reviewed. No pertinent surgical history. Family History: History reviewed. No pertinent family history. Family Psychiatric  History: bipolar, ADHD, schizophrenia. Social History:  Social History   Substance and Sexual Activity  Alcohol Use No  . Frequency: Never     Social History   Substance and Sexual Activity  Drug Use No    Social History   Socioeconomic History  . Marital status: Single     Spouse name: None  . Number of children: None  . Years of education: None  . Highest education level: None  Social Needs  . Financial resource strain: None  . Food insecurity - worry: None  . Food insecurity - inability: None  . Transportation needs - medical: None  . Transportation needs - non-medical: None  Occupational History  . None  Tobacco Use  . Smoking status: Former Games developer  . Smokeless tobacco: Former Engineer, water and Sexual Activity  . Alcohol use: No    Frequency: Never  . Drug use: No  . Sexual activity: None  Other Topics Concern  . None  Social History Narrative  . None   Additional Social History:                         Sleep: Fair  Appetite:  Fair  Current Medications: Current Facility-Administered Medications  Medication Dose Route Frequency Provider Last Rate Last Dose  . acetaminophen (TYLENOL) tablet 650 mg  650 mg Oral Q6H PRN Kaydyn Sayas B, MD   650 mg at 07/12/17 0832  . alum & mag hydroxide-simeth (MAALOX/MYLANTA) 200-200-20 MG/5ML suspension 30 mL  30 mL Oral Q4H PRN Hillel Card B, MD      . atorvastatin (LIPITOR) tablet 10 mg  10 mg Oral q1800 Neala Miggins B, MD   10 mg at 07/15/17 1648  . chlordiazePOXIDE (LIBRIUM) capsule 5 mg  5 mg Oral TID Dontavion Noxon B, MD   5 mg at 07/16/17 0757  . divalproex (DEPAKOTE) DR tablet 500  mg  500 mg Oral Q12H Cyrstal Leitz B, MD   500 mg at 07/16/17 0757  . fluvoxaMINE (LUVOX) tablet 200 mg  200 mg Oral QHS Melissaann Dizdarevic B, MD   200 mg at 07/15/17 2102  . levothyroxine (SYNTHROID, LEVOTHROID) tablet 100 mcg  100 mcg Oral QAC breakfast Teleah Villamar B, MD   100 mcg at 07/16/17 0757  . losartan (COZAAR) tablet 50 mg  50 mg Oral Daily Jaxan Michel B, MD   50 mg at 07/16/17 0757  . magnesium hydroxide (MILK OF MAGNESIA) suspension 30 mL  30 mL Oral Daily PRN Davette Nugent B, MD   30 mL at 07/14/17 1719  . metFORMIN (GLUCOPHAGE) tablet  500 mg  500 mg Oral BID WC Texie Tupou B, MD   500 mg at 07/16/17 0757  . phenazopyridine (PYRIDIUM) tablet 100 mg  100 mg Oral TID WC Adaley Kiene B, MD   100 mg at 07/16/17 0756  . propranolol (INDERAL) tablet 20 mg  20 mg Oral TID Jamesrobert Ohanesian B, MD   20 mg at 07/16/17 0757  . temazepam (RESTORIL) capsule 15 mg  15 mg Oral QHS Mariusz Jubb B, MD   15 mg at 07/15/17 2103    Lab Results:  Results for orders placed or performed during the hospital encounter of 07/05/17 (from the past 48 hour(s))  Urinalysis, Complete w Microscopic     Status: Abnormal   Collection Time: 07/14/17 12:32 PM  Result Value Ref Range   Color, Urine YELLOW (A) YELLOW   APPearance CLEAR (A) CLEAR   Specific Gravity, Urine 1.009 1.005 - 1.030   pH 6.0 5.0 - 8.0   Glucose, UA NEGATIVE NEGATIVE mg/dL   Hgb urine dipstick NEGATIVE NEGATIVE   Bilirubin Urine NEGATIVE NEGATIVE   Ketones, ur 5 (A) NEGATIVE mg/dL   Protein, ur NEGATIVE NEGATIVE mg/dL   Nitrite NEGATIVE NEGATIVE   Leukocytes, UA NEGATIVE NEGATIVE   RBC / HPF 0-5 0 - 5 RBC/hpf   WBC, UA 0-5 0 - 5 WBC/hpf   Bacteria, UA RARE (A) NONE SEEN   Squamous Epithelial / LPF 0-5 (A) NONE SEEN   Mucus PRESENT    Amorphous Crystal PRESENT     Comment: Performed at [facility], [address] [place], [place] [address]  Urine Culture     Status: None   Collection Time: 07/14/17 12:32 PM  Result Value Ref Range   Specimen Description      URINE, CLEAN CATCH Performed at [facility], [address] [place], [place] [address]    Special Requests      NONE Performed at [facility], [address], [place], [place] [address]    Culture      NO GROWTH Performed at [facility], [address] [place], [place] [address]    Report Status 07/15/2017 FINAL     Blood Alcohol level:  Lab Results  Component Value Date   ETH <10 07/03/2017    Metabolic Disorder Labs: Lab Results   Component Value Date   HGBA1C 5.1 07/06/2017   MPG 99.67 07/06/2017   No results found for: PROLACTIN Lab Results  Component Value Date   CHOL 103 07/06/2017   TRIG 112 07/06/2017   HDL 39 (L) 07/06/2017   CHOLHDL 2.6 07/06/2017   VLDL 22 07/06/2017   LDLCALC 42 07/06/2017    Physical Findings: AIMS: Facial and Oral Movements Muscles of Facial Expression: None, normal Lips and Perioral Area:  None, normal Jaw: None, normal Tongue: None, normal,Extremity Movements Upper (arms, wrists, hands, fingers): None, normal Lower (legs, knees, ankles, toes): None, normal, Trunk Movements Neck, shoulders, hips: None, normal, Overall Severity Severity of abnormal movements (highest score from questions above): None, normal Incapacitation due to abnormal movements: None, normal Patient's awareness of abnormal movements (rate only patient's report): No Awareness, Dental Status Current problems with teeth and/or dentures?: No Does patient usually wear dentures?: No  CIWA:    COWS:     Musculoskeletal: Strength & Muscle Tone: within normal limits Gait & Station: normal Patient leans: N/A  Psychiatric Specialty Exam: Physical Exam  Nursing note and vitals reviewed. Psychiatric: Her speech is normal. Her mood appears anxious. She is hyperactive. Thought content is paranoid and delusional. Cognition and memory are impaired. She expresses impulsivity.    Review of Systems  Genitourinary: Positive for dysuria.  Neurological: Negative.   Psychiatric/Behavioral: The patient is nervous/anxious and has insomnia.   All other systems reviewed and are negative.   Blood pressure 111/69, pulse 64, temperature 97.6 F (36.4 C), temperature source Oral, resp. rate 18, height 5\' 5"  (1.651 m), weight 66.2 kg (146 lb), SpO2 100 %.Body mass index is 24.3 kg/m.  General Appearance: Disheveled  Eye Contact:  Good  Speech:  Clear and Coherent  Volume:  Normal  Mood:  Anxious  Affect:  Congruent   Thought Process:  Linear  Orientation:  Full (Time, Place, and Person)  Thought Content:  Delusions and Paranoid Ideation  Suicidal Thoughts:  No  Homicidal Thoughts:  No  Memory:  Immediate;   Fair Recent;   Fair Remote;   Fair  Judgement:  Poor  Insight:  Lacking  Psychomotor Activity:  Increased  Concentration:  Concentration: Fair and Attention Span: Fair  Recall:  FiservFair  Fund of Knowledge:  Fair  Language:  Fair  Akathisia:  No  Handed:  Right  AIMS (if indicated):     Assets:  Communication Skills Desire for Improvement Housing Physical Health Resilience Social Support  ADL's:  Intact  Cognition:  WNL  Sleep:  Number of Hours: 5     Treatment Plan Summary: Daily contact with patient to assess and evaluate symptoms and progress in treatment and Medication management   Jeanne Hendricks is a 47 year old female with a history of bipolar disorder admitted for psychotic break.  #Suicidal ideation, resolved -patient able to contract for safety  #Mood and psychosis -discontinue Zyprexaand Invega -start Propranolol 20 mg TID for akathisia -continueDepakote 500 mgBID -increase Restorilto 30 mg nightly  #OCD -continue Luvox200 mg nightly -Librium 5 mg QID for 3 days  #UTI -UA and UCx -fosfomycin 3 g once -pyridium TID -bladder scan   #Hypothyroidism -continueSynthroid 100 mcg  #HTN -Cozaar 50 mg daily  #Dyslipidemia -Lipitor 10 mg daily  #Metabolic syndrome monitoring -Lipid panel, TSH, HgbA1C are unremarkable -EKG,QTc 470 -pregnancy test is negative  #Disposition -discharge with the mother [facility]    Jeanne LineaJolanta Cyncere Ruhe, MD 07/16/2017, 9:26 AM

## 2017-07-17 MED ORDER — ENSURE ENLIVE PO LIQD
237.0000 mL | Freq: Three times a day (TID) | ORAL | Status: DC
  Administered 2017-07-17 – 2017-08-02 (×27): 237 mL via ORAL

## 2017-07-17 MED ORDER — TEMAZEPAM 15 MG PO CAPS
15.0000 mg | ORAL_CAPSULE | Freq: Every evening | ORAL | Status: DC | PRN
  Administered 2017-07-17: 15 mg via ORAL
  Filled 2017-07-17: qty 1

## 2017-07-17 MED ORDER — DOXEPIN HCL 100 MG PO CAPS
100.0000 mg | ORAL_CAPSULE | Freq: Every day | ORAL | Status: DC
  Administered 2017-07-17: 100 mg via ORAL
  Filled 2017-07-17 (×2): qty 1

## 2017-07-17 MED ORDER — CHLORDIAZEPOXIDE HCL 5 MG PO CAPS
5.0000 mg | ORAL_CAPSULE | Freq: Three times a day (TID) | ORAL | Status: AC
  Administered 2017-07-17 – 2017-07-20 (×8): 5 mg via ORAL
  Filled 2017-07-17 (×9): qty 1

## 2017-07-17 NOTE — Plan of Care (Signed)
Patient remains intrusive and impulsive. Patient walks the unit asking staff the same questions repeatedly. Compliant with medications and meals. Patient encouraged to bathe today, Patient responded, "I went in and took a wash off, I don't want to shower yet. Milieu remains safe with q 15 minute safety checks.

## 2017-07-17 NOTE — [facility] Group Notes (Signed)
07/17/2017 9:30AM  Type of Therapy and Topic:  Group Therapy:  Overcoming Obstacles  Participation Level:  Did Not Attend    Description of Group:    In this group patients will be encouraged to explore what they see as obstacles to their own wellness and recovery. They will be guided to discuss their thoughts, feelings, and behaviors related to these obstacles. The group will process together ways to cope with barriers, with attention given to specific choices patients can make. Each patient will be challenged to identify changes they are motivated to make in order to overcome their obstacles. This group will be process-oriented, with patients participating in exploration of their own experiences as well as giving and receiving support and challenge from other group members.   Therapeutic Goals: 1. Patient will identify personal and current obstacles as they relate to admission. 2. Patient will identify barriers that currently interfere with their wellness or overcoming obstacles.  3. Patient will identify feelings, thought process and behaviors related to these barriers. 4. Patient will identify two changes they are willing to make to overcome these obstacles:      Summary of Patient Progress Patient was encouraged and invited to attend group. Patient did not attend group. Social worker will continue to encourage group participation in the future.      Therapeutic Modalities:   Cognitive Behavioral Therapy Solution Focused Therapy Motivational Interviewing Relapse Prevention Therapy    Johny ShearsCassandra Angelisse Riso MSW, LCSWA 07/17/2017 10:31 AM

## 2017-07-17 NOTE — Progress Notes (Addendum)
[facility] MD Progress Note  07/17/2017 8:02 PM Jeanne Hendricks  MRN:  [id number]  Subjective:    Jeanne Hendricks no longer is on antipsychotics but all day today she is restless and very intrusive. Comes to my office every few minutes asking to e discharged. This changes drastically, when we offer her Librium 5 mg again.  Treatment plan. Continue librium, Propranolol and Luvox for anxiety. Hold antipsychotics. We will substitute Restoril at night with Sinequan.  Social/disposition. Discharge with family. Follow up with [facility].  Principal Problem: Schizoaffective disorder, bipolar type (HCC) Diagnosis:   Patient Active Problem List   Diagnosis Date Noted  . Schizoaffective disorder, bipolar type (HCC) [F25.0] 07/04/2017    Priority: High  . OCD (obsessive compulsive disorder) [F42.9] 07/06/2017  . HTN (hypertension) [I10] 07/04/2017  . Diabetes (HCC) [E11.9] 07/04/2017  . Hypothyroidism [E03.9] 07/04/2017  . Suicidal ideation [R45.851] 07/04/2017   Total Time spent with patient: 20 minutes  Past Psychiatric History: bipolar disorder.  Past Medical History:  Past Medical History:  Diagnosis Date  . Depression   . Diabetes mellitus without complication (HCC)   . Hypertension   . Thyroid disease    History reviewed. No pertinent surgical history. Family History: History reviewed. No pertinent family history. Family Psychiatric  History: bipolar, schizophrenia, ADHD. Social History:  Social History   Substance and Sexual Activity  Alcohol Use No  . Frequency: Never     Social History   Substance and Sexual Activity  Drug Use No    Social History   Socioeconomic History  . Marital status: Single    Spouse name: None  . Number of children: None  . Years of education: None  . Highest education level: None  Social Needs  . Financial resource strain: None  . Food insecurity - worry: None  . Food insecurity - inability: None  . Transportation needs - medical: None  .  Transportation needs - non-medical: None  Occupational History  . None  Tobacco Use  . Smoking status: Former Games developer  . Smokeless tobacco: Former Engineer, water and Sexual Activity  . Alcohol use: No    Frequency: Never  . Drug use: No  . Sexual activity: None  Other Topics Concern  . None  Social History Narrative  . None   Additional Social History:                         Sleep: Fair  Appetite:  Poor  Current Medications: Current Facility-Administered Medications  Medication Dose Route Frequency Provider Last Rate Last Dose  . acetaminophen (TYLENOL) tablet 650 mg  650 mg Oral Q6H PRN Diasha Castleman B, MD   650 mg at 07/12/17 0832  . alum & mag hydroxide-simeth (MAALOX/MYLANTA) 200-200-20 MG/5ML suspension 30 mL  30 mL Oral Q4H PRN Khaidyn Staebell B, MD      . atorvastatin (LIPITOR) tablet 10 mg  10 mg Oral q1800 Olivia Royse B, MD   10 mg at 07/17/17 1707  . chlordiazePOXIDE (LIBRIUM) capsule 5 mg  5 mg Oral TID Emrah Ariola B, MD   5 mg at 07/17/17 1655  . divalproex (DEPAKOTE) DR tablet 500 mg  500 mg Oral Q12H Zavior Thomason B, MD   500 mg at 07/17/17 0809  . doxepin (SINEQUAN) capsule 100 mg  100 mg Oral QHS Davida Falconi B, MD      . feeding supplement (ENSURE ENLIVE) (ENSURE ENLIVE) liquid 237 mL  237 mL Oral  TID BM Sufyaan Palma B, MD   237 mL at 07/17/17 1410  . fluvoxaMINE (LUVOX) tablet 200 mg  200 mg Oral QHS Doc Mandala B, MD   200 mg at 07/16/17 2100  . levothyroxine (SYNTHROID, LEVOTHROID) tablet 100 mcg  100 mcg Oral QAC breakfast Christie Copley B, MD   100 mcg at 07/17/17 0809  . losartan (COZAAR) tablet 50 mg  50 mg Oral Daily Fatmata Legere B, MD   50 mg at 07/17/17 0809  . magnesium hydroxide (MILK OF MAGNESIA) suspension 30 mL  30 mL Oral Daily PRN Nivan Melendrez B, MD   30 mL at 07/14/17 1719  . metFORMIN (GLUCOPHAGE) tablet 500 mg  500 mg Oral BID WC Zaela Graley B, MD   500  mg at 07/17/17 1655  . propranolol (INDERAL) tablet 20 mg  20 mg Oral TID Daneshia Tavano B, MD   20 mg at 07/17/17 1655  . temazepam (RESTORIL) capsule 15 mg  15 mg Oral QHS PRN Nhyla Nappi B, MD        Lab Results: No results found for this or any previous visit (from the past 48 hour(s)).  Blood Alcohol level:  Lab Results  Component Value Date   ETH <10 07/03/2017    Metabolic Disorder Labs: Lab Results  Component Value Date   HGBA1C 5.1 07/06/2017   MPG 99.67 07/06/2017   No results found for: PROLACTIN Lab Results  Component Value Date   CHOL 103 07/06/2017   TRIG 112 07/06/2017   HDL 39 (L) 07/06/2017   CHOLHDL 2.6 07/06/2017   VLDL 22 07/06/2017   LDLCALC 42 07/06/2017    Physical Findings: AIMS: Facial and Oral Movements Muscles of Facial Expression: None, normal Lips and Perioral Area: None, normal Jaw: None, normal Tongue: None, normal,Extremity Movements Upper (arms, wrists, hands, fingers): None, normal Lower (legs, knees, ankles, toes): None, normal, Trunk Movements Neck, shoulders, hips: None, normal, Overall Severity Severity of abnormal movements (highest score from questions above): None, normal Incapacitation due to abnormal movements: None, normal Patient's awareness of abnormal movements (rate only patient's report): No Awareness, Dental Status Current problems with teeth and/or dentures?: No Does patient usually wear dentures?: No  CIWA:    COWS:     Musculoskeletal: Strength & Muscle Tone: within normal limits Gait & Station: normal Patient leans: N/A  Psychiatric Specialty Exam: Physical Exam  Nursing note and vitals reviewed. Psychiatric: Her mood appears anxious. Her speech is rapid and/or pressured. She is hyperactive. Thought content is paranoid. Cognition and memory are impaired. She expresses impulsivity.    Review of Systems  Neurological: Negative.   Psychiatric/Behavioral: The patient is nervous/anxious.   All  other systems reviewed and are negative.   Blood pressure 108/78, pulse 80, temperature 98 F (36.7 C), temperature source Oral, resp. rate 18, height 5\' 5"  (1.651 m), weight 66.2 kg (146 lb), SpO2 100 %.Body mass index is 24.3 kg/m.  General Appearance: Disheveled  Eye Contact:  Good  Speech:  Clear and Coherent and Pressured  Volume:  Increased  Mood:  Anxious  Affect:  Inappropriate  Thought Process:  Disorganized and Descriptions of Associations: Tangential  Orientation:  Full (Time, Place, and Person)  Thought Content:  Obsessions and Rumination  Suicidal Thoughts:  No  Homicidal Thoughts:  No  Memory:  Immediate;   Poor Recent;   Poor Remote;   Poor  Judgement:  Poor  Insight:  Lacking  Psychomotor Activity:  Increased  Concentration:  Concentration: Poor and  Attention Span: Poor  Recall:  Poor  Fund of Knowledge:  Poor  Language:  Poor  Akathisia:  No  Handed:  Right  AIMS (if indicated):     Assets:  Communication Skills Desire for Improvement Housing Physical Health Resilience Social Support  ADL's:  Intact  Cognition:  WNL  Sleep:  Number of Hours: 7.45     Treatment Plan Summary: Daily contact with patient to assess and evaluate symptoms and progress in treatment and Medication management   Jeanne Hendricks is a 47 year old female with a history of bipolar disorder admitted for psychotic break.  #Suicidal ideation, resolved -patient able to contract for safety  #Mood and psychosis -continue Zyprexaand Invega -continue Propranolol 20 mg TID for akathisia -continueDepakote 500 mgBID -Restoril15 mg PRN only -start Sinequan 100 mg nightly  #OCD -continue Luvox200 mg nightly -continue Librium 5 mgTID  #UTI -UA and UCx -fosfomycin 3 g once  #Hypothyroidism -continueSynthroid 100 mcg  #HTN -Cozaar 50 mg daily  #Dyslipidemia -Lipitor 10 mg daily  #Metabolic syndrome monitoring -Lipid panel, TSH, HgbA1C are unremarkable -EKG,QTc  470 -pregnancy test is negative  #Disposition -discharge with the mother [facility]  I certify that the services received since the previous certification/recertification were and continue to be medically necessary as the treatment provided can be reasonably expected to improve the patient's condition; the medical record documents that the services furnished were intensive treatment services or their equivalent services, and this patient continues to need, on a daily basis, active treatment furnished directly by or requiring the supervision of inpatient psychiatric personnel.    Kristine LineaJolanta Tavaria Mackins, MD 07/17/2017, 8:02 PM

## 2017-07-18 MED ORDER — DIPHENHYDRAMINE HCL 25 MG PO CAPS
50.0000 mg | ORAL_CAPSULE | Freq: Four times a day (QID) | ORAL | Status: DC | PRN
  Administered 2017-07-18 – 2017-07-19 (×3): 50 mg via ORAL
  Filled 2017-07-18 (×3): qty 2

## 2017-07-18 MED ORDER — SERTRALINE HCL 100 MG PO TABS: 300.0000 mg | ORAL_TABLET | Freq: Every day | ORAL | Status: DC

## 2017-07-18 MED ORDER — FLUVOXAMINE MALEATE 50 MG PO TABS
200.0000 mg | ORAL_TABLET | Freq: Every day | ORAL | Status: DC
  Administered 2017-07-18 – 2017-08-02 (×16): 200 mg via ORAL
  Filled 2017-07-18 (×16): qty 4

## 2017-07-18 MED ORDER — TEMAZEPAM 15 MG PO CAPS
15.0000 mg | ORAL_CAPSULE | Freq: Every day | ORAL | Status: DC
  Administered 2017-07-18: 15 mg via ORAL
  Filled 2017-07-18: qty 1

## 2017-07-18 NOTE — Plan of Care (Signed)
Patient slept for Estimated Hours of 7.15; Precautionary checks every 15 minutes for safety maintained, room free of safety hazards, patient sustains no injury or falls during this shift.  

## 2017-07-18 NOTE — Progress Notes (Addendum)
[facility] MD Progress Note  07/18/2017 6:27 PM Jeanne Hendricks  MRN:  [id number]  Subjective:   Jeanne Hendricks is extremely anxious and restless today. She missed her Librium dose due to low blood pressure. She has been banging on my door constantly. She keeps asking to go home over and over again.  Case discussed in depertmental meeting. There is consensus that the patient benefits from benzodiazepines. Securing a follow up will be a problem.   Spoke with the mother who has not been able to apply for Medicaid or SSD. She was toled the patient needs to do it in person.  Treatment plan. We will continue Depakote. We replace Luvox 200 mg with 300 mg of Zoloft. We will continue Librium. She has been off antipsychotics for 4 days now. It is unlikely akathisia. She is on Propranolol. I asked Dr. Toni Amend, who did examine the patient last week, for a second oppinion.  Social/disposition. Uninsured and without income. Will stay with her mother in [place]. Follow up [facility]BE. She will not be a good fit for [facility] if she stays on benzodiazepines.  Principal Problem: Schizoaffective disorder, bipolar type (HCC) Diagnosis:   Patient Active Problem List   Diagnosis Date Noted  . Schizoaffective disorder, bipolar type (HCC) [F25.0] 07/04/2017    Priority: High  . OCD (obsessive compulsive disorder) [F42.9] 07/06/2017  . HTN (hypertension) [I10] 07/04/2017  . Diabetes (HCC) [E11.9] 07/04/2017  . Hypothyroidism [E03.9] 07/04/2017  . Suicidal ideation [R45.851] 07/04/2017   Total Time spent with patient: 30 minutes  Past Psychiatric History:  Bipolar disorder  Past Medical History:  Past Medical History:  Diagnosis Date  . Depression   . Diabetes mellitus without complication (HCC)   . Hypertension   . Thyroid disease    History reviewed. No pertinent surgical history. Family History: History reviewed. No pertinent family history. Family Psychiatric  History: bipolar, schizophrenia, ADHD Social History:   Social History   Substance and Sexual Activity  Alcohol Use No  . Frequency: Never     Social History   Substance and Sexual Activity  Drug Use No    Social History   Socioeconomic History  . Marital status: Single    Spouse name: None  . Number of children: None  . Years of education: None  . Highest education level: None  Social Needs  . Financial resource strain: None  . Food insecurity - worry: None  . Food insecurity - inability: None  . Transportation needs - medical: None  . Transportation needs - non-medical: None  Occupational History  . None  Tobacco Use  . Smoking status: Former Games developer  . Smokeless tobacco: Former Engineer, water and Sexual Activity  . Alcohol use: No    Frequency: Never  . Drug use: No  . Sexual activity: None  Other Topics Concern  . None  Social History Narrative  . None   Additional Social History:                         Sleep: Fair  Appetite:  Fair  Current Medications: Current Facility-Administered Medications  Medication Dose Route Frequency Provider Last Rate Last Dose  . acetaminophen (TYLENOL) tablet 650 mg  650 mg Oral Q6H PRN Faolan Springfield B, MD   650 mg at 07/12/17 0832  . alum & mag hydroxide-simeth (MAALOX/MYLANTA) 200-200-20 MG/5ML suspension 30 mL  30 mL Oral Q4H PRN Latonyia Lopata B, MD      . atorvastatin (  LIPITOR) tablet 10 mg  10 mg Oral q1800 Kaidance Pantoja B, MD   10 mg at 07/18/17 1707  . chlordiazePOXIDE (LIBRIUM) capsule 5 mg  5 mg Oral TID Moris Ratchford B, MD   5 mg at 07/18/17 1640  . divalproex (DEPAKOTE) DR tablet 500 mg  500 mg Oral Q12H Graylyn Bunney B, MD   500 mg at 07/18/17 0734  . doxepin (SINEQUAN) capsule 100 mg  100 mg Oral QHS Ryne Mctigue B, MD   100 mg at 07/17/17 2113  . feeding supplement (ENSURE ENLIVE) (ENSURE ENLIVE) liquid 237 mL  237 mL Oral TID BM Malie Kashani B, MD   237 mL at 07/18/17 1015  . fluvoxaMINE (LUVOX) tablet 200 mg   200 mg Oral QHS Castor Gittleman B, MD   200 mg at 07/17/17 2103  . levothyroxine (SYNTHROID, LEVOTHROID) tablet 100 mcg  100 mcg Oral QAC breakfast Marea Reasner B, MD   100 mcg at 07/18/17 0734  . losartan (COZAAR) tablet 50 mg  50 mg Oral Daily Zaeden Lastinger B, MD   50 mg at 07/18/17 0734  . magnesium hydroxide (MILK OF MAGNESIA) suspension 30 mL  30 mL Oral Daily PRN Britlyn Martine B, MD   30 mL at 07/14/17 1719  . metFORMIN (GLUCOPHAGE) tablet 500 mg  500 mg Oral BID WC Khan Chura B, MD   500 mg at 07/18/17 1640  . propranolol (INDERAL) tablet 20 mg  20 mg Oral TID Kenric Ginger B, MD   20 mg at 07/18/17 1640  . temazepam (RESTORIL) capsule 15 mg  15 mg Oral QHS PRN Braelon Sprung B, MD   15 mg at 07/17/17 2158    Lab Results: No results found for this or any previous visit (from the past 48 hour(s)).  Blood Alcohol level:  Lab Results  Component Value Date   ETH <10 07/03/2017    Metabolic Disorder Labs: Lab Results  Component Value Date   HGBA1C 5.1 07/06/2017   MPG 99.67 07/06/2017   No results found for: PROLACTIN Lab Results  Component Value Date   CHOL 103 07/06/2017   TRIG 112 07/06/2017   HDL 39 (L) 07/06/2017   CHOLHDL 2.6 07/06/2017   VLDL 22 07/06/2017   LDLCALC 42 07/06/2017    Physical Findings: AIMS: Facial and Oral Movements Muscles of Facial Expression: None, normal Lips and Perioral Area: None, normal Jaw: None, normal Tongue: None, normal,Extremity Movements Upper (arms, wrists, hands, fingers): None, normal Lower (legs, knees, ankles, toes): None, normal, Trunk Movements Neck, shoulders, hips: None, normal, Overall Severity Severity of abnormal movements (highest score from questions above): None, normal Incapacitation due to abnormal movements: None, normal Patient's awareness of abnormal movements (rate only patient's report): No Awareness, Dental Status Current problems with teeth and/or dentures?:  No Does patient usually wear dentures?: No  CIWA:    COWS:     Musculoskeletal: Strength & Muscle Tone: within normal limits Gait & Station: normal Patient leans: N/A  Psychiatric Specialty Exam: Physical Exam  Nursing note and vitals reviewed. Psychiatric: Her mood appears anxious. Her affect is inappropriate. Her speech is rapid and/or pressured. She is hyperactive. Thought content is paranoid. Cognition and memory are impaired. She expresses impulsivity.    Review of Systems  Neurological: Negative.   Psychiatric/Behavioral: The patient is nervous/anxious.   All other systems reviewed and are negative.   Blood pressure 120/80, pulse 80, temperature 98 F (36.7 C), temperature source Oral, resp. rate 18, height 5\' 5"  (1.651  m), weight 66.2 kg (146 lb), SpO2 99 %.Body mass index is 24.3 kg/m.  General Appearance: Fairly Groomed  Eye Contact:  Good  Speech:  Pressured  Volume:  Increased  Mood:  Anxious  Affect:  Inappropriate  Thought Process:  Irrelevant  Orientation:  Full (Time, Place, and Person)  Thought Content:  Illogical, Obsessions and Rumination  Suicidal Thoughts:  No  Homicidal Thoughts:  No  Memory:  Immediate;   Fair Recent;   Fair Remote;   Fair  Judgement:  Poor  Insight:  Lacking  Psychomotor Activity:  Increased  Concentration:  Concentration: Fair and Attention Span: Fair  Recall:  Fis[employer]Fair  Fund of Knowledge:  Fair  Language:  Fair  Akathisia:  No  Handed:  Right  AIMS (if indicated):     Assets:  Communication Skills Desire for Improvement Housing Physical Health Resilience Social Support  ADL's:  Intact  Cognition:  WNL  Sleep:  Number of Hours: 7.15     Treatment Plan Summary: Daily contact with patient to assess and evaluate symptoms and progress in treatment and Medication management   Jeanne Hendricks is a 47 year old female with a history of bipolar disorder admitted for psychotic break.  #Suicidal ideation, resolved -patient able to  contract for safety  #Mood and psychosis -discontinue Zyprexaand Invega -continue Propranolol 20 mg TID for akathisia -continueDepakote 500 mgBID -back to Restoril15 mg nightly   #OCD -discontinue Luvox200 mg nightly -start Zoloft 300 mg nightly -continue Librium 5 mgTID  #UTI -UA and UCx -fosfomycin 3 g once  #Hypothyroidism -continueSynthroid 100 mcg  #HTN, BP low -discontinue Cozaar 50 mg daily  #Dyslipidemia -Lipitor 10 mg daily  #Metabolic syndrome monitoring -Lipid panel, TSH, HgbA1C are unremarkable -EKG,QTc 470 -pregnancy test is negative  #Disposition -discharge with the mother [facility]    Kristine LineaJolanta Pryce Folts, MD 07/18/2017, 6:27 PM

## 2017-07-18 NOTE — [facility] Group Notes (Signed)
07/18/2017 1PM  Type of Therapy/Topic:  Group Therapy:  Feelings about Diagnosis  Participation Level:  Did Not Attend   Description of Group:   This group will allow patients to explore their thoughts and feelings about diagnoses they have received. Patients will be guided to explore their level of understanding and acceptance of these diagnoses. Facilitator will encourage patients to process their thoughts and feelings about the reactions of others to their diagnosis and will guide patients in identifying ways to discuss their diagnosis with significant others in their lives. This group will be process-oriented, with patients participating in exploration of their own experiences, giving and receiving support, and processing challenge from other group members.   Therapeutic Goals: 1. Patient will demonstrate understanding of diagnosis as evidenced by identifying two or more symptoms of the disorder 2. Patient will be able to express two feelings regarding the diagnosis 3. Patient will demonstrate their ability to communicate their needs through discussion and/or role play  Summary of Patient Progress: Patient was encouraged and invited to attend group. Patient did not attend group. Social worker will continue to encourage group participation in the future.        Therapeutic Modalities:   Cognitive Behavioral Therapy Brief Therapy Feelings Identification    Jeanne ShearsCassandra  Jeanne Oscar, LCSW 07/18/2017 1:47 PM

## 2017-07-18 NOTE — Progress Notes (Signed)
Patient ID: Jeanne Hendricks, female   DOB: Jan 17, 1971, 47 y.o.   MRN: [id number] Restless, hyperactive, frequently coming to the nurses' station, perseverating by repeatedly saying "I have a bladder problem......" at the start of shift "I can't sleep ...." PRN Restoril 15 mg PO given at 2158 "What's the name of the sleeping medication you gave me? How long will it take before I go to sleep? I can't sleep ...." Patient showered but did not wash her hair, denied SI/HI/AVH.

## 2017-07-18 NOTE — Plan of Care (Incomplete)
Pt denies SI/HI/AVH. Pt. Verbally contracts for safety. Pt. Verbalizes she agrees she can remain safe while on the unit. Pt. Continues to not attend to hygiene and activities of daily living. Patient Has been eating improved and has increased appetite it appears from last evening. Pt. Reports feeling, "good" today when asked if she feels better then yesterday. Pt. Upon presentation is frequently up at the nurses station asking multiple questions, but in an increased tangential/repetitive manor this evening, continueing to require frequent redirection still. Pt. Is attention-seeking, childlike, and intrusive, but can be pleasant a majority of the time, despite behaviors elevated more then last evening. Pt. Will find random topics to obsess about and persist for several hours. Pt. Seems anxious this evening and more persistent about random topics of obsession. Still redirectable with persistency from staff.

## 2017-07-19 MED ORDER — DIPHENHYDRAMINE HCL 25 MG PO CAPS
25.0000 mg | ORAL_CAPSULE | Freq: Four times a day (QID) | ORAL | Status: DC | PRN
Start: 2017-07-19 — End: 2017-07-20
  Administered 2017-07-19 – 2017-07-20 (×2): 25 mg via ORAL
  Filled 2017-07-19 (×2): qty 1

## 2017-07-19 MED ORDER — TEMAZEPAM 7.5 MG PO CAPS
7.5000 mg | ORAL_CAPSULE | Freq: Every day | ORAL | Status: DC
  Administered 2017-07-19 – 2017-08-02 (×15): 7.5 mg via ORAL
  Filled 2017-07-19 (×15): qty 1

## 2017-07-19 NOTE — Progress Notes (Signed)
[facility] MD Progress Note  07/19/2017 2:20 PM Jeanne Hendricks  MRN:  [id number]  Subjective:    Ms. Deckman was extremely restless and intrussive all day yesterday. In the evening, she reported itching skin rash in the popliteal area. She was given Benadryl. Today, for reasons not understood, she is much colmer, not at all intrusive and able to hold a sensible conversation. She feels very sleepy. She has no complaints and is asking to go home.  Spoke with Dr. Toni Amend who was following up on his initial ECT consultation. The patient refused to talk today deu to somnolence.  Treatment plan. We continue Depakote and Librium for mood stabilization. She takes Luvox for anxiety. She is on Restoril for sleep.   Social/disposition. She will be discharged with family. Follow up with a private psychiatrist if she is to remain on benzodiazepines.  Principal Problem: Schizoaffective disorder, bipolar type (HCC) Diagnosis:   Patient Active Problem List   Diagnosis Date Noted  . Schizoaffective disorder, bipolar type (HCC) [F25.0] 07/04/2017    Priority: High  . OCD (obsessive compulsive disorder) [F42.9] 07/06/2017  . HTN (hypertension) [I10] 07/04/2017  . Diabetes (HCC) [E11.9] 07/04/2017  . Hypothyroidism [E03.9] 07/04/2017  . Suicidal ideation [R45.851] 07/04/2017   Total Time spent with patient: 30 minutes  Past Psychiatric History: bipolar disorder  Past Medical History:  Past Medical History:  Diagnosis Date  . Depression   . Diabetes mellitus without complication (HCC)   . Hypertension   . Thyroid disease    History reviewed. No pertinent surgical history. Family History: History reviewed. No pertinent family history. Family Psychiatric  History: bipolar, schizophrenia, ADHD Social History:  Social History   Substance and Sexual Activity  Alcohol Use No  . Frequency: Never     Social History   Substance and Sexual Activity  Drug Use No    Social History   Socioeconomic History   . Marital status: Single    Spouse name: None  . Number of children: None  . Years of education: None  . Highest education level: None  Social Needs  . Financial resource strain: None  . Food insecurity - worry: None  . Food insecurity - inability: None  . Transportation needs - medical: None  . Transportation needs - non-medical: None  Occupational History  . None  Tobacco Use  . Smoking status: Former Games developer  . Smokeless tobacco: Former Engineer, water and Sexual Activity  . Alcohol use: No    Frequency: Never  . Drug use: No  . Sexual activity: None  Other Topics Concern  . None  Social History Narrative  . None   Additional Social History:                         Sleep: Fair  Appetite:  Fair  Current Medications: Current Facility-Administered Medications  Medication Dose Route Frequency Provider Last Rate Last Dose  . acetaminophen (TYLENOL) tablet 650 mg  650 mg Oral Q6H PRN Tahra Hitzeman B, MD   650 mg at 07/12/17 0832  . alum & mag hydroxide-simeth (MAALOX/MYLANTA) 200-200-20 MG/5ML suspension 30 mL  30 mL Oral Q4H PRN Anaid Haney B, MD      . atorvastatin (LIPITOR) tablet 10 mg  10 mg Oral q1800 Aalyah Mansouri B, MD   10 mg at 07/18/17 1707  . chlordiazePOXIDE (LIBRIUM) capsule 5 mg  5 mg Oral TID Minnah Llamas B, MD   5 mg at 07/19/17 1153  .  diphenhydrAMINE (BENADRYL) capsule 50 mg  50 mg Oral Q6H PRN Rhapsody Wolven B, MD   50 mg at 07/19/17 1306  . divalproex (DEPAKOTE) DR tablet 500 mg  500 mg Oral Q12H Caysen Whang B, MD   500 mg at 07/19/17 0858  . feeding supplement (ENSURE ENLIVE) (ENSURE ENLIVE) liquid 237 mL  237 mL Oral TID BM Elih Mooney B, MD   237 mL at 07/19/17 1155  . fluvoxaMINE (LUVOX) tablet 200 mg  200 mg Oral QHS Amie Cowens B, MD   200 mg at 07/18/17 2102  . levothyroxine (SYNTHROID, LEVOTHROID) tablet 100 mcg  100 mcg Oral QAC breakfast Dashan Chizmar B, MD   100 mcg at  07/19/17 0858  . magnesium hydroxide (MILK OF MAGNESIA) suspension 30 mL  30 mL Oral Daily PRN Jaevion Goto B, MD   30 mL at 07/14/17 1719  . temazepam (RESTORIL) capsule 15 mg  15 mg Oral QHS Korena Nass B, MD   15 mg at 07/18/17 2100    Lab Results: No results found for this or any previous visit (from the past 48 hour(s)).  Blood Alcohol level:  Lab Results  Component Value Date   ETH <10 07/03/2017    Metabolic Disorder Labs: Lab Results  Component Value Date   HGBA1C 5.1 07/06/2017   MPG 99.67 07/06/2017   No results found for: PROLACTIN Lab Results  Component Value Date   CHOL 103 07/06/2017   TRIG 112 07/06/2017   HDL 39 (L) 07/06/2017   CHOLHDL 2.6 07/06/2017   VLDL 22 07/06/2017   LDLCALC 42 07/06/2017    Physical Findings: AIMS: Facial and Oral Movements Muscles of Facial Expression: None, normal Lips and Perioral Area: None, normal Jaw: None, normal Tongue: None, normal,Extremity Movements Upper (arms, wrists, hands, fingers): None, normal Lower (legs, knees, ankles, toes): None, normal, Trunk Movements Neck, shoulders, hips: None, normal, Overall Severity Severity of abnormal movements (highest score from questions above): None, normal Incapacitation due to abnormal movements: None, normal Patient's awareness of abnormal movements (rate only patient's report): No Awareness, Dental Status Current problems with teeth and/or dentures?: No Does patient usually wear dentures?: No  CIWA:    COWS:     Musculoskeletal: Strength & Muscle Tone: within normal limits Gait & Station: normal Patient leans: N/A  Psychiatric Specialty Exam: Physical Exam  Nursing note and vitals reviewed. Psychiatric: Her affect is labile. Her speech is rapid and/or pressured. She is hyperactive. Thought content is delusional. Cognition and memory are impaired. She expresses impulsivity.    Review of Systems  Neurological: Negative.   Psychiatric/Behavioral: The  patient is nervous/anxious.   All other systems reviewed and are negative.   Blood pressure 96/74, pulse 70, temperature 98 F (36.7 C), temperature source Oral, resp. rate 18, height 5\' 5"  (1.651 m), weight 66.2 kg (146 lb), SpO2 100 %.Body mass index is 24.3 kg/m.  General Appearance: Casual  Eye Contact:  Good  Speech:  Clear and Coherent  Volume:  Normal  Mood:  Anxious  Affect:  Inappropriate and Labile  Thought Process:  Goal Directed and Descriptions of Associations: Intact  Orientation:  Full (Time, Place, and Person)  Thought Content:  Illogical, Delusions, Obsessions and Rumination  Suicidal Thoughts:  No  Homicidal Thoughts:  No  Memory:  Immediate;   Fair Recent;   Fair Remote;   Fair  Judgement:  Poor  Insight:  Lacking  Psychomotor Activity:  Increased  Concentration:  Concentration: Fair and Attention Span: Fair  Recall:  Jennelle HumanFair  Fund of Knowledge:  Fair  Language:  Fair  Akathisia:  No  Handed:  Right  AIMS (if indicated):     Assets:  Communication Skills Desire for Improvement Housing Physical Health Resilience Social Support  ADL's:  Intact  Cognition:  WNL  Sleep:  Number of Hours: 7     Treatment Plan Summary: Daily contact with patient to assess and evaluate symptoms and progress in treatment and Medication management   [name] is a 47 year old female with a history of bipolar disorder admitted for psychotic break.  #Suicidal ideation, resolved -patient able to contract for safety  #Mood and psychosis -discontinue Zyprexaand Invega -discontinuePropranolol, low BP -continueDepakote 500 mgBID -continue Restoril15 mg nightly -consider ECT if no improvement  #OCD -continue Luvox200 mg nightly -continueLibrium 5 mgTID  #UTI -UA and UCx -fosfomycin 3 g once  #Hypothyroidism -continueSynthroid 100 mcg  #HTN, BP low -discontinue Cozaar   #Dyslipidemia -Lipitor 10 mg daily  #Metabolic syndrome monitoring -Lipid  panel, TSH, HgbA1C are unremarkable -EKG,QTc 470 -pregnancy test is negative  #Disposition -discharge with the mother [facility]     Kristine LineaJolanta Miosotis Wetsel, MD 07/19/2017, 2:20 PM

## 2017-07-19 NOTE — Progress Notes (Signed)
Patient ID: Jeanne Hendricks, female   DOB: [birth year], 47 y.o.   MRN: [id number] Still restless at the start of the shift, and almost force her self into the nurses station, persistently knocking on the window and the door; patient was the first patient we (Ms Crisoforo OxfordKyei, Charlotte, RN & I) saw during our first round. "What's the name of the medication that you gave me for sleep last night, I slept good..." I wrote "Restoril" on a piece of paper and asked her to write again below mine. She repeated the name couple of times and folded the paper and put it away her left side breat pocket. "I am a pain in the b--t? I am sorry if I am a pain in the A--" she remarked.  Depakote 500 mg was given; it was 1951 hours. Redirected to the day room.  (although patient is needy and frequently and persistently needy, all complaints are clinically evaluated and assessed for clinical symptoms...)  2050: Patient came to the nurses' station with the right scrub pant above her knee to reveal an acute rash about a quarter of a Football, red, warm to touch in bilateral popliteal fossa, MAR reviewed for new medications that may cause rashes and Sinequan, now discontinued, was last given on 07/17/17 @ 2113 may be the causative.  Patient's  change in condition discussed with Dr. Demetrius CharityP; Benadryl 50 mg Capsule given as ordered.

## 2017-07-19 NOTE — Plan of Care (Signed)
  Progressing Spiritual Needs Ability to function at adequate level 07/19/2017 0939 - Progressing by Elige Radonobb, Gaylyn Berish B, RN Note Patient is visible in the milieu although intrusive and needy.   Activity: Interest or engagement in leisure activities will improve 07/19/2017 0939 - Progressing by Elige Radonobb, Remmi Armenteros B, RN Education: Knowledge of the prescribed therapeutic regimen will improve 07/19/2017 0939 - Progressing by Elige Radonobb, Bennet Kujawa B, RN Note Knows what medication she takes.  Coping: Ability to verbalize feelings will improve 07/19/2017 0939 - Progressing by Elige Radonobb, Berdia Lachman B, RN Health Behavior/Discharge Planning: Ability to make decisions will improve 07/19/2017 0939 - Progressing by Elige Radonobb, Bellarae Lizer B, RN Compliance with therapeutic regimen will improve 07/19/2017 0939 - Progressing by Elige Radonobb, Syreeta Figler B, RN Note Medication compliant Safety: Ability to disclose and discuss suicidal ideas will improve 07/19/2017 0939 - Progressing by Elige Radonobb, Ceira Hoeschen B, RN Note Denies SI Ability to identify and utilize support systems that promote safety will improve 07/19/2017 0939 - Progressing by Elige Radonobb, Fabiana Dromgoole B, RN Self-Concept: Ability to verbalize positive feelings about self will improve 07/19/2017 0939 - Progressing by Elige Radonobb, Tegh Franek B, RN [facility] Participation in Recreation Therapeutic Interventions ST[facility]-Other Recreation Therapy Goal (Specify) Description Patient will identify 3 positive coping skills to use post discharge x5 days.  07/19/2017 0939 - Progressing by Elige Radonobb, Clancey Welton B, RN Activity: Sleeping patterns will improve 07/19/2017 0939 - Progressing by Elige Radonobb, Jhayla Podgorski B, RN   Not Progressing Education: Utilization of techniques to improve thought processes will improve 07/19/2017 0939 - Not Progressing by Elige Radonobb, Annayah Worthley B, RN Note Patient is intrusive and needy.  Difficulty concentrating. Short attention span.  Does not like to listen.   Coping: Ability to cope will improve 07/19/2017 0939 - Not  Progressing by Elige Radonobb, Ahkeem Goede B, RN Note Relies on anxiety medications fir anxiety.  Role Relationship: Ability to demonstrate positive changes in social behaviors and relationships will improve 07/19/2017 0939 - Not Progressing by Elige Radonobb, Schyler Butikofer B, RN Note Patient continues to be intrusive.

## 2017-07-19 NOTE — [facility] Group Notes (Signed)
  07/19/2017  Time: 1:00PM  Type of Therapy/Topic:  Group Therapy:  Emotion Regulation  Participation Level:  Did Not Attend   Description of Group:    The purpose of this group is to assist patients in learning to regulate negative emotions and experience positive emotions. Patients will be guided to discuss ways in which they have been vulnerable to their negative emotions. These vulnerabilities will be juxtaposed with experiences of positive emotions or situations, and patients will be challenged to use positive emotions to combat negative ones. Special emphasis will be placed on coping with negative emotions in conflict situations, and patients will process healthy conflict resolution skills.  Therapeutic Goals: 1. Patient will identify two positive emotions or experiences to reflect on in order to balance out negative emotions 2. Patient will label two or more emotions that they find the most difficult to experience 3. Patient will demonstrate positive conflict resolution skills through discussion and/or role plays  Summary of Patient Progress: Pt was invited to attend group but chose not to attend. CSW will continue to encourage pt to attend group throughout their admission.    Therapeutic Modalities:   Cognitive Behavioral Therapy Feelings Identification Dialectical Behavioral Therapy   Heidi DachKelsey Quintavious Rinck, MSW, LCSW 07/19/2017 2:11 PM

## 2017-07-19 NOTE — Consult Note (Signed)
[facility] Face-to-Face Psychiatry Consult   Reason for Consult: Consult for ECT for this 46 year old woman with a history of schizoaffective disorder Referring Physician: Pucilowska Patient Identification: Jeanne Hendricks MRN:  [id number] Principal Diagnosis: Schizoaffective disorder, bipolar type [facility]) Diagnosis:   Patient Active Problem List   Diagnosis Date Noted  . OCD (obsessive compulsive disorder) [F42.9] 07/06/2017  . Schizoaffective disorder, bipolar type (HCC) [F25.0] 07/04/2017  . HTN (hypertension) [I10] 07/04/2017  . Diabetes (HCC) [E11.9] 07/04/2017  . Hypothyroidism [E03.9] 07/04/2017  . Suicidal ideation [R45.851] 07/04/2017    Total Time spent with patient: 1 hour  Subjective:   Jeanne Hendricks is a 47 y.o. female patient admitted with "I have bipolar disorder".  HPI: Patient seen for consultation for ECT.  Chart reviewed.  Spoke with nursing and primary psychiatrist.  Reviewed old notes.  This was actually my second meeting with this patient.  See intake note for more details but essentially this is a 47 year old woman with a history of chronic mental illness who apparently had had a decompensation over the last few months.  She only recently relocated to [facility]  She was brought to the hospital with confusion and agitation.  For most of this hospital stay she has been hyperactive, intrusive, very disorganized in her thinking and behavior and not showing consistent response to medication.  Treatment team brought up the idea of electroconvulsive therapy as a treatment for what appears to be a manic like part of a schizoaffective bipolar condition.  Social history: Most recently patient had relocated to [place] after previously living in [place].  Her mother appears to be her closest relative at this point.  Medical history: History of hypertension diabetes hypothyroidism.  No acute physical complaints right now.  Substance abuse history: Denies any significant  substance abuse problems  Past Psychiatric History: Patient has a history of schizoaffective or bipolar disorder and has had previous psychiatric hospitalizations.  Also has been diagnosed with OCD.  Full course of her past illness has been difficult to ascertain because of the patient's poor history.  Apparently no past history of ECT.  Patient has been treated with multiple medications including mood stabilizers and antipsychotics since being in the hospital with minimal or no improvement.  Risk to Self: Is patient at risk for suicide?: No(Per pt. not at risk now) What has been your use of drugs/alcohol within the last 12 months?: None Risk to Others:   Prior Inpatient Therapy:   Prior Outpatient Therapy:    Past Medical History:  Past Medical History:  Diagnosis Date  . Depression   . Diabetes mellitus without complication (HCC)   . Hypertension   . Thyroid disease    History reviewed. No pertinent surgical history. Family History: History reviewed. No pertinent family history. Family Psychiatric  History: Unknown Social History:  Social History   Substance and Sexual Activity  Alcohol Use No  . Frequency: Never     Social History   Substance and Sexual Activity  Drug Use No    Social History   Socioeconomic History  . Marital status: Single    Spouse name: None  . Number of children: None  . Years of education: None  . Highest education level: None  Social Needs  . Financial resource strain: None  . Food insecurity - worry: None  . Food insecurity - inability: None  . Transportation needs - medical: None  . Transportation needs - non-medical: None  Occupational History  . None  Tobacco  Use  . Smoking status: Former Smoker  . Smokeless tobacco: Former Engineer, waterUser  Substance and Sexual Activity  . Alcohol use: No    Frequency: Never  . Drug use: No  . Sexual activity: None  Other Topics Concern  . None  Social History Narrative  . None   Additional Social  History:    Allergies:   Allergies  Allergen Reactions  . Bactrim [Sulfamethoxazole-Trimethoprim]   . Compazine [Prochlorperazine Edisylate]     Labs: No results found for this or any previous visit (from the past 48 hour(s)).  Current Facility-Administered Medications  Medication Dose Route Frequency Provider Last Rate Last Dose  . acetaminophen (TYLENOL) tablet 650 mg  650 mg Oral Q6H PRN Pucilowska, Jolanta B, MD   650 mg at 07/12/17 0832  . alum & mag hydroxide-simeth (MAALOX/MYLANTA) 200-200-20 MG/5ML suspension 30 mL  30 mL Oral Q4H PRN Pucilowska, Jolanta B, MD      . atorvastatin (LIPITOR) tablet 10 mg  10 mg Oral q1800 Pucilowska, Jolanta B, MD   10 mg at 07/18/17 1707  . chlordiazePOXIDE (LIBRIUM) capsule 5 mg  5 mg Oral TID Pucilowska, Jolanta B, MD   5 mg at 07/19/17 1153  . diphenhydrAMINE (BENADRYL) capsule 25 mg  25 mg Oral Q6H PRN Pucilowska, Jolanta B, MD      . divalproex (DEPAKOTE) DR tablet 500 mg  500 mg Oral Q12H Pucilowska, Jolanta B, MD   500 mg at 07/19/17 0858  . feeding supplement (ENSURE ENLIVE) (ENSURE ENLIVE) liquid 237 mL  237 mL Oral TID BM Pucilowska, Jolanta B, MD   237 mL at 07/19/17 1155  . fluvoxaMINE (LUVOX) tablet 200 mg  200 mg Oral QHS Pucilowska, Jolanta B, MD   200 mg at 07/18/17 2102  . levothyroxine (SYNTHROID, LEVOTHROID) tablet 100 mcg  100 mcg Oral QAC breakfast Pucilowska, Jolanta B, MD   100 mcg at 07/19/17 0858  . magnesium hydroxide (MILK OF MAGNESIA) suspension 30 mL  30 mL Oral Daily PRN Pucilowska, Jolanta B, MD   30 mL at 07/14/17 1719  . temazepam (RESTORIL) capsule 7.5 mg  7.5 mg Oral QHS Pucilowska, Jolanta B, MD        Musculoskeletal: Strength & Muscle Tone: within normal limits Gait & Station: normal Patient leans: N/A  Psychiatric Specialty Exam: Physical Exam  Nursing note and vitals reviewed. Constitutional: She appears well-developed and well-nourished.  HENT:  Head: Normocephalic and atraumatic.  Eyes:  Conjunctivae are normal. Pupils are equal, round, and reactive to light.  Neck: Normal range of motion.  Cardiovascular: Regular rhythm and normal heart sounds.  Respiratory: Effort normal. No respiratory distress.  GI: Soft. She exhibits no distension.  Musculoskeletal: Normal range of motion.  Neurological: She is alert.  Skin: Skin is warm and dry.  Psychiatric: Her mood appears anxious. Her speech is rapid and/or pressured. She is agitated. She is not aggressive, not hyperactive and not combative. Thought content is not paranoid. Cognition and memory are impaired. She expresses impulsivity. She expresses no homicidal and no suicidal ideation.    Review of Systems  Constitutional: Negative.   HENT: Negative.   Eyes: Negative.   Respiratory: Negative.   Cardiovascular: Negative.   Gastrointestinal: Negative.   Musculoskeletal: Negative.   Skin: Negative.   Neurological: Negative.   Psychiatric/Behavioral: Positive for memory loss. Negative for depression, hallucinations, substance abuse and suicidal ideas. The patient is nervous/anxious and has insomnia.     Blood pressure 96/74, pulse 70, temperature 98 F (36.7  C), temperature source Oral, resp. rate 18, height 5\' 5"  (1.651 m), weight 66.2 kg (146 lb), SpO2 100 %.Body mass index is 24.3 kg/m.  General Appearance: Casual  Eye Contact:  Good  Speech:  Pressured  Volume:  Decreased  Mood:  Euthymic  Affect:  Constricted  Thought Process:  Coherent  Orientation:  Full (Time, Place, and Person)  Thought Content:  Tangential  Suicidal Thoughts:  No  Homicidal Thoughts:  No  Memory:  Immediate;   Fair Recent;   Fair Remote;   Fair  Judgement:  Fair  Insight:  Fair  Psychomotor Activity:  Restlessness  Concentration:  Concentration: Fair  Recall:  [employer] of Knowledge:  Fair  Language:  Fair  Akathisia:  No  Handed:  Right  AIMS (if indicated):     Assets:  Desire for Improvement Housing Physical  Health Resilience Social Support  ADL's:  Intact  Cognition:  Impaired,  Mild  Sleep:  Number of Hours: 7     Treatment Plan Summary: Plan This is a 47 year old woman with a history of chronic mental illness who has been in the hospital for an extended stay at this point with symptoms of agitation mood instability and psychosis.  Patient had not been responding to medication.  ECT was considered by the treatment team.  While the patient's course has been somewhat atypical I think that ECT would be an appropriate modality.  She clearly has a history of a mood disorder and has been unstable to the point of it being impossible to discharge her from the hospital.  She has no significant contraindications to ECT.  On the other hand, the patient seems to be doing relatively well today.  She has calm down a little bit and is not as agitated as previously.  Nevertheless unless this is a dramatic permanent change I still think ECT would be appropriate to consider.  Patient was not willing to have a detailed discussion with me today.  She told me that she did not want to have the treatment.  She did this before I could even begin explaining the nature of the treatment.  I asked her if she could tell me what her concerns were but she would not go into it and said she simply wanted to go back to bed.  She did however agree that she would speak to me tomorrow again.  I will try to follow up with her tomorrow, Thursday.  I have already inform the ECT treatment team that the patient could be on the schedule by Friday if she agrees.  Case reviewed with primary psychiatrist.  Disposition: As noted, patient would be an appropriate candidate for ECT treatment based on psychotic mood disorder with severity and lack of response to appropriate medication  Mordecai Rasmussen, MD 07/19/2017 2:35 PM

## 2017-07-19 NOTE — Plan of Care (Signed)
Patient slept for Estimated Hours of 7; Precautionary checks every 15 minutes for safety maintained, room free of safety hazards, patient sustains no injury or falls during this shift.  

## 2017-07-19 NOTE — [facility] Group Notes (Signed)
[facility] Group Notes:  (Nursing/MHT/Case Management/Adjunct)  Date:  07/19/2017  Time:  5:10 AM  Type of Therapy:  Psychoeducational Skills  Participation Level:  Active  Participation Quality:  Appropriate and Attentive  Affect:  Appropriate  Cognitive:  Appropriate  Insight:  Appropriate  Engagement in Group:  Engaged  Modes of Intervention:  Discussion, Socialization and Support  Summary of Progress/Problems:  Chancy MilroyLaquanda Y Joselin Crandell 07/19/2017, 5:10 AM

## 2017-07-20 ENCOUNTER — Other Ambulatory Visit: Payer: Self-pay | Admitting: Psychiatry

## 2017-07-20 LAB — BASIC METABOLIC PANEL
Anion gap: 9 (ref 5–15)
BUN: 16 mg/dL (ref 6–20)
CALCIUM: 8.7 mg/dL — AB (ref 8.9–10.3)
CO2: 25 mmol/L (ref 22–32)
Chloride: 105 mmol/L (ref 101–111)
Creatinine, Ser: 0.79 mg/dL (ref 0.44–1.00)
GFR calc non Af Amer: >60 mL/min (ref >60)
Glucose, Bld: 155 mg/dL — ABNORMAL HIGH (ref 65–99)
Potassium: 3.5 mmol/L (ref 3.5–5.1)
Sodium: 139 mmol/L (ref 135–145)

## 2017-07-20 MED ORDER — METHYLPHENIDATE HCL 10 MG PO TABS
10.0000 mg | ORAL_TABLET | Freq: Once | ORAL | Status: AC
  Administered 2017-07-20: 10 mg via ORAL
  Filled 2017-07-20: qty 1

## 2017-07-20 NOTE — [facility] Group Notes (Signed)
[facility] Group Notes:  (Nursing/MHT/Case Management/Adjunct)  Date:  07/20/2017  Time:  10:45 PM  Type of Therapy:  Group Therapy  Participation Level:  Did Not Attend   Summary of Progress/Problems:  Mayra NeerJackie L Retta Pitcher 07/20/2017, 10:45 PM

## 2017-07-20 NOTE — Progress Notes (Signed)
Patient ID: Jeanne Hendricks, female   DOB: 1970/11/05, 47 y.o.   MRN: [id number] No changes in patient behavior, compliant with medications, Benadryl 25 mg given with bedtime medication.

## 2017-07-20 NOTE — [facility] Group Notes (Signed)
07/20/2017 1PM  Type of Therapy/Topic:  Group Therapy:  Balance in Life  Participation Level:  Did Not Attend  Description of Group:   This group will address the concept of balance and how it feels and looks when one is unbalanced. Patients will be encouraged to process areas in their lives that are out of balance and identify reasons for remaining unbalanced. Facilitators will guide patients in utilizing problem-solving interventions to address and correct the stressor making their life unbalanced. Understanding and applying boundaries will be explored and addressed for obtaining and maintaining a balanced life. Patients will be encouraged to explore ways to assertively make their unbalanced needs known to significant others in their lives, using other group members and facilitator for support and feedback.  Therapeutic Goals: 1. Patient will identify two or more emotions or situations they have that consume much of in their lives. 2. Patient will identify signs/triggers that life has become out of balance:  3. Patient will identify two ways to set boundaries in order to achieve balance in their lives:  4. Patient will demonstrate ability to communicate their needs through discussion and/or role plays  Summary of Patient Progress: Patient was encouraged and invited to attend group. Patient did not attend group. Social worker will continue to encourage group participation in the future.         Therapeutic Modalities:   Cognitive Behavioral Therapy Solution-Focused Therapy Assertiveness Training  Tarvis Blossom, LCSW  

## 2017-07-20 NOTE — Plan of Care (Signed)
Patient slept for Estimated Hours of 7.45; Precautionary checks every 15 minutes for safety maintained, room free of safety hazards, patient sustains no injury or falls during this shift.  

## 2017-07-20 NOTE — Plan of Care (Signed)
  Progressing Education: Knowledge of the prescribed therapeutic regimen will improve 07/20/2017 1106 - Progressing by Elige Radonobb, Buddy Loeffelholz B, RN Note Knowledgeable about medications. Coping: Ability to verbalize feelings will improve 07/20/2017 1106 - Progressing by Elige Radonobb, Julieta Rogalski B, RN Health Behavior/Discharge Planning: Compliance with therapeutic regimen will improve 07/20/2017 1106 - Progressing by Elige Radonobb, Simonne Boulos B, RN Note Medication compliant.   Safety: Ability to disclose and discuss suicidal ideas will improve 07/20/2017 1106 - Progressing by Elige Radonobb, Karston Hyland B, RN Note Deneis SI   Not Progressing Spiritual Needs Ability to function at adequate level 07/20/2017 1106 - Not Progressing by Elige Radonobb, Pierra Skora B, RN Note Impulsive and intrusive.  Has to be encouraged to maintain personal care chores.  Requires constant re-direction.   Activity: Interest or engagement in leisure activities will improve 07/20/2017 1106 - Not Progressing by Elige Radonobb, Kelia Gibbon B, RN Note No group attendance. Stays to self when in the milieu.   Education: Utilization of techniques to improve thought processes will improve 07/20/2017 1106 - Not Progressing by Elige Radonobb, Dion Sibal B, RN Coping: Ability to cope will improve 07/20/2017 1106 - Not Progressing by Elige Radonobb, Toan Mort B, RN Note Patient is needy and intrusive.  Frequently asks for anxiety medications.   Health Behavior/Discharge Planning: Ability to make decisions will improve 07/20/2017 1106 - Not Progressing by Elige Radonobb, Kasie Leccese B, RN Role Relationship: Ability to demonstrate positive changes in social behaviors and relationships will improve 07/20/2017 1106 - Not Progressing by Elige Radonobb, Khiem Gargis B, RN Note Continues to be intrusive even with re-direction and with limit setting.   Self-Concept: Ability to verbalize positive feelings about self will improve 07/20/2017 1106 - Not Progressing by Elige Radonobb, Aqueelah Cotrell B, RN

## 2017-07-20 NOTE — Consult Note (Signed)
[facility] Face-to-Face Psychiatry Consult   Reason for Consult: Follow-up consult for 47 year old woman with bipolar disorder who was recommended for ECT treatment Referring Physician: Pucilowska Patient Identification: Jeanne Hendricks MRN:  [id number] Principal Diagnosis: Schizoaffective disorder, bipolar type [facility]) Diagnosis:   Patient Active Problem List   Diagnosis Date Noted  . OCD (obsessive compulsive disorder) [F42.9] 07/06/2017  . Schizoaffective disorder, bipolar type (HCC) [F25.0] 07/04/2017  . HTN (hypertension) [I10] 07/04/2017  . Diabetes (HCC) [E11.9] 07/04/2017  . Hypothyroidism [E03.9] 07/04/2017  . Suicidal ideation [R45.851] 07/04/2017    Total Time spent with patient: 20 minutes  Subjective:   Jeanne Hendricks is a 47 y.o. female patient admitted with "I guess I will do it".  See previous notes.  47 year old woman with bipolar disorder who has been agitated disorganized confused mood E and difficult to communicate with.  Patient was recommended for ECT treatment.  The patient appears to be a good candidate based on mood disorder diagnosis extended hospitalization and failure to improve with current treatment.  Patient today is expressing agreement to the plan to begin ECT tomorrow.Marland Kitchen  HPI: See previous notes.  Long-standing bipolar disorder with current episode of confusion and agitation  Past Psychiatric History: Positive long-standing illness most likely bipolar with mixed symptoms  Risk to Self: Is patient at risk for suicide?: No(Per pt. not at risk now) What has been your use of drugs/alcohol within the last 12 months?: None Risk to Others:   Prior Inpatient Therapy:   Prior Outpatient Therapy:    Past Medical History:  Past Medical History:  Diagnosis Date  . Depression   . Diabetes mellitus without complication (HCC)   . Hypertension   . Thyroid disease    History reviewed. No pertinent surgical history. Family History: History reviewed. No pertinent family  history. Family Psychiatric  History: Unknown Social History:  Social History   Substance and Sexual Activity  Alcohol Use No  . Frequency: Never     Social History   Substance and Sexual Activity  Drug Use No    Social History   Socioeconomic History  . Marital status: Single    Spouse name: None  . Number of children: None  . Years of education: None  . Highest education level: None  Social Needs  . Financial resource strain: None  . Food insecurity - worry: None  . Food insecurity - inability: None  . Transportation needs - medical: None  . Transportation needs - non-medical: None  Occupational History  . None  Tobacco Use  . Smoking status: Former Games developer  . Smokeless tobacco: Former Engineer, water and Sexual Activity  . Alcohol use: No    Frequency: Never  . Drug use: No  . Sexual activity: None  Other Topics Concern  . None  Social History Narrative  . None   Additional Social History:    Allergies:   Allergies  Allergen Reactions  . Bactrim [Sulfamethoxazole-Trimethoprim]   . Compazine [Prochlorperazine Edisylate]     Labs: No results found for this or any previous visit (from the past 48 hour(s)).  Current Facility-Administered Medications  Medication Dose Route Frequency Provider Last Rate Last Dose  . acetaminophen (TYLENOL) tablet 650 mg  650 mg Oral Q6H PRN Pucilowska, Jolanta B, MD   650 mg at 07/12/17 0832  . alum & mag hydroxide-simeth (MAALOX/MYLANTA) 200-200-20 MG/5ML suspension 30 mL  30 mL Oral Q4H PRN Pucilowska, Jolanta B, MD      . atorvastatin (LIPITOR) tablet  10 mg  10 mg Oral q1800 Pucilowska, Jolanta B, MD   10 mg at 07/20/17 1720  . feeding supplement (ENSURE ENLIVE) (ENSURE ENLIVE) liquid 237 mL  237 mL Oral TID BM Pucilowska, Jolanta B, MD   237 mL at 07/20/17 1217  . fluvoxaMINE (LUVOX) tablet 200 mg  200 mg Oral QHS Pucilowska, Jolanta B, MD   200 mg at 07/19/17 2113  . levothyroxine (SYNTHROID, LEVOTHROID) tablet 100 mcg   100 mcg Oral QAC breakfast Pucilowska, Jolanta B, MD   100 mcg at 07/20/17 0814  . magnesium hydroxide (MILK OF MAGNESIA) suspension 30 mL  30 mL Oral Daily PRN Pucilowska, Jolanta B, MD   30 mL at 07/14/17 1719  . temazepam (RESTORIL) capsule 7.5 mg  7.5 mg Oral QHS Pucilowska, Jolanta B, MD   7.5 mg at 07/19/17 2113    Musculoskeletal: Strength & Muscle Tone: within normal limits Gait & Station: normal Patient leans: N/A  Psychiatric Specialty Exam: Physical Exam  Nursing note and vitals reviewed. Constitutional: She appears well-developed and well-nourished.  HENT:  Head: Normocephalic and atraumatic.  Eyes: Conjunctivae are normal. Pupils are equal, round, and reactive to light.  Neck: Normal range of motion.  Cardiovascular: Regular rhythm and normal heart sounds.  Respiratory: Effort normal. No respiratory distress.  GI: Soft.  Musculoskeletal: Normal range of motion.  Neurological: She is alert.  Skin: Skin is warm and dry.  Psychiatric: Her affect is blunt. Her speech is tangential. She is not agitated. Thought content is not paranoid. Cognition and memory are impaired. She expresses impulsivity. She expresses no homicidal and no suicidal ideation.    Review of Systems  Constitutional: Negative.   HENT: Negative.   Eyes: Negative.   Respiratory: Negative.   Cardiovascular: Negative.   Gastrointestinal: Negative.   Musculoskeletal: Negative.   Skin: Negative.   Neurological: Negative.   Psychiatric/Behavioral: Positive for depression and memory loss. Negative for hallucinations, substance abuse and suicidal ideas. The patient is nervous/anxious and has insomnia.     Blood pressure 119/83, pulse 76, temperature 98.6 F (37 C), resp. rate 18, height 5\' 5"  (1.651 m), weight 66.2 kg (146 lb), SpO2 100 %.Body mass index is 24.3 kg/m.  General Appearance: Casual  Eye Contact:  Fair  Speech:  Slow  Volume:  Decreased  Mood:  Anxious  Affect:  Constricted  Thought  Process:  Disorganized  Orientation:  Full (Time, Place, and Person)  Thought Content:  Illogical and Rumination  Suicidal Thoughts:  No  Homicidal Thoughts:  No  Memory:  Immediate;   Fair Recent;   Fair Remote;   Fair  Judgement:  Fair  Insight:  Fair  Psychomotor Activity:  Decreased  Concentration:  Concentration: Fair  Recall:  [employer] of Knowledge:  Fair  Language:  Fair  Akathisia:  No  Handed:  Right  AIMS (if indicated):     Assets:  Desire for Improvement Housing Physical Health  ADL's:  Impaired  Cognition:  Impaired,  Mild  Sleep:  Number of Hours: 7.45     Treatment Plan Summary: Plan 47 year old woman with mixed bipolar disorder who is agreeable to beginning ECT treatment.  Recommend starting ECT tomorrow with right unilateral treatment.  Orders completed.  Patient is agreeable to the treatment plan.  I have ordered a repeat of her basic metabolic because the previous one had a low potassium.  Also ordered a chest x-ray.  EKG looks fine other labs look fine.  Patient will be taken  off of her Depakote for the time being.  I will take over treatment beginning with ECT tomorrow.  Disposition: Supportive therapy provided about ongoing stressors. See note above about treatment plan  Mordecai RasmussenJohn Mihaela Fajardo, MD 07/20/2017 5:51 PM

## 2017-07-20 NOTE — Progress Notes (Signed)
[facility] MD Progress Note  07/20/2017 12:56 PM Jeanne Hendricks  MRN:  [id number]  Subjective:   Jeanne Hendricks is still very intrusive, perseverating, unable to have a normal conversation. She remembers talking to Dr. Toni Amend about ECT and seems agreeable when she is able to focus on problem at hand. She has been asking for Benadryl repeatedly evn though her rash and itching are gone.   Treatment plan. We continue Depakote, Luvox and Restoril for sleep and low dose Librium for severe anxiety. I am going to give her a single dose of Ritalin today. There is a history of severe ADHD in the family.  Social/disposition. She will be discharged to home. She has no income or health insurance. Follow up with [facility].  Principal Problem: Schizoaffective disorder, bipolar type (HCC) Diagnosis:   Patient Active Problem List   Diagnosis Date Noted  . Schizoaffective disorder, bipolar type (HCC) [F25.0] 07/04/2017    Priority: High  . OCD (obsessive compulsive disorder) [F42.9] 07/06/2017  . HTN (hypertension) [I10] 07/04/2017  . Diabetes (HCC) [E11.9] 07/04/2017  . Hypothyroidism [E03.9] 07/04/2017  . Suicidal ideation [R45.851] 07/04/2017   Total Time spent with patient: 20 minutes  Past Psychiatric History: bipolar disorder  Past Medical History:  Past Medical History:  Diagnosis Date  . Depression   . Diabetes mellitus without complication (HCC)   . Hypertension   . Thyroid disease    History reviewed. No pertinent surgical history. Family History: History reviewed. No pertinent family history. Family Psychiatric  History: bipolar, schizophrenia, ADHD Social History:  Social History   Substance and Sexual Activity  Alcohol Use No  . Frequency: Never     Social History   Substance and Sexual Activity  Drug Use No    Social History   Socioeconomic History  . Marital status: Single    Spouse name: None  . Number of children: None  . Years of education: None  . Highest education level:  None  Social Needs  . Financial resource strain: None  . Food insecurity - worry: None  . Food insecurity - inability: None  . Transportation needs - medical: None  . Transportation needs - non-medical: None  Occupational History  . None  Tobacco Use  . Smoking status: Former Games developer  . Smokeless tobacco: Former Engineer, water and Sexual Activity  . Alcohol use: No    Frequency: Never  . Drug use: No  . Sexual activity: None  Other Topics Concern  . None  Social History Narrative  . None   Additional Social History:                         Sleep: Fair  Appetite:  Fair  Current Medications: Current Facility-Administered Medications  Medication Dose Route Frequency Provider Last Rate Last Dose  . acetaminophen (TYLENOL) tablet 650 mg  650 mg Oral Q6H PRN Malissie Musgrave B, MD   650 mg at 07/12/17 0832  . alum & mag hydroxide-simeth (MAALOX/MYLANTA) 200-200-20 MG/5ML suspension 30 mL  30 mL Oral Q4H PRN Amel Gianino B, MD      . atorvastatin (LIPITOR) tablet 10 mg  10 mg Oral q1800 Aryia Delira B, MD   10 mg at 07/19/17 1706  . divalproex (DEPAKOTE) DR tablet 500 mg  500 mg Oral Q12H Vane Yapp B, MD   500 mg at 07/20/17 0814  . feeding supplement (ENSURE ENLIVE) (ENSURE ENLIVE) liquid 237 mL  237 mL Oral TID BM Mohanad Carsten,  Orphia Mctigue B, MD   237 mL at 07/20/17 1217  . fluvoxaMINE (LUVOX) tablet 200 mg  200 mg Oral QHS Gyan Cambre B, MD   200 mg at 07/19/17 2113  . levothyroxine (SYNTHROID, LEVOTHROID) tablet 100 mcg  100 mcg Oral QAC breakfast Arvin Abello B, MD   100 mcg at 07/20/17 0814  . magnesium hydroxide (MILK OF MAGNESIA) suspension 30 mL  30 mL Oral Daily PRN Callia Swim B, MD   30 mL at 07/14/17 1719  . temazepam (RESTORIL) capsule 7.5 mg  7.5 mg Oral QHS Kionte Baumgardner B, MD   7.5 mg at 07/19/17 2113    Lab Results: No results found for this or any previous visit (from the past 48 hour(s)).  Blood  Alcohol level:  Lab Results  Component Value Date   ETH <10 07/03/2017    Metabolic Disorder Labs: Lab Results  Component Value Date   HGBA1C 5.1 07/06/2017   MPG 99.67 07/06/2017   No results found for: PROLACTIN Lab Results  Component Value Date   CHOL 103 07/06/2017   TRIG 112 07/06/2017   HDL 39 (L) 07/06/2017   CHOLHDL 2.6 07/06/2017   VLDL 22 07/06/2017   LDLCALC 42 07/06/2017    Physical Findings: AIMS: Facial and Oral Movements Muscles of Facial Expression: None, normal Lips and Perioral Area: None, normal Jaw: None, normal Tongue: None, normal,Extremity Movements Upper (arms, wrists, hands, fingers): None, normal Lower (legs, knees, ankles, toes): None, normal, Trunk Movements Neck, shoulders, hips: None, normal, Overall Severity Severity of abnormal movements (highest score from questions above): None, normal Incapacitation due to abnormal movements: None, normal Patient's awareness of abnormal movements (rate only patient's report): No Awareness, Dental Status Current problems with teeth and/or dentures?: No Does patient usually wear dentures?: No  CIWA:    COWS:     Musculoskeletal: Strength & Muscle Tone: within normal limits Gait & Station: normal Patient leans: N/A  Psychiatric Specialty Exam: Physical Exam  Nursing note and vitals reviewed. Psychiatric: Her mood appears anxious. Her affect is inappropriate. Her speech is rapid and/or pressured. She is hyperactive. Thought content is delusional. Cognition and memory are impaired. She expresses impulsivity.    Review of Systems  Neurological: Negative.   Psychiatric/Behavioral: The patient is nervous/anxious.   All other systems reviewed and are negative.   Blood pressure 119/83, pulse 76, temperature 98.6 F (37 C), resp. rate 18, height 5\' 5"  (1.651 m), weight 66.2 kg (146 lb), SpO2 100 %.Body mass index is 24.3 kg/m.  General Appearance: Casual  Eye Contact:  Good  Speech:  Pressured   Volume:  Normal  Mood:  Anxious  Affect:  Inappropriate  Thought Process:  Linear  Orientation:  Full (Time, Place, and Person)  Thought Content:  Delusions  Suicidal Thoughts:  No  Homicidal Thoughts:  No  Memory:  Immediate;   Fair Recent;   Fair Remote;   Fair  Judgement:  Poor  Insight:  Lacking  Psychomotor Activity:  Increased  Concentration:  Concentration: Fair and Attention Span: Fair  Recall:  [employer]Fair  Fund of Knowledge:  Fair  Language:  Fair  Akathisia:  No  Handed:  Right  AIMS (if indicated):     Assets:  Communication Skills Desire for Improvement Housing Physical Health Resilience Social Support  ADL's:  Intact  Cognition:  WNL  Sleep:  Number of Hours: 7.45     Treatment Plan Summary: Daily contact with patient to assess and evaluate symptoms and progress in treatment  and Medication management   Jeanne Hendricks is a 47 year old female with a history of bipolar disorder admitted for psychotic break.  #Suicidal ideation, resolved -patient able to contract for safety  #Mood and psychosis -discontinue Zyprexaand Invega -continueDepakote 500 mgBID -continue Restoril15 mgnightly -consider ECT if no improvement -one dose of Ritalin 10 mg today  #OCD -continue Luvox200 mg nightly -continueLibrium 5 mgTID  #UTI -UA and UCx -fosfomycin 3 g once  #Hypothyroidism -continueSynthroid 100 mcg  #HTN, BP low -discontinueCozaar   #Dyslipidemia -Lipitor 10 mg daily  #Metabolic syndrome monitoring -Lipid panel, TSH, HgbA1C are unremarkable -EKG,QTc 470 -pregnancy test is negative  #Disposition -discharge with the mother -follow up with [facility]    Kristine Linea, MD 07/20/2017, 12:56 PM

## 2017-07-20 NOTE — BHH Group Notes (Signed)
LCSW Group Therapy Note 07/20/2017 9:00 AM  Type of Therapy and Topic:  Group Therapy:  Setting Goals  Participation Level:  Did Not Attend  Description of Group: In this process group, patients discussed using strengths to work toward goals and address challenges.  Patients identified two positive things about themselves and one goal they were working on.  Patients were given the opportunity to share openly and support each other's plan for self-empowerment.  The group discussed the value of gratitude and were encouraged to have a daily reflection of positive characteristics or circumstances.  Patients were encouraged to identify a plan to utilize their strengths to work on current challenges and goals.  Therapeutic Goals 1. Patient will verbalize personal strengths/positive qualities and relate how these can assist with achieving desired personal goals 2. Patients will verbalize affirmation of peers plans for personal change and goal setting 3. Patients will explore the value of gratitude and positive focus as related to successful achievement of goals 4. Patients will verbalize a plan for regular reinforcement of personal positive qualities and circumstances.  Summary of Patient Progress:       Therapeutic Modalities Cognitive Behavioral Therapy Motivational Interviewing    Alease FrameSonya S Willmer Fellers, LCSW 07/20/2017 11:50 AM

## 2017-07-21 ENCOUNTER — Inpatient Hospital Stay: Payer: Medicaid Other | Admitting: Anesthesiology

## 2017-07-21 ENCOUNTER — Encounter: Payer: Self-pay | Admitting: Anesthesiology

## 2017-07-21 ENCOUNTER — Inpatient Hospital Stay: Payer: Medicaid Other

## 2017-07-21 LAB — GLUCOSE, CAPILLARY
GLUCOSE-CAPILLARY: 97 mg/dL (ref 65–99)
Glucose-Capillary: 82 mg/dL (ref 65–99)

## 2017-07-21 MED ORDER — METHOHEXITAL SODIUM 100 MG/10ML IV SOSY
PREFILLED_SYRINGE | INTRAVENOUS | Status: DC | PRN
  Administered 2017-07-21: 80 mg via INTRAVENOUS

## 2017-07-21 MED ORDER — SODIUM CHLORIDE 0.9 % IV SOLN
500.0000 mL | Freq: Once | INTRAVENOUS | Status: AC
  Administered 2017-07-21: 500 mL via INTRAVENOUS

## 2017-07-21 MED ORDER — MIDAZOLAM HCL 2 MG/2ML IJ SOLN: 2.0000 mg | Freq: Once | INTRAMUSCULAR | Status: DC

## 2017-07-21 MED ORDER — ONDANSETRON HCL 4 MG/2ML IJ SOLN: 4.0000 mg | Freq: Once | INTRAMUSCULAR | Status: DC | PRN

## 2017-07-21 MED ORDER — KETOROLAC TROMETHAMINE 30 MG/ML IJ SOLN
INTRAMUSCULAR | Status: AC
  Filled 2017-07-21: qty 1

## 2017-07-21 MED ORDER — SODIUM CHLORIDE 0.9 % IV SOLN
INTRAVENOUS | Status: DC | PRN
  Administered 2017-07-21: 12:00:00 via INTRAVENOUS

## 2017-07-21 MED ORDER — FENTANYL CITRATE (PF) 100 MCG/2ML IJ SOLN: 25.0000 ug | INTRAMUSCULAR | Status: DC | PRN

## 2017-07-21 MED ORDER — MIDAZOLAM HCL 2 MG/2ML IJ SOLN
INTRAMUSCULAR | Status: AC
  Filled 2017-07-21: qty 2

## 2017-07-21 MED ORDER — SUCCINYLCHOLINE CHLORIDE 20 MG/ML IJ SOLN
INTRAMUSCULAR | Status: DC | PRN
  Administered 2017-07-21: 80 mg via INTRAVENOUS

## 2017-07-21 MED ORDER — HALOPERIDOL LACTATE 5 MG/ML IJ SOLN
5.0000 mg | Freq: Once | INTRAMUSCULAR | Status: AC
  Administered 2017-07-21: 5 mg via INTRAVENOUS

## 2017-07-21 MED ORDER — MIDAZOLAM HCL 2 MG/2ML IJ SOLN
2.0000 mg | Freq: Once | INTRAMUSCULAR | Status: AC
  Administered 2017-07-21: 2 mg via INTRAVENOUS

## 2017-07-21 MED ORDER — KETOROLAC TROMETHAMINE 30 MG/ML IJ SOLN
30.0000 mg | Freq: Once | INTRAMUSCULAR | Status: AC
  Administered 2017-07-21: 30 mg via INTRAVENOUS

## 2017-07-21 NOTE — Anesthesia Postprocedure Evaluation (Signed)
Anesthesia Post Note  Patient: Jeanne Hendricks  Procedure(s) Performed: ECT TX  Patient location during evaluation: PACU Anesthesia Type: General Level of consciousness: awake and alert Pain management: pain level controlled Vital Signs Assessment: post-procedure vital signs reviewed and stable Respiratory status: spontaneous breathing, nonlabored ventilation, respiratory function stable and patient connected to nasal cannula oxygen Cardiovascular status: blood pressure returned to baseline and stable Postop Assessment: no apparent nausea or vomiting Anesthetic complications: no     Last Vitals:  Vitals:   07/21/17 1232 07/21/17 1239  BP: (!) 116/102 102/84  Pulse: 93 (!) 137  Resp: (!) 27 17  Temp:  37.3 C  SpO2: 100% 91%    Last Pain:  Vitals:   07/21/17 0941  TempSrc: Oral  PainSc:                  [name] S

## 2017-07-21 NOTE — Progress Notes (Signed)
Patient ID: Jeanne Hendricks, female   DOB: 26-Dec-1970, 47 y.o.   MRN: [id number] CSW received a call from pt's mother, [name].  [name] shared that she had went to the [employer] office and she was told that CSW should be able to assist pt and her with completing the paperwork to apply for disability benefits.  CSW informed [name] that she would be happy to make a referral to [facility] in [place], [place] who have SOAR counselors that can assist her with applying for benefits.  CSW informed [name] that she would start completing the referral application, but she would have to complete the missing sections.  CSW also informed [name] that she would give her some information about how to apply for disability benefits that included all the documentation that will be needed as a part of the application process.    [name] informed CSW that she would complete the referral form and have pt sign consent when she comes to visit pt tonight.   CSW put [facility] referral form, consent for [facility], and information about how to apply for Social Security disability benefits in a folder and placed it on a shelf in pt's room.  CSW informed gave pt information about the folder. CSW checked in with pt, who had her first ECT treatment today, to see how she was doing.  Pt had stopped CSW and informed her that she was very nervous about getting the procedure.  Pt was lying in her bed and stated that she was feeling fine.  She informed CSW that she was really to go home.

## 2017-07-21 NOTE — Anesthesia Procedure Notes (Signed)
Procedure Name: General with mask airway Date/Time: 07/21/2017 12:02 PM Performed by: Ginger CarneMichelet, Rollins Wrightson, CRNA Pre-anesthesia Checklist: Patient identified, Timeout performed, Emergency Drugs available, Suction available and Patient being monitored Patient Re-evaluated:Patient Re-evaluated prior to induction Oxygen Delivery Method: Circle system utilized Preoxygenation: Pre-oxygenation with 100% oxygen Induction Type: IV induction Ventilation: Mask ventilation without difficulty

## 2017-07-21 NOTE — Progress Notes (Signed)
[facility] MD Progress Note  07/21/2017 8:19 PM Jeanne Hendricks  MRN:  [id number] Subjective: Follow-up inpatient note for 47 year old woman with bipolar disorder.  Patient continues to be very agitated and hyperactive.  Very intrusive.  Cannot sit still for more than a few seconds.  Constantly approaching nurses with questions that have already been answered.  Patient had ECT this morning with right unilateral treatment being done.  No complication other than some confusion during the recovery period.  This evening the patient is awake and alert.  I spoke with her and also got to speak with her mother.  Mother reports that the patient seems to be improving.  Nevertheless the patient is still confused and hyperactive.  We have held off on all medicines right now while doing ECT.  Vitals currently stable.  No new physical complaints Principal Problem: Schizoaffective disorder, bipolar type ([place]) Diagnosis:   Patient Active Problem List   Diagnosis Date Noted  . OCD (obsessive compulsive disorder) [F42.9] 07/06/2017  . Schizoaffective disorder, bipolar type ([place]) [F25.0] 07/04/2017  . HTN (hypertension) [I10] 07/04/2017  . Diabetes ([place]) [E11.9] 07/04/2017  . Hypothyroidism [E03.9] 07/04/2017  . Suicidal ideation [R45.851] 07/04/2017   Total Time spent with patient: 30 minutes  Past Psychiatric History: History of bipolar disorder  Past Medical History:  Past Medical History:  Diagnosis Date  . Depression   . Diabetes mellitus without complication ([place])   . Hypertension   . Thyroid disease    History reviewed. No pertinent surgical history. Family History: History reviewed. No pertinent family history. Family Psychiatric  History: No known Social History:  Social History   Substance and Sexual Activity  Alcohol Use No  . Frequency: Never     Social History   Substance and Sexual Activity  Drug Use No    Social History   Socioeconomic History  . Marital status: Single    Spouse  name: None  . Number of children: None  . Years of education: None  . Highest education level: None  Social Needs  . Financial resource strain: None  . Food insecurity - worry: None  . Food insecurity - inability: None  . Transportation needs - medical: None  . Transportation needs - non-medical: None  Occupational History  . None  Tobacco Use  . Smoking status: Former Research scientist (life sciences)  . Smokeless tobacco: Former Network engineer and Sexual Activity  . Alcohol use: No    Frequency: Never  . Drug use: No  . Sexual activity: None  Other Topics Concern  . None  Social History Narrative  . None   Additional Social History:                         Sleep: Fair  Appetite:  Fair  Current Medications: Current Facility-Administered Medications  Medication Dose Route Frequency Provider Last Rate Last Dose  . acetaminophen (TYLENOL) tablet 650 mg  650 mg Oral Q6H PRN Pucilowska, Jolanta B, MD   650 mg at 07/12/17 0832  . alum & mag hydroxide-simeth (MAALOX/MYLANTA) 200-200-20 MG/5ML suspension 30 mL  30 mL Oral Q4H PRN Pucilowska, Jolanta B, MD      . atorvastatin (LIPITOR) tablet 10 mg  10 mg Oral q1800 Pucilowska, Jolanta B, MD   10 mg at 07/21/17 1812  . feeding supplement (ENSURE ENLIVE) (ENSURE ENLIVE) liquid 237 mL  237 mL Oral TID BM Pucilowska, Jolanta B, MD   237 mL at 07/21/17 1814  . fluvoxaMINE (  LUVOX) tablet 200 mg  200 mg Oral QHS Pucilowska, Jolanta B, MD   200 mg at 07/20/17 2105  . ketorolac (TORADOL) 30 MG/ML injection           . levothyroxine (SYNTHROID, LEVOTHROID) tablet 100 mcg  100 mcg Oral QAC breakfast Pucilowska, Jolanta B, MD   100 mcg at 07/21/17 1309  . magnesium hydroxide (MILK OF MAGNESIA) suspension 30 mL  30 mL Oral Daily PRN Pucilowska, Jolanta B, MD   30 mL at 07/14/17 1719  . midazolam (VERSED) injection 2 mg  2 mg Intravenous Once Alister Staver T, MD      . ondansetron [facility]) injection 4 mg  4 mg Intravenous Once PRN Gunnar Bulla, MD      .  temazepam (RESTORIL) capsule 7.5 mg  7.5 mg Oral QHS Pucilowska, Jolanta B, MD   7.5 mg at 07/20/17 2105    Lab Results:  Results for orders placed or performed during the hospital encounter of 07/05/17 (from the past 48 hour(s))  Basic metabolic panel     Status: Abnormal   Collection Time: 07/20/17  6:42 PM  Result Value Ref Range   Sodium 139 135 - 145 mmol/L   Potassium 3.5 3.5 - 5.1 mmol/L   Chloride 105 101 - 111 mmol/L   CO2 25 22 - 32 mmol/L   Glucose, Bld 155 (H) 65 - 99 mg/dL   BUN 16 6 - 20 mg/dL   Creatinine, Ser 0.79 0.44 - 1.00 mg/dL   Calcium 8.7 (L) 8.9 - 10.3 mg/dL   GFR calc non Af Amer >60 >60 mL/min   GFR calc Af Amer >60 >60 mL/min    Comment: (NOTE) The eGFR has been calculated using the CKD EPI equation. This calculation has not been validated in all clinical situations. eGFR's persistently <60 mL/min signify possible Chronic Kidney Disease.    Anion gap 9 5 - 15    Comment: Performed at [facility], [address] [place], [place] [address]  Glucose, capillary     Status: None   Collection Time: 07/21/17  5:44 AM  Result Value Ref Range   Glucose-Capillary 82 65 - 99 mg/dL  Glucose, capillary     Status: None   Collection Time: 07/21/17 12:21 PM  Result Value Ref Range   Glucose-Capillary 97 65 - 99 mg/dL    Blood Alcohol level:  Lab Results  Component Value Date   ETH <10 59/56/3875    Metabolic Disorder Labs: Lab Results  Component Value Date   HGBA1C 5.1 07/06/2017   MPG 99.67 07/06/2017   No results found for: PROLACTIN Lab Results  Component Value Date   CHOL 103 07/06/2017   TRIG 112 07/06/2017   HDL 39 (L) 07/06/2017   CHOLHDL 2.6 07/06/2017   VLDL 22 07/06/2017   LDLCALC 42 07/06/2017    Physical Findings: AIMS: Facial and Oral Movements Muscles of Facial Expression: None, normal Lips and Perioral Area: None, normal Jaw: None, normal Tongue: None, normal,Extremity Movements Upper (arms, wrists, hands,  fingers): None, normal Lower (legs, knees, ankles, toes): None, normal, Trunk Movements Neck, shoulders, hips: None, normal, Overall Severity Severity of abnormal movements (highest score from questions above): None, normal Incapacitation due to abnormal movements: None, normal Patient's awareness of abnormal movements (rate only patient's report): No Awareness, Dental Status Current problems with teeth and/or dentures?: No Does patient usually wear dentures?: No  CIWA:    COWS:     Musculoskeletal: Strength & Muscle Tone: within  normal limits Gait & Station: normal Patient leans: N/A  Psychiatric Specialty Exam: Physical Exam  Nursing note and vitals reviewed. Constitutional: She appears well-developed and well-nourished.  HENT:  Head: Normocephalic and atraumatic.  Eyes: Conjunctivae are normal. Pupils are equal, round, and reactive to light.  Neck: Normal range of motion.  Cardiovascular: Regular rhythm and normal heart sounds.  Respiratory: Effort normal. No respiratory distress.  GI: Soft.  Musculoskeletal: Normal range of motion.  Neurological: She is alert.  Skin: Skin is warm and dry.  Psychiatric: Her affect is labile. Her speech is tangential. She is agitated and hyperactive. She is not aggressive and not combative. Thought content is not paranoid. She expresses impulsivity. She expresses no homicidal and no suicidal ideation. She exhibits abnormal recent memory.    Review of Systems  Constitutional: Negative.   HENT: Negative.   Eyes: Negative.   Respiratory: Negative.   Cardiovascular: Negative.   Gastrointestinal: Negative.   Musculoskeletal: Negative.   Skin: Negative.   Neurological: Negative.   Psychiatric/Behavioral: Negative for depression, hallucinations, memory loss, substance abuse and suicidal ideas. The patient is nervous/anxious and has insomnia.     Blood pressure 112/89, pulse 95, temperature 98 F (36.7 C), temperature source Oral, resp. rate  18, height _0  (1.651 m), weight 64.9 kg (143 lb), SpO2 91 %.Body mass index is 23.8 kg/m.  General Appearance: Casual  Eye Contact:  Fair  Speech:  Pressured  Volume:  Increased  Mood:  Euthymic  Affect:  Inappropriate  Thought Process:  Disorganized  Orientation:  Full (Time, Place, and Person)  Thought Content:  Illogical  Suicidal Thoughts:  No  Homicidal Thoughts:  No  Memory:  Immediate;   Fair Recent;   Fair Remote;   Fair  Judgement:  Impaired  Insight:  Shallow  Psychomotor Activity:  Increased and Restlessness  Concentration:  Concentration: Poor  Recall:  [employer] of Knowledge:  Fair  Language:  Fair  Akathisia:  No  Handed:  Right  AIMS (if indicated):     Assets:  Desire for Improvement Financial Resources/Insurance Housing Social Support  ADL's:  Intact  Cognition:  Impaired,  Mild  Sleep:  Number of Hours: 2.15(Anxious abou upcoming ECT)     Treatment Plan Summary: Daily contact with patient to assess and evaluate symptoms and progress in treatment, Medication management and Plan Patient will continue with ECT treatment 3 times a week.  Continue medication management and monitoring of physical complaints as well.  Alethia Berthold, MD 07/21/2017, 8:19 PM

## 2017-07-21 NOTE — H&P (Signed)
Jeanne Hendricks is an 47 y.o. female.   Chief Complaint: Patient is agitated disorganized mood he HPI: History of bipolar disorder with current depressed episode with psychotic features  Past Medical History:  Diagnosis Date  . Depression   . Diabetes mellitus without complication ([place])   . Hypertension   . Thyroid disease     History reviewed. No pertinent surgical history.  History reviewed. No pertinent family history. Social History:  reports that she has quit smoking. She has quit using smokeless tobacco. She reports that she does not drink alcohol or use drugs.  Allergies:  Allergies  Allergen Reactions  . Bactrim [Sulfamethoxazole-Trimethoprim]   . Compazine [Prochlorperazine Edisylate]     Medications Prior to Admission  Medication Sig Dispense Refill  . docusate sodium (COLACE) 100 MG capsule Take 100 mg by mouth daily as needed for mild constipation.    Marland Kitchen levothyroxine (SYNTHROID, LEVOTHROID) 50 MCG tablet Take 50 mcg by mouth daily before breakfast.    . mirtazapine (REMERON SOL-TAB) 30 MG disintegrating tablet Take 30 mg by mouth at bedtime.    Marland Kitchen OLANZapine (ZYPREXA) 20 MG tablet Take 20 mg by mouth at bedtime.    . pantoprazole (PROTONIX) 40 MG tablet Take 40 mg by mouth daily.    . QUEtiapine (SEROQUEL) 25 MG tablet Take 25 mg by mouth 2 (two) times daily as needed (panic attacks).    . QUEtiapine (SEROQUEL) 400 MG tablet Take 400 mg by mouth at bedtime.    Marland Kitchen QUEtiapine (SEROQUEL) 50 MG tablet Take 50 mg by mouth 2 (two) times daily as needed (panic attacks).    Marland Kitchen atorvastatin (LIPITOR) 10 MG tablet Take 10 mg by mouth at bedtime.    . diclofenac (VOLTAREN) 75 MG EC tablet Take 75 mg by mouth 2 (two) times daily.    Marland Kitchen losartan (COZAAR) 50 MG tablet Take 50 mg by mouth daily after breakfast.    . metFORMIN (GLUCOPHAGE) 500 MG tablet Take 500 mg by mouth daily before breakfast.    . propranolol (INDERAL) 20 MG tablet Take 20 mg by mouth 2 (two) times daily.       Results for orders placed or performed during the hospital encounter of 07/05/17 (from the past 48 hour(s))  Basic metabolic panel     Status: Abnormal   Collection Time: 07/20/17  6:42 PM  Result Value Ref Range   Sodium 139 135 - 145 mmol/L   Potassium 3.5 3.5 - 5.1 mmol/L   Chloride 105 101 - 111 mmol/L   CO2 25 22 - 32 mmol/L   Glucose, Bld 155 (H) 65 - 99 mg/dL   BUN 16 6 - 20 mg/dL   Creatinine, Ser 0.79 0.44 - 1.00 mg/dL   Calcium 8.7 (L) 8.9 - 10.3 mg/dL   GFR calc non Af Amer >60 >60 mL/min   GFR calc Af Amer >60 >60 mL/min    Comment: (NOTE) The eGFR has been calculated using the CKD EPI equation. This calculation has not been validated in all clinical situations. eGFR's persistently <60 mL/min signify possible Chronic Kidney Disease.    Anion gap 9 5 - 15    Comment: Performed at [facility], [address] [place], [place] [address]  Glucose, capillary     Status: None   Collection Time: 07/21/17  5:44 AM  Result Value Ref Range   Glucose-Capillary 82 65 - 99 mg/dL   Dg Chest 2 View  Result Date: 07/21/2017 CLINICAL DATA:  Pre anesthesia.  EXAM: CHEST  2 VIEW COMPARISON:  None. FINDINGS: The cardiomediastinal contours are normal. Minimal left lung base atelectasis. Pulmonary vasculature is normal. No consolidation, pleural effusion, or pneumothorax. No acute osseous abnormalities are seen. IMPRESSION: Minimal left lung base atelectasis. Electronically Signed   By: Jeb Levering M.D.   On: 07/21/2017 06:06    Review of Systems  Constitutional: Negative.   HENT: Negative.   Eyes: Negative.   Respiratory: Negative.   Cardiovascular: Negative.   Gastrointestinal: Negative.   Musculoskeletal: Negative.   Skin: Negative.   Neurological: Negative.   Psychiatric/Behavioral: Positive for depression. Negative for hallucinations, memory loss, substance abuse and suicidal ideas. The patient is nervous/anxious and has insomnia.     Blood pressure (!)  120/93, pulse 78, temperature 97.8 F (36.6 C), temperature source Oral, resp. rate 18, height '5\' 5"'$  (1.651 m), weight 64.9 kg (143 lb), SpO2 100 %. Physical Exam  Nursing note and vitals reviewed. Constitutional: She appears well-developed and well-nourished.  HENT:  Head: Normocephalic and atraumatic.  Eyes: Conjunctivae are normal. Pupils are equal, round, and reactive to light.  Neck: Normal range of motion.  Cardiovascular: Regular rhythm and normal heart sounds.  Respiratory: Effort normal. No respiratory distress.  GI: Soft.  Musculoskeletal: Normal range of motion.  Neurological: She is alert.  Skin: Skin is warm and dry.  Psychiatric: Her mood appears anxious. Her affect is labile and inappropriate. Her speech is tangential. She is agitated. She is not aggressive. Thought content is paranoid. Cognition and memory are impaired. She expresses impulsivity. She expresses no homicidal and no suicidal ideation.     Assessment/Plan ECT right unilateral starting today follow up into next week  Alethia Berthold, MD 07/21/2017, 11:53 AM

## 2017-07-21 NOTE — Transfer of Care (Signed)
Immediate Anesthesia Transfer of Care Note  Patient: Jeanne Hendricks  Procedure(s) Performed: ECT TX  Patient Location: PACU  Anesthesia Type:General  Level of Consciousness: awake and alert   Airway & Oxygen Therapy: Patient Spontanous Breathing  Post-op Assessment: Report given to RN and Post -op Vital signs reviewed and stable  Post vital signs: Reviewed and stable  Last Vitals:  Vitals:   07/21/17 0941 07/21/17 1215  BP: (!) 120/93 125/65  Pulse: 78 (!) 105  Resp:  (!) 24  Temp: 36.6 C 36.7 C  SpO2:  100%    Last Pain:  Vitals:   07/21/17 0941  TempSrc: Oral  PainSc:          Complications: No apparent anesthesia complications

## 2017-07-21 NOTE — Plan of Care (Signed)
  Progressing Spiritual Needs Ability to function at adequate level 07/21/2017 1710 - Progressing by Elige Radonobb, Vallie Fayette B, RN Activity: Interest or engagement in leisure activities will improve 07/21/2017 1710 - Progressing by Elige Radonobb, Nailyn Dearinger B, RN Coping: Ability to verbalize feelings will improve 07/21/2017 1710 - Progressing by Elige Radonobb, Nicholi Ghuman B, RN Health Behavior/Discharge Planning: Compliance with therapeutic regimen will improve 07/21/2017 1710 - Progressing by Elige Radonobb, Armanie Martine B, RN Safety: Ability to disclose and discuss suicidal ideas will improve 07/21/2017 1710 - Progressing by Elige Radonobb, Loreena Valeri B, RN Ability to identify and utilize support systems that promote safety will improve 07/21/2017 1710 - Progressing by Elige Radonobb, Sarkis Rhines B, RN  Patient had ECT today tolerated well.  Patient continues to be intrusive.  Questions all medications that she is given and once medications are given will return several times to ask what the medication is and what it is for . Support and encouragement offered.  Safety maintatined on the unit.

## 2017-07-21 NOTE — Progress Notes (Signed)
Pt very anxious haldol given

## 2017-07-21 NOTE — [facility] Group Notes (Signed)
LCSW Group Therapy Note  07/21/2017 1:00 PM  Type of Therapy and Topic:  Group Therapy:  Feelings around Relapse and Recovery  Participation Level:  Did Not Attend   Description of Group:    Patients in this group will discuss emotions they experience before and after a relapse. They will process how experiencing these feelings, or avoidance of experiencing them, relates to having a relapse. Facilitator will guide patients to explore emotions they have related to recovery. Patients will be encouraged to process which emotions are more powerful. They will be guided to discuss the emotional reaction significant others in their lives may have to their relapse or recovery. Patients will be assisted in exploring ways to respond to the emotions of others without this contributing to a relapse.  Therapeutic Goals: 1. Patient will identify two or more emotions that lead to a relapse for them 2. Patient will identify two emotions that result when they relapse 3. Patient will identify two emotions related to recovery 4. Patient will demonstrate ability to communicate their needs through discussion and/or role plays   Summary of Patient Progress:     Therapeutic Modalities:   Cognitive Behavioral Therapy Solution-Focused Therapy Assertiveness Training Relapse Prevention Therapy   Jeanne FrameSonya S Josel Keo, LCSW 07/21/2017 2:43 PM

## 2017-07-21 NOTE — Progress Notes (Signed)
Versed given 2mg  trying to get out of bed

## 2017-07-21 NOTE — Plan of Care (Signed)
Patient slept for Estimated Hours of 2.15, anxious about upcoming ECT; Precautionary checks every 15 minutes for safety maintained, room free of safety hazards, patient sustains no injury or falls during this shift. NPO past MN for ECT, Chest X-ray Completed, Pre-ECT CBG=82, MAR reviewed for anti-hypertensive medications, none on her regimen.

## 2017-07-21 NOTE — Anesthesia Post-op Follow-up Note (Signed)
Anesthesia QCDR form completed.        

## 2017-07-21 NOTE — Anesthesia Preprocedure Evaluation (Signed)
Anesthesia Evaluation  Patient identified by MRN, date of birth, ID band Patient awake    Reviewed: Allergy & Precautions, NPO status , Patient's Chart, lab work & pertinent test results, reviewed documented beta blocker date and time   Airway Mallampati: II  TM Distance: >3 FB     Dental  (+) Chipped   Pulmonary former smoker,           Cardiovascular hypertension, Pt. on medications      Neuro/Psych PSYCHIATRIC DISORDERS Anxiety Depression Bipolar Disorder Schizophrenia    GI/Hepatic   Endo/Other  diabetes, Type 2Hypothyroidism   Renal/GU      Musculoskeletal   Abdominal   Peds  Hematology   Anesthesia Other Findings   Reproductive/Obstetrics                             Anesthesia Physical Anesthesia Plan  ASA: III  Anesthesia Plan: General   Post-op Pain Management:    Induction:   PONV Risk Score and Plan:   Airway Management Planned: Mask  Additional Equipment:   Intra-op Plan:   Post-operative Plan:   Informed Consent: I have reviewed the patients History and Physical, chart, labs and discussed the procedure including the risks, benefits and alternatives for the proposed anesthesia with the patient or authorized representative who has indicated his/her understanding and acceptance.     Plan Discussed with: CRNA  Anesthesia Plan Comments:         Anesthesia Quick Evaluation

## 2017-07-21 NOTE — Procedures (Signed)
ECT SERVICES Physician's Interval Evaluation & Treatment Note  Patient Identification: Jeanne Hendricks MRN:  [id number] Date of Evaluation:  07/21/2017 TX #: 1  MADRS: 48  MMSE: 30  P.E. Findings:  Normal physical exam  Psychiatric Interval Note:  Patient remains confused agitated labile  Subjective:  Patient is a 47 y.o. female seen for evaluation for Electroconvulsive Therapy. Still feeling a bit agitated.  Treatment Summary:   [x]   Right Unilateral             []  Bilateral   % Energy : 0.3 ms 50%   Impedance: 1580 ohms  Seizure Energy Index: 8280 V squared  Postictal Suppression Index: 42%  Seizure Concordance Index: 97%  Medications  Pre Shock: Brevital 80 mg succinylcholine 80 mg  Post Shock:    Seizure Duration: 33 seconds by EMG 56 seconds by EEG   Comments: Follow-up Monday Wednesday Friday  Lungs:  [x]   Clear to auscultation               []  Other:   Heart:    [x]   Regular rhythm             []  irregular rhythm    [x]   Previous H&P reviewed, patient examined and there are NO CHANGES                 []   Previous H&P reviewed, patient examined and there are changes noted.   Mordecai RasmussenJohn Tuere Nwosu, MD 2/15/201911:54 AM

## 2017-07-21 NOTE — Progress Notes (Signed)
Patient ID: Jeanne Hendricks, female   DOB: 16-Nov-1970, 47 y.o.   MRN: [id number] Anxious, restless, intrusive still, needy, frequently asking the same question multiple times, denied SI/HI/AVH. NPO after MN for ECT, will escort to Radiology for C-Xray, obtain Pre-ECT CBG. Oncoming nursing staffs will assist with ADL prior to therapy.

## 2017-07-22 MED ORDER — ZIPRASIDONE MESYLATE 20 MG IM SOLR
20.0000 mg | Freq: Once | INTRAMUSCULAR | Status: AC
  Administered 2017-07-22: 20 mg via INTRAMUSCULAR
  Filled 2017-07-22: qty 20

## 2017-07-22 NOTE — Plan of Care (Signed)
Pt. Compliant with medications this evening. Pt. Denies SI/HI. Pt. Verbally is able to contract for safety. Pt. Verbalizes she can remain safe while on the unit. Pt. Reports doing, "better" today then she did yesterday. Pt. Continues to be very intrusive to staff and peers. Pt. Very obsessive with questions and will repeat questions, despite sufficient answers to questions. Pt. Did not sleep well last night due to medication given she states. Pt. Needs reinforcement on provided education. Pt. Does not go to groups.     Not Progressing Spiritual Needs Ability to function at adequate level 07/22/2017 0144 - Not Progressing by Lenox PondsStevens, Daved Mcfann J, RN Activity: Interest or engagement in leisure activities will improve 07/22/2017 0144 - Not Progressing by Lenox PondsStevens, Siomara Burkel J, RN Imbalance in normal sleep/wake cycle will improve 07/22/2017 0144 - Not Progressing by Lenox PondsStevens, Avannah Decker J, RN Education: Knowledge of the prescribed therapeutic regimen will improve 07/22/2017 0144 - Not Progressing by Lenox PondsStevens, Malikiah Debarr J, RN Coping: Ability to cope will improve 07/22/2017 0144 - Not Progressing by Lenox PondsStevens, Prescilla Monger J, RN Ability to verbalize feelings will improve 07/22/2017 0144 - Not Progressing by Lenox PondsStevens, Modena Bellemare J, RN [facility] Participation in Recreation Therapeutic Interventions STG-Other Recreation Therapy Goal (Specify) Description Patient will identify 3 positive coping skills to use post discharge x5 days.  07/22/2017 0144 - Not Progressing by Lenox PondsStevens, Jimel Myler J, RN Activity: Sleeping patterns will improve 07/22/2017 0144 - Not Progressing by Lenox PondsStevens, Terilynn Buresh J, RN   Progressing Health Behavior/Discharge Planning: Compliance with therapeutic regimen will improve 07/22/2017 0144 - Progressing by Lenox PondsStevens, Francesco Provencal J, RN Safety: Ability to disclose and discuss suicidal ideas will improve 07/22/2017 0144 - Progressing by Lenox PondsStevens, Korinna Tat J, RN Self-Concept: Ability to verbalize positive feelings about self will  improve 07/22/2017 0144 - Progressing by Lenox PondsStevens, Reaghan Kawa J, RN

## 2017-07-22 NOTE — Progress Notes (Signed)
D: Patient  Requested  Linen  To bath with  But did not do so. Patient constantly at nursing  Station . Calling staff by names  Repeating herself over and over  Limited time spent  With  Her peers  Only staff. Patient has been intrusive ,  argumentative . Patient went into another patient room . That patient argued  With Harriett SineNancy  About being her room . Patient challenged staff once again by stating she was going down the hall that wasn't  Hers. Stated to Clinical research associatewriter  isn't John on that hall .  Stated to Clinical research associatewriter I'm going  Down that hall. Staff continue to intervene  Through out am .  Stated to staff  She wanted to be punished  Wanted to go into the quiet room  ( seclusion) Instructed patient we don't want her to go into the quiet room there are other ways of handling  Her compulsions . A: Encourage patient participation with unit programming . Instruction  Given on  Medication , verbalize understanding. R: Voice no other concerns. Staff continue to monitor

## 2017-07-22 NOTE — Progress Notes (Signed)
Patient ID: Jeanne Hendricks, female   DOB: 12/03/1970, 47 y.o.   MRN: [id number] Update to earlier progress note.  Patient has continued to be excessively intrusive and disruptive.  Although she is not violent she is doing things that may end up inciting problems such as insisting on walking into other patient's rooms.  She is not able to keep herself under control with verbal redirection.  Patient is agreeable to getting some medicine and actually requests a "shot".  I think 20 mg of Geodon IM would be an appropriate treatment.  I have ordered this as a one-time for now.

## 2017-07-22 NOTE — [facility] Group Notes (Signed)
LCSW Group Therapy Note  07/22/2017 1:15pm  Type of Therapy and Topic:  Group Therapy:  Fears and Unhealthy Coping Skills  Participation Level:  Did Not Attend   Description of Group:  The focus of this group was to discuss some of the prevalent fears that patients experience, and to identify the commonalities among group members.  An exercise was used to initiate the discussion, followed by writing on the white board a group-generated list of unhealthy coping and healthy coping techniques to deal with each fear.    Therapeutic Goals: 1. Patient will identify and describe 3 fears they experience 2. Patient will identify one positive coping strategy for each fear they experience 3. Patient will respond empathically to peers statements regarding fears they experience  Summary of Patient Progress:       Therapeutic Modalities Cognitive Behavioral Therapy Motivational Interviewing  Jeanne Hendricks  CUEBAS-COLON, LCSW 07/22/2017 11:44 AM  

## 2017-07-22 NOTE — Progress Notes (Signed)
[facility] MD Progress Note  07/22/2017 12:38 PM Jeanne Hendricks  MRN:  [id number] Subjective: Follow-up note for 47 year old woman with a history of schizoaffective disorder.  Patient had her first ECT treatment yesterday.  The treatment went fine with only slight agitation in the recovery room that was easily managed.  On interview today the patient has no new complaints.  She continues to be very intrusive on the unit.  She will knock on the door and ask the same question repeatedly through the day if not redirected.  Her affect is a little constricted and her thoughts still seem odd with impaired memory but is not making any frankly bizarre statements and has not been acutely dangerous.  No new physical complaints Principal Problem: Schizoaffective disorder, bipolar type ([place]) Diagnosis:   Patient Active Problem List   Diagnosis Date Noted  . OCD (obsessive compulsive disorder) [F42.9] 07/06/2017  . Schizoaffective disorder, bipolar type ([place]) [F25.0] 07/04/2017  . HTN (hypertension) [I10] 07/04/2017  . Diabetes ([place]) [E11.9] 07/04/2017  . Hypothyroidism [E03.9] 07/04/2017  . Suicidal ideation [R45.851] 07/04/2017   Total Time spent with patient: 20 minutes  Past Psychiatric History: Patient has a history of bipolar versus schizoaffective disorder and had been hospitalized previously in [place] where she had been living.  We do not yet have records of those hospitalizations or treatment.  Previous diagnosis of either bipolar or schizoaffective disorder.  Past Medical History:  Past Medical History:  Diagnosis Date  . Depression   . Diabetes mellitus without complication ([place])   . Hypertension   . Thyroid disease    History reviewed. No pertinent surgical history. Family History: History reviewed. No pertinent family history. Family Psychiatric  History: Unknown Social History:  Social History   Substance and Sexual Activity  Alcohol Use No  . Frequency: Never     Social History    Substance and Sexual Activity  Drug Use No    Social History   Socioeconomic History  . Marital status: Single    Spouse name: None  . Number of children: None  . Years of education: None  . Highest education level: None  Social Needs  . Financial resource strain: None  . Food insecurity - worry: None  . Food insecurity - inability: None  . Transportation needs - medical: None  . Transportation needs - non-medical: None  Occupational History  . None  Tobacco Use  . Smoking status: Former Research scientist (life sciences)  . Smokeless tobacco: Former Network engineer and Sexual Activity  . Alcohol use: No    Frequency: Never  . Drug use: No  . Sexual activity: None  Other Topics Concern  . None  Social History Narrative  . None   Additional Social History:                         Sleep: Fair  Appetite:  Fair  Current Medications: Current Facility-Administered Medications  Medication Dose Route Frequency Provider Last Rate Last Dose  . acetaminophen (TYLENOL) tablet 650 mg  650 mg Oral Q6H PRN Pucilowska, Jolanta B, MD   650 mg at 07/12/17 0832  . alum & mag hydroxide-simeth (MAALOX/MYLANTA) 200-200-20 MG/5ML suspension 30 mL  30 mL Oral Q4H PRN Pucilowska, Jolanta B, MD      . atorvastatin (LIPITOR) tablet 10 mg  10 mg Oral q1800 Pucilowska, Jolanta B, MD   10 mg at 07/21/17 1812  . feeding supplement (ENSURE ENLIVE) (ENSURE ENLIVE) liquid 237 mL  237 mL Oral TID BM Pucilowska, Jolanta B, MD   237 mL at 07/22/17 1034  . fluvoxaMINE (LUVOX) tablet 200 mg  200 mg Oral QHS Pucilowska, Jolanta B, MD   200 mg at 07/21/17 2103  . levothyroxine (SYNTHROID, LEVOTHROID) tablet 100 mcg  100 mcg Oral QAC breakfast Pucilowska, Jolanta B, MD   100 mcg at 07/22/17 0812  . magnesium hydroxide (MILK OF MAGNESIA) suspension 30 mL  30 mL Oral Daily PRN Pucilowska, Jolanta B, MD   30 mL at 07/14/17 1719  . midazolam (VERSED) injection 2 mg  2 mg Intravenous Once Clapacs, John T, MD      . ondansetron  [facility]) injection 4 mg  4 mg Intravenous Once PRN Gunnar Bulla, MD      . temazepam (RESTORIL) capsule 7.5 mg  7.5 mg Oral QHS Pucilowska, Jolanta B, MD   7.5 mg at 07/21/17 2104    Lab Results:  Results for orders placed or performed during the hospital encounter of 07/05/17 (from the past 48 hour(s))  Basic metabolic panel     Status: Abnormal   Collection Time: 07/20/17  6:42 PM  Result Value Ref Range   Sodium 139 135 - 145 mmol/L   Potassium 3.5 3.5 - 5.1 mmol/L   Chloride 105 101 - 111 mmol/L   CO2 25 22 - 32 mmol/L   Glucose, Bld 155 (H) 65 - 99 mg/dL   BUN 16 6 - 20 mg/dL   Creatinine, Ser 0.79 0.44 - 1.00 mg/dL   Calcium 8.7 (L) 8.9 - 10.3 mg/dL   GFR calc non Af Amer >60 >60 mL/min   GFR calc Af Amer >60 >60 mL/min    Comment: (NOTE) The eGFR has been calculated using the CKD EPI equation. This calculation has not been validated in all clinical situations. eGFR's persistently <60 mL/min signify possible Chronic Kidney Disease.    Anion gap 9 5 - 15    Comment: Performed at [facility], [address] [place], [place] [address]  Glucose, capillary     Status: None   Collection Time: 07/21/17  5:44 AM  Result Value Ref Range   Glucose-Capillary 82 65 - 99 mg/dL  Glucose, capillary     Status: None   Collection Time: 07/21/17 12:21 PM  Result Value Ref Range   Glucose-Capillary 97 65 - 99 mg/dL    Blood Alcohol level:  Lab Results  Component Value Date   ETH <10 19/14/7829    Metabolic Disorder Labs: Lab Results  Component Value Date   HGBA1C 5.1 07/06/2017   MPG 99.67 07/06/2017   No results found for: PROLACTIN Lab Results  Component Value Date   CHOL 103 07/06/2017   TRIG 112 07/06/2017   HDL 39 (L) 07/06/2017   CHOLHDL 2.6 07/06/2017   VLDL 22 07/06/2017   LDLCALC 42 07/06/2017    Physical Findings: AIMS: Facial and Oral Movements Muscles of Facial Expression: None, normal Lips and Perioral Area: None, normal Jaw: None,  normal Tongue: None, normal,Extremity Movements Upper (arms, wrists, hands, fingers): None, normal Lower (legs, knees, ankles, toes): None, normal, Trunk Movements Neck, shoulders, hips: None, normal, Overall Severity Severity of abnormal movements (highest score from questions above): None, normal Incapacitation due to abnormal movements: None, normal Patient's awareness of abnormal movements (rate only patient's report): No Awareness, Dental Status Current problems with teeth and/or dentures?: No Does patient usually wear dentures?: No  CIWA:    COWS:     Musculoskeletal: Strength &  Muscle Tone: within normal limits Gait & Station: normal Patient leans: N/A  Psychiatric Specialty Exam: Physical Exam  Nursing note and vitals reviewed. Constitutional: She appears well-developed and well-nourished.  HENT:  Head: Normocephalic and atraumatic.  Eyes: Conjunctivae are normal. Pupils are equal, round, and reactive to light.  Neck: Normal range of motion.  Cardiovascular: Regular rhythm and normal heart sounds.  Respiratory: Effort normal. No respiratory distress.  GI: Soft.  Musculoskeletal: Normal range of motion.  Neurological: She is alert.  Skin: Skin is warm and dry.  Psychiatric: Her mood appears anxious. Her speech is tangential. She is agitated and hyperactive. She is not aggressive and not combative. Thought content is not paranoid. Cognition and memory are impaired. She expresses impulsivity. She expresses no homicidal and no suicidal ideation.    Review of Systems  Constitutional: Negative.   HENT: Negative.   Eyes: Negative.   Respiratory: Negative.   Cardiovascular: Negative.   Gastrointestinal: Negative.   Musculoskeletal: Negative.   Skin: Negative.   Neurological: Negative.   Psychiatric/Behavioral: Positive for memory loss. Negative for depression, hallucinations, substance abuse and suicidal ideas. The patient is nervous/anxious. The patient does not have  insomnia.     Blood pressure 113/85, pulse (!) 118, temperature 98.4 F (36.9 C), temperature source Oral, resp. rate 18, height '5\' 5"'$  (1.651 m), weight 64.9 kg (143 lb), SpO2 91 %.Body mass index is 23.8 kg/m.  General Appearance: Casual  Eye Contact:  Good  Speech:  Pressured  Volume:  Increased  Mood:  Anxious  Affect:  Constricted  Thought Process:  Disorganized  Orientation:  Full (Time, Place, and Person)  Thought Content:  Illogical  Suicidal Thoughts:  No  Homicidal Thoughts:  No  Memory:  Immediate;   Fair Recent;   Fair Remote;   Fair  Judgement:  Fair  Insight:  Fair  Psychomotor Activity:  Restlessness  Concentration:  Concentration: Fair  Recall:  [employer] of Knowledge:  Fair  Language:  Fair  Akathisia:  No  Handed:  Right  AIMS (if indicated):     Assets:  Desire for Improvement Housing Physical Health Resilience Social Support  ADL's:  Impaired  Cognition:  Impaired,  Mild  Sleep:  Number of Hours: 6.45     Treatment Plan Summary: Daily contact with patient to assess and evaluate symptoms and progress in treatment, Medication management and Plan 47 year old woman with bipolar versus schizoaffective disorder.  She is starting to show improvement.  She still however is intrusive and agitated and has disorganized thinking.  Tolerated ECT well without any difficulty yesterday and I remain optimistic about her chance of improvement with it.  Answered all of her questions and tried to reassure her today.  No need for any medicine change.  Alethia Berthold, MD 07/22/2017, 12:38 PM

## 2017-07-22 NOTE — [facility] Group Notes (Signed)
[facility] Group Notes:  (Nursing/MHT/Case Management/Adjunct)  Date:  07/22/2017  Time:  7:47 AM  Type of Therapy:  Psychoeducational Skills  Participation Level:  Did Not Attend   Summary of Progress/Problems:  Chancy MilroyLaquanda Y Sabas Frett 07/22/2017, 7:47 AM

## 2017-07-22 NOTE — Plan of Care (Signed)
Pt. Compliant with medications this evening. Pt. Denies SI/HI. Pt. Verbally is able to contract for safety. Pt. Verbalizes she can remain safe while on the unit. Pt. Reports doing, "better" today then she did yesterday. Pt. Continues to be very intrusive to staff and peers. Pt. Very obsessive with questions and will repeat questions, despite sufficient answers to questions. Pt. Reports sleeping good. Pt. Needs reinforcement on provided education. Pt. Does not go to groups.    Progressing Activity: Imbalance in normal sleep/wake cycle will improve 07/22/2017 2358 - Progressing by Lenox PondsStevens, Kota Ciancio J, RN Coping: Ability to verbalize feelings will improve 07/22/2017 2358 - Progressing by Lenox PondsStevens, Mirna Sutcliffe J, RN Health Behavior/Discharge Planning: Compliance with therapeutic regimen will improve 07/22/2017 2358 - Progressing by Lenox PondsStevens, Jeslin Bazinet J, RN Safety: Ability to disclose and discuss suicidal ideas will improve 07/22/2017 2358 - Progressing by Lenox PondsStevens, Auri Jahnke J, RN Self-Concept: Ability to verbalize positive feelings about self will improve 07/22/2017 2358 - Progressing by Lenox PondsStevens, Kayshaun Polanco J, RN Activity: Sleeping patterns will improve 07/22/2017 2358 - Progressing by Lenox PondsStevens, Adrin Julian J, RN   Not Progressing Spiritual Needs Ability to function at adequate level 07/22/2017 2358 - Not Progressing by Lenox PondsStevens, Miriah Maruyama J, RN Activity: Interest or engagement in leisure activities will improve 07/22/2017 2358 - Not Progressing by Lenox PondsStevens, Janica Eldred J, RN Education: Knowledge of the prescribed therapeutic regimen will improve 07/22/2017 2358 - Not Progressing by Lenox PondsStevens, Mirabelle Cyphers J, RN Role Relationship: Ability to demonstrate positive changes in social behaviors and relationships will improve 07/22/2017 2358 - Not Progressing by Lenox PondsStevens, Viyaan Champine J, RN [facility] Participation in Recreation Therapeutic Interventions STG-Other Recreation Therapy Goal (Specify) Description Patient will identify 3 positive coping skills to use  post discharge x5 days.  07/22/2017 2358 - Not Progressing by Lenox PondsStevens, Jeanann Balinski J, RN

## 2017-07-22 NOTE — Progress Notes (Signed)
D:Pt denies SI/HI/AVH. Pt. Verbally is able to contract for safety. Pt. Verbalizes she can remain safe while on the unit.  Pt is: intrusive, labile, anxious, tangential, attention-seeking, childlike, and needy upon presentation. Pt. has no Complaints, but frequently asks the same questions over and over again, requiring frequent redirection from staff, despite questions being answered extensively. Pt. Continues to not attend to hygiene and ADls. Pt. Reports doing, "better" today then she did yesterday. Pt. Did not sleep well last night due to medication given she states. Pt. Appetite is fair, but not eating much.   A: Q x 15 minute observation checks were completed for safety. Patient was provided with education. Pt. Needs reinforcement on provided education. Patient was given scheduled medications. Patient  was encourage to attend groups, participate in unit activities and continue with plan of care.   R:Patient is complaint with medication and unit procedures. Pt. Does not go to groups.            Patient slept for Estimated Hours of 6.45; Precautionary checks every 15 minutes for safety maintained, room free of safety hazards, patient sustains no injury or falls during this shift.

## 2017-07-23 ENCOUNTER — Other Ambulatory Visit: Payer: Self-pay | Admitting: Psychiatry

## 2017-07-23 MED ORDER — QUETIAPINE FUMARATE 100 MG PO TABS
100.0000 mg | ORAL_TABLET | Freq: Once | ORAL | Status: AC
  Administered 2017-07-23: 100 mg via ORAL
  Filled 2017-07-23: qty 1

## 2017-07-23 MED ORDER — LORAZEPAM 2 MG PO TABS
2.0000 mg | ORAL_TABLET | ORAL | Status: DC | PRN
  Administered 2017-07-23 – 2017-07-28 (×7): 2 mg via ORAL
  Filled 2017-07-23 (×7): qty 1

## 2017-07-23 MED ORDER — QUETIAPINE FUMARATE 100 MG PO TABS: 100.0000 mg | ORAL_TABLET | Freq: Every day | ORAL | Status: DC

## 2017-07-23 MED ORDER — ZIPRASIDONE MESYLATE 20 MG IM SOLR
20.0000 mg | Freq: Once | INTRAMUSCULAR | Status: AC
  Administered 2017-07-23: 20 mg via INTRAMUSCULAR
  Filled 2017-07-23: qty 20

## 2017-07-23 MED ORDER — QUETIAPINE FUMARATE 200 MG PO TABS
200.0000 mg | ORAL_TABLET | Freq: Every day | ORAL | Status: DC
  Administered 2017-07-23: 200 mg via ORAL
  Filled 2017-07-23: qty 1

## 2017-07-23 MED ORDER — SENNA 8.6 MG PO TABS
2.0000 | ORAL_TABLET | Freq: Once | ORAL | Status: AC
  Administered 2017-07-23: 17.2 mg via ORAL
  Filled 2017-07-23: qty 2

## 2017-07-23 NOTE — Plan of Care (Signed)
Patient is alert and oriented. Patient denies SI, HI and AVH. Patient is very impulsive; intrusive; attention seeking and anxious. Patient has to be redirected several times, and does not take redirection well. Patient spends most of the time pacing the hallways and tapping at nurses station window. Patient educated on coping skills. Nurse gave patient coloring sheets; encouraged patient to read books or magazine or walk in the hallway; practice deep breathing exercise to help with anxiety. Patient goes into her room and hits call bell light and walks out of room up to nurses station. Patient asked if she needs anything, "No, I can turn off the light."  Patient is very fixated on the security guard this morning staring at guard through the window, several times patient has been told to step away from the window and away from the door to the nurses station. Patient will attempt to place foot inside nurses station door. Patient complains of having a foot fungus; not being able to urinate which is untrue patient has been urinating. Patient is very needy and wants attention from all staff. Patient purposefully laid on the ground while walking in the hall way with this nurse.  Spiritual Needs Ability to function at adequate level 07/23/2017 1035 - Not Progressing by Leamon ArntPowell, Taurean Ju K, RN   Activity: Interest or engagement in leisure activities will improve 07/23/2017 1035 - Not Progressing by Leamon ArntPowell, Nohemy Koop K, RN Imbalance in normal sleep/wake cycle will improve 07/23/2017 1035 - Not Progressing by Leamon ArntPowell, Jaelen Gellerman K, RN   Education: Utilization of techniques to improve thought processes will improve 07/23/2017 1035 - Not Progressing by Leamon ArntPowell, Marthena Whitmyer K, RN Knowledge of the prescribed therapeutic regimen will improve 07/23/2017 1035 - Not Progressing by Leamon ArntPowell, Caelin Rayl K, RN  Nurse will continue to monitor patient.

## 2017-07-23 NOTE — Progress Notes (Signed)
Patient was able to urinate today; After ativan and Geodon patient is now resting in bed, no distress noted. RN will continue to monitor.

## 2017-07-23 NOTE — Progress Notes (Signed)
[facility]  07/23/2017 3:03 PM Jeanne Gianottiancy Jean Gugliotta  MRN:  [id number] Subjective: 47 year old woman with bipolar disorder.  She is decompensated today.  All day long she has been agitated and intrusive.  In the morning she was banging her head on the nurses window to get attention.  She has a few very specific complaints but reports that she is just feeling bad and is agitated.  She was constantly intrusive to me asking for more "shots".  She looks like she is trembling all over.  She told me at one point that she could not "pee".  She could not really describe what she meant by that.  I have a feeling she might even be talking about being constipated she is so disorganized. Principal Problem: Schizoaffective disorder, bipolar type ([facility]) Diagnosis:   Patient Active Problem List   Diagnosis Date Noted  . OCD (obsessive compulsive disorder) [F42.9] 07/06/2017  . Schizoaffective disorder, bipolar type ([facility]) [F25.0] 07/04/2017  . HTN (hypertension) [I10] 07/04/2017  . Diabetes ([facility]) [E11.9] 07/04/2017  . Hypothyroidism [E03.9] 07/04/2017  . Suicidal ideation [R45.851] 07/04/2017   Total Time spent with patient: 30 minutes  Past Psychiatric History: Patient has a history of bipolar disorder or schizoaffective disorder  Past Medical History:  Past Medical History:  Diagnosis Date  . Depression   . Diabetes mellitus without complication ([facility])   . Hypertension   . Thyroid disease    History reviewed. No pertinent surgical history. Family History: History reviewed. No pertinent family history. Family Psychiatric  History: Unknown Social History:  Social History   Substance and Sexual Activity  Alcohol Use No  . Frequency: Never     Social History   Substance and Sexual Activity  Drug Use No    Social History   Socioeconomic History  . Marital status: Single    Spouse name: None  . Number of children: None  . Years of education: None  . Highest education level: None  Social  Needs  . Financial resource strain: None  . Food insecurity - worry: None  . Food insecurity - inability: None  . Transportation needs - medical: None  . Transportation needs - non-medical: None  Occupational History  . None  Tobacco Use  . Smoking status: Former Games developermoker  . Smokeless tobacco: Former Engineer, waterUser  Substance and Sexual Activity  . Alcohol use: No    Frequency: Never  . Drug use: No  . Sexual activity: None  Other Topics Concern  . None  Social History Narrative  . None   Additional Social History:                         Sleep: Poor  Appetite:  Fair  Current Medications: Current Facility-Administered Medications  Medication Dose Route Frequency Provider Last Rate Last Dose  . acetaminophen (TYLENOL) tablet 650 mg  650 mg Oral Q6H PRN Pucilowska, Jolanta B, MD   650 mg at 07/12/17 0832  . alum & mag hydroxide-simeth (MAALOX/MYLANTA) 200-200-20 MG/5ML suspension 30 mL  30 mL Oral Q4H PRN Pucilowska, Jolanta B, MD      . atorvastatin (LIPITOR) tablet 10 mg  10 mg Oral q1800 Pucilowska, Jolanta B, MD   10 mg at 07/22/17 1700  . feeding supplement (ENSURE ENLIVE) (ENSURE ENLIVE) liquid 237 mL  237 mL Oral TID BM Pucilowska, Jolanta B, MD   237 mL at 07/22/17 2107  . fluvoxaMINE (LUVOX) tablet 200 mg  200 mg Oral  QHS Pucilowska, Jolanta B, MD   200 mg at 07/22/17 2101  . levothyroxine (SYNTHROID, LEVOTHROID) tablet 100 mcg  100 mcg Oral QAC breakfast Pucilowska, Jolanta B, MD   100 mcg at 07/23/17 0824  . Jeanne Hendricks (ATIVAN) tablet 2 mg  2 mg Oral Q4H PRN Daijon Wenke, Jackquline Denmark, MD   2 mg at 07/23/17 1219  . magnesium hydroxide (MILK OF MAGNESIA) suspension 30 mL  30 mL Oral Daily PRN Pucilowska, Jolanta B, MD   30 mL at 07/14/17 1719  . Jeanne Hendricks (VERSED) injection 2 mg  2 mg Intravenous Once Tressa Maldonado T, MD      . Jeanne Hendricks) injection 4 mg  4 mg Intravenous Once PRN Berdine Addison, MD      . Jeanne Hendricks (SEROQUEL) tablet 200 mg  200 mg Oral QHS Wren Gallaga  T, MD      . Jeanne Hendricks (RESTORIL) capsule 7.5 mg  7.5 mg Oral QHS Pucilowska, Jolanta B, MD   7.5 mg at 07/22/17 2101    Lab Results: No results found for this or any previous visit (from the past 48 hour(s)).  Blood Alcohol level:  Lab Results  Component Value Date   ETH <10 07/03/2017    Metabolic Disorder Labs: Lab Results  Component Value Date   HGBA1C 5.1 07/06/2017   MPG 99.67 07/06/2017   No results found for: PROLACTIN Lab Results  Component Value Date   CHOL 103 07/06/2017   TRIG 112 07/06/2017   HDL 39 (L) 07/06/2017   CHOLHDL 2.6 07/06/2017   VLDL 22 07/06/2017   LDLCALC 42 07/06/2017    Physical Findings: AIMS: Facial and Oral Movements Muscles of Facial Expression: None, normal Lips and Perioral Area: None, normal Jaw: None, normal Tongue: None, normal,Extremity Movements Upper (arms, wrists, hands, fingers): None, normal Lower (legs, knees, ankles, toes): None, normal, Trunk Movements Neck, shoulders, hips: None, normal, Overall Severity Severity of abnormal movements (highest score from questions above): None, normal Incapacitation due to abnormal movements: None, normal Patient's awareness of abnormal movements (rate only patient's report): No Awareness, Dental Status Current problems with teeth and/or dentures?: No Does patient usually wear dentures?: No  CIWA:    COWS:     Musculoskeletal: Strength & Muscle Tone: within normal limits Gait & Station: normal Patient leans: N/A  Psychiatric Specialty Exam: Physical Exam  Nursing note and vitals reviewed. Constitutional: She appears well-developed and well-nourished.  HENT:  Head: Normocephalic and atraumatic.  Eyes: Conjunctivae are normal. Pupils are equal, round, and reactive to light.  Neck: Normal range of motion.  Cardiovascular: Regular rhythm and normal heart sounds.  Respiratory: Effort normal. No respiratory distress.  GI: Soft.  Musculoskeletal: Normal range of motion.   Neurological: She is alert.  Skin: Skin is warm and dry.  Psychiatric: Her mood appears anxious. Her affect is labile and inappropriate. Her speech is rapid and/or pressured and tangential. She is agitated, aggressive and hyperactive. She is not combative. Thought content is delusional. Thought content is not paranoid. Cognition and memory are impaired. She expresses impulsivity and inappropriate judgment.    Review of Systems  Constitutional: Negative.   HENT: Negative.   Eyes: Negative.   Respiratory: Negative.   Cardiovascular: Negative.   Gastrointestinal: Negative.   Musculoskeletal: Negative.   Skin: Negative.   Neurological: Negative.   Psychiatric/Behavioral: Positive for memory loss. Negative for depression, hallucinations, substance abuse and suicidal ideas. The patient is nervous/anxious and has insomnia.     Blood pressure (!) 150/123, pulse 98, temperature  97.6 F (36.4 C), temperature source Oral, resp. rate 18, height 5\' 5"  (1.651 m), weight 64.9 kg (143 lb), SpO2 91 %.Body mass index is 23.8 kg/m.  General Appearance: Casual  Eye Contact:  Good  Speech:  Pressured  Volume:  Increased  Mood:  Angry, Anxious, Dysphoric and Irritable  Affect:  Inappropriate and Labile  Thought Process:  Disorganized  Orientation:  Full (Time, Place, and Person)  Thought Content:  Illogical, Rumination and Tangential  Suicidal Thoughts:  No  Homicidal Thoughts:  No  Memory:  Negative  Judgement:  Poor  Insight:  Lacking  Psychomotor Activity:  Restlessness  Concentration:  Concentration: Poor  Recall:  Poor  Fund of Knowledge:  Fair  Language:  Fair  Akathisia:  No  Handed:  Right  AIMS (if indicated):     Assets:  Desire for Improvement Housing Physical Health Resilience Social Support  ADL's:  Impaired  Cognition:  Impaired,  Mild  Sleep:  Number of Hours: 6.3     Treatment Plan Summary: Daily contact with patient to assess and evaluate symptoms and progress in  treatment, Medication management and Plan For what ever reason the patient is clearly very decompensated today.  She was disruptive to the whole unit today.  Ultimately I had her take 100 mg of Seroquel orally followed by 2 mg of Ativan and finally a 20 mg Geodon shot just to get her to calm down enough to sit in her room quietly.  She was complaining constantly about how she needed "little brown pills" in order to make her "pee".  The closest I can come to figuring out what she is talking about is the senna that she was given earlier in her hospitalization and I will give her that again.  Patient has ECT scheduled tomorrow but I think in the meantime we need to be more aggressive with medicine.  I am adding Seroquel 200 mg at night as well as having PRN medicine.  Jeanne Rasmussen, MD 07/23/2017, 3:03 PM

## 2017-07-23 NOTE — Progress Notes (Signed)
D:Pt denies SI/HI/AVH. Pt. Verbally is able to contract for safety. Pt. Verbalizes she can remain safe while on the unit.Pt is continues to be: intrusive, labile, anxious, tangential, attention-seeking, childlike, and needy upon presentation. Pt.has noComplaints, but contines to frequently asks the same questions over and over again, requiring frequent redirection from staff, despite questions being answered extensively.Pt. Continues to not attend to hygiene and ADls. Pt. Did sleeping good.Pt. Appetite is good.Pt. Reports feeling, "good, that medication made me sleep" today when asked how she's doing during assessment.   A: Q x 15 minute observation checks were completed for safety. Patient was provided with education.Pt. Needs reinforcement on provided education.Patient was given scheduled medications. Patient was encourage to attend groups, participate in unit activities and continue with plan of care.   R:Patient is complaint with medication and unit procedures. Pt. Does not go to groups. NPO maintained after midnight per MD orders.             Precautionary checks every 15 minutes for safety maintained, room free of safety hazards, patient sustains no injury or falls during this shift.

## 2017-07-23 NOTE — [facility] Group Notes (Signed)
[facility] Group Notes:  (Nursing/MHT/Case Management/Adjunct)  Date:  07/23/2017  Time:  8:06 AM  Type of Therapy:  Psychoeducational Skills  Participation Level:  Active  Participation Quality:  Appropriate, Attentive and Sharing  Affect:  Appropriate  Cognitive:  Appropriate  Insight:  Appropriate and Good  Engagement in Group:  Engaged  Modes of Intervention:  Discussion, Socialization and Support  Summary of Progress/Problems:  Jeanne Hendricks 07/23/2017, 8:06 AM

## 2017-07-23 NOTE — Progress Notes (Signed)
D:Pt denies SI/HI/AVH. Pt. Verbally is able to contract for safety. Pt. Verbalizes she can remain safe while on the unit.  Pt is: intrusive, labile, anxious, tangential, attention-seeking, childlike, and needy upon presentation. Pt.has no Complaints, but frequently asks the same questions over and over again, requiring frequent redirection from staff, despite questions being answered extensively. Pt. Continues to not attend to hygiene and ADls. Pt. Reports doing, "better" today then she did yesterday. Pt. Did sleep well last night, improved from the previous night. Pt. Appetite is much more improved.   A: Q x 15 minute observation checks were completed for safety. Patient was provided with education. Pt. Needs reinforcement on provided education. Patient was given scheduled medications. Patient was encourage to attend groups, participate in unit activities and continue with plan of care.   R:Patient is complaint with medication and unit procedures. Pt. Does not go to groups.            Precautionary checks every 15 minutes for safety maintained, room free of safety hazards, patient sustains no injury or falls during this shift.

## 2017-07-23 NOTE — [facility] Group Notes (Signed)
LCSW Group Therapy Note 07/23/2017 1:15pm  Type of Therapy and Topic: Group Therapy: Feelings Around Returning Home & Establishing a Supportive Framework and Supporting Oneself When Supports Not Available  Participation Level: Did Not Attend  Description of Group:  Patients first processed thoughts and feelings about upcoming discharge. These included fears of upcoming changes, lack of change, new living environments, judgements and expectations from others and overall stigma of mental health issues. The group then discussed the definition of a supportive framework, what that looks and feels like, and how do to discern it from an unhealthy non-supportive network. The group identified different types of supports as well as what to do when your family/friends are less than helpful or unavailable  Therapeutic Goals  1. Patient will identify one healthy supportive network that they can use at discharge. 2. Patient will identify one factor of a supportive framework and how to tell it from an unhealthy network. 3. Patient able to identify one coping skill to use when they do not have positive supports from others. 4. Patient will demonstrate ability to communicate their needs through discussion and/or role plays.  Summary of Patient Progress:    Therapeutic Modalities Cognitive Behavioral Therapy Motivational Interviewing   Jeanne Hendricks  CUEBAS-COLON, LCSW 07/23/2017 12:51 PM 

## 2017-07-23 NOTE — Plan of Care (Signed)
Pt. Compliant with medications this evening. Pt. Denies SI/HI. Pt. Verbally is able to contract for safety. Pt. Verbalizes she can remain safe while on the unit. Pt. Reports feeling, "good, that medication made me sleep" today when asked how she's doing during assessment. Pt. Continues to be very intrusive to staff and peers. Pt. Continues to be Very obsessive with questions and will repeat questions, despite sufficient answers to questions. Pt. Reports sleeping good. Pt. Needs reinforcement on provided education. Pt. Does not go to groups. Pt. Eating good.    Progressing Activity: Imbalance in normal sleep/wake cycle will improve 07/23/2017 2331 - Progressing by Lenox PondsStevens, Dessie Delcarlo J, RN Health Behavior/Discharge Planning: Compliance with therapeutic regimen will improve 07/23/2017 2331 - Progressing by Lenox PondsStevens, Jance Siek J, RN Safety: Ability to disclose and discuss suicidal ideas will improve 07/23/2017 2331 - Progressing by Lenox PondsStevens, Olin Gurski J, RN Self-Concept: Ability to verbalize positive feelings about self will improve 07/23/2017 2341 - Progressing by Lenox PondsStevens, Lyn Deemer J, RN 07/23/2017 2331 - Progressing by Lenox PondsStevens, Eudelia Hiltunen J, RN Activity: Sleeping patterns will improve 07/23/2017 2331 - Progressing by Lenox PondsStevens, Helmi Hechavarria J, RN   Spiritual Needs Ability to function at adequate level 07/23/2017 2331 - Not Progressing by Lenox PondsStevens, Malikiah Debarr J, RN   Activity: Interest or engagement in leisure activities will improve 07/23/2017 2331 - Not Progressing by Lenox PondsStevens, Sayge Salvato J, RN   Education: Utilization of techniques to improve thought processes will improve 07/23/2017 2331 - Not Progressing by Lenox PondsStevens, Nyomie Ehrlich J, RN   Education: Knowledge of the prescribed therapeutic regimen will improve 07/23/2017 2331 - Not Progressing by Lenox PondsStevens, Sophie Quiles J, RN   Coping: Ability to cope will improve 07/23/2017 2331 - Not Progressing by Lenox PondsStevens, Alexzandria Massman J, RN   Health Behavior/Discharge Planning: Ability to make decisions will  improve 07/23/2017 2331 - Not Progressing by Lenox PondsStevens, Priyansh Pry J, RN   Role Relationship: Ability to demonstrate positive changes in social behaviors and relationships will improve 07/23/2017 2331 - Not Progressing by Lenox PondsStevens, Adarsh Mundorf J, RN   [facility] Therapeutic Interventions STG-Other Recreation Therapy Goal (Specify) Description Patient will identify 3 positive coping skills to use post discharge x5 days.  07/23/2017 2331 - Not Progressing by Lenox PondsStevens, Ora Mcnatt J, RN

## 2017-07-24 ENCOUNTER — Encounter: Payer: Self-pay | Admitting: *Deleted

## 2017-07-24 ENCOUNTER — Inpatient Hospital Stay: Payer: Medicaid Other | Admitting: Certified Registered Nurse Anesthetist

## 2017-07-24 LAB — GLUCOSE, CAPILLARY
Glucose-Capillary: 110 mg/dL — ABNORMAL HIGH (ref 65–99)
Glucose-Capillary: 108 mg/dL — ABNORMAL HIGH (ref 65–99)

## 2017-07-24 MED ORDER — KETOROLAC TROMETHAMINE 30 MG/ML IJ SOLN
INTRAMUSCULAR | Status: AC
  Administered 2017-07-24: 30 mg via INTRAVENOUS
  Filled 2017-07-24: qty 1

## 2017-07-24 MED ORDER — SODIUM CHLORIDE 0.9 % IV SOLN
500.0000 mL | Freq: Once | INTRAVENOUS | Status: AC
  Administered 2017-07-24: 500 mL via INTRAVENOUS

## 2017-07-24 MED ORDER — HALOPERIDOL LACTATE 5 MG/ML IJ SOLN
INTRAMUSCULAR | Status: DC | PRN
  Administered 2017-07-24: 5 mg via INTRAVENOUS

## 2017-07-24 MED ORDER — METHOHEXITAL SODIUM 100 MG/10ML IV SOSY
PREFILLED_SYRINGE | INTRAVENOUS | Status: DC | PRN
  Administered 2017-07-24: 80 mg via INTRAVENOUS

## 2017-07-24 MED ORDER — SUCCINYLCHOLINE CHLORIDE 20 MG/ML IJ SOLN
INTRAMUSCULAR | Status: DC | PRN
  Administered 2017-07-24: 80 mg via INTRAVENOUS

## 2017-07-24 MED ORDER — ONDANSETRON HCL 4 MG/2ML IJ SOLN
INTRAMUSCULAR | Status: AC
  Administered 2017-07-24: 4 mg via INTRAVENOUS
  Filled 2017-07-24: qty 2

## 2017-07-24 MED ORDER — METHOHEXITAL SODIUM 0.5 G IJ SOLR
INTRAMUSCULAR | Status: AC
  Filled 2017-07-24: qty 500

## 2017-07-24 MED ORDER — HALOPERIDOL LACTATE 5 MG/ML IJ SOLN
INTRAMUSCULAR | Status: AC
  Filled 2017-07-24: qty 1

## 2017-07-24 MED ORDER — QUETIAPINE FUMARATE 200 MG PO TABS
300.0000 mg | ORAL_TABLET | Freq: Every day | ORAL | Status: DC
  Administered 2017-07-24: 21:00:00 300 mg via ORAL
  Filled 2017-07-24: qty 1

## 2017-07-24 MED ORDER — MIDAZOLAM HCL 2 MG/2ML IJ SOLN
INTRAMUSCULAR | Status: DC | PRN
  Administered 2017-07-24: 2 mg via INTRAVENOUS

## 2017-07-24 MED ORDER — MIDAZOLAM HCL 2 MG/2ML IJ SOLN: 4.0000 mg | Freq: Once | INTRAMUSCULAR | Status: DC

## 2017-07-24 MED ORDER — KETOROLAC TROMETHAMINE 30 MG/ML IJ SOLN
30.0000 mg | Freq: Once | INTRAMUSCULAR | Status: AC
  Administered 2017-07-24: 30 mg via INTRAVENOUS

## 2017-07-24 MED ORDER — MIDAZOLAM HCL 2 MG/2ML IJ SOLN
INTRAMUSCULAR | Status: AC
  Filled 2017-07-24: qty 2

## 2017-07-24 MED ORDER — ONDANSETRON HCL 4 MG/2ML IJ SOLN
4.0000 mg | Freq: Once | INTRAMUSCULAR | Status: AC
  Administered 2017-07-24: 4 mg via INTRAVENOUS

## 2017-07-24 MED ORDER — SODIUM CHLORIDE 0.9 % IV SOLN
INTRAVENOUS | Status: DC | PRN
  Administered 2017-07-24: 10:00:00 via INTRAVENOUS

## 2017-07-24 NOTE — Procedures (Signed)
ECT SERVICES Physician's Interval Evaluation & Treatment Note  Patient Identification: Jeanne Hendricks MRN:  [id number] Date of Evaluation:  07/24/2017 TX #: 2  MADRS:   MMSE:   P.E. Findings:  No change to physical exam  Psychiatric Interval Note:  Agitated confused  Subjective:  Patient is a 47 y.o. female seen for evaluation for Electroconvulsive Therapy. Believes she cannot urinate  Treatment Summary:   [x]   Right Unilateral             []  Bilateral   % Energy : 0.3 ms 50%   Impedance: 1480 ohms  Seizure Energy Index: 15,318 V squared  Postictal Suppression Index: 75%  Seizure Concordance Index: 90%  Medications  Pre Shock: Toradol 30 mg Zofran 4 mg Brevital 80 mg succinylcholine 80 mg  Post Shock: Versed 4 mg Haldol 5 mg  Seizure Duration: 33 seconds EMG 42 seconds EEG   Comments: Follow-up in the hospital Monday Wednesday Friday  Lungs:  [x]   Clear to auscultation               []  Other:   Heart:    [x]   Regular rhythm             []  irregular rhythm    [x]   Previous H&P reviewed, patient examined and there are NO CHANGES                 []   Previous H&P reviewed, patient examined and there are changes noted.   Mordecai RasmussenJohn Clapacs, MD 2/18/201910:29 AM

## 2017-07-24 NOTE — [facility] Group Notes (Signed)
LCSW Group Therapy Note   07/24/2017 1:00pm   Type of Therapy and Topic:  Group Therapy:  Overcoming Obstacles   Participation Level:  Did Not Attend   Description of Group:    In this group patients will be encouraged to explore what they see as obstacles to their own wellness and recovery. They will be guided to discuss their thoughts, feelings, and behaviors related to these obstacles. The group will process together ways to cope with barriers, with attention given to specific choices patients can make. Each patient will be challenged to identify changes they are motivated to make in order to overcome their obstacles. This group will be process-oriented, with patients participating in exploration of their own experiences as well as giving and receiving support and challenge from other group members.   Therapeutic Goals: 1. Patient will identify personal and current obstacles as they relate to admission. 2. Patient will identify barriers that currently interfere with their wellness or overcoming obstacles.  3. Patient will identify feelings, thought process and behaviors related to these barriers. 4. Patient will identify two changes they are willing to make to overcome these obstacles:      Summary of Patient Progress      Therapeutic Modalities:   Cognitive Behavioral Therapy Solution Focused Therapy Motivational Interviewing Relapse Prevention Therapy  Glennon MacSara P Seferino Oscar, LCSW 07/24/2017 4:06 PM

## 2017-07-24 NOTE — Anesthesia Post-op Follow-up Note (Signed)
Anesthesia QCDR form completed.        

## 2017-07-24 NOTE — Anesthesia Preprocedure Evaluation (Signed)
Anesthesia Evaluation  Patient identified by MRN, date of birth, ID band Patient awake    Reviewed: Allergy & Precautions, H&P , NPO status , Patient's Chart, lab work & pertinent test results, reviewed documented beta blocker date and time   Airway Mallampati: II   Neck ROM: full    Dental  (+) Poor Dentition   Pulmonary neg pulmonary ROS, former smoker,    Pulmonary exam normal        Cardiovascular hypertension, negative cardio ROS Normal cardiovascular exam Rhythm:regular Rate:Normal     Neuro/Psych negative neurological ROS  negative psych ROS   GI/Hepatic negative GI ROS, Neg liver ROS,   Endo/Other  negative endocrine ROSdiabetesHypothyroidism   Renal/GU negative Renal ROS  negative genitourinary   Musculoskeletal   Abdominal   Peds  Hematology negative hematology ROS (+)   Anesthesia Other Findings Past Medical History: No date: Depression No date: Diabetes mellitus without complication (HCC) No date: Hypertension No date: Thyroid disease History reviewed. No pertinent surgical history. BMI    Body Mass Index:  23.80 kg/m     Reproductive/Obstetrics negative OB ROS                             Anesthesia Physical Anesthesia Plan  ASA: III  Anesthesia Plan: General   Post-op Pain Management:    Induction:   PONV Risk Score and Plan: 3  Airway Management Planned:   Additional Equipment:   Intra-op Plan:   Post-operative Plan:   Informed Consent: I have reviewed the patients History and Physical, chart, labs and discussed the procedure including the risks, benefits and alternatives for the proposed anesthesia with the patient or authorized representative who has indicated his/her understanding and acceptance.   Dental Advisory Given  Plan Discussed with: CRNA  Anesthesia Plan Comments:         Anesthesia Quick Evaluation

## 2017-07-24 NOTE — Progress Notes (Signed)
[facility] MD Progress Note  07/24/2017 4:29 PM Jeanne Hendricks  MRN:  [id number] Subjective: Follow-up for 47 year old woman with bipolar disorder who is now receiving ECT along with other treatment.  Patient was agitated hyperverbal and animated this morning much as she was yesterday.  Additionally she is now focused on the idea that she is unable to urinate.  She has asked multiple times through the day to have a urine catheter placed.  Patient had ECT this morning which had no difficulty associated with that.  I spoke with her this afternoon and spoke with her mother by telephone as well. Principal Problem: Schizoaffective disorder, bipolar type (HCC) Diagnosis:   Patient Active Problem List   Diagnosis Date Noted  . OCD (obsessive compulsive disorder) [F42.9] 07/06/2017  . Schizoaffective disorder, bipolar type (HCC) [F25.0] 07/04/2017  . HTN (hypertension) [I10] 07/04/2017  . Diabetes (HCC) [E11.9] 07/04/2017  . Hypothyroidism [E03.9] 07/04/2017  . Suicidal ideation [R45.851] 07/04/2017   Total Time spent with patient: 30 minutes  Past Psychiatric History: Patient has a history of bipolar disorder and OCD with positive past hospitalizations  Past Medical History:  Past Medical History:  Diagnosis Date  . Depression   . Diabetes mellitus without complication (HCC)   . Hypertension   . Thyroid disease    History reviewed. No pertinent surgical history. Family History: History reviewed. No pertinent family history. Family Psychiatric  History: Unknown Social History:  Social History   Substance and Sexual Activity  Alcohol Use No  . Frequency: Never     Social History   Substance and Sexual Activity  Drug Use No    Social History   Socioeconomic History  . Marital status: Single    Spouse name: None  . Number of children: None  . Years of education: None  . Highest education level: None  Social Needs  . Financial resource strain: None  . Food insecurity - worry: None  .  Food insecurity - inability: None  . Transportation needs - medical: None  . Transportation needs - non-medical: None  Occupational History  . None  Tobacco Use  . Smoking status: Former Games developer  . Smokeless tobacco: Former Engineer, water and Sexual Activity  . Alcohol use: No    Frequency: Never  . Drug use: No  . Sexual activity: None  Other Topics Concern  . None  Social History Narrative  . None   Additional Social History:                         Sleep: Poor  Appetite:  Fair  Current Medications: Current Facility-Administered Medications  Medication Dose Route Frequency Provider Last Rate Last Dose  . acetaminophen (TYLENOL) tablet 650 mg  650 mg Oral Q6H PRN Pucilowska, Jolanta B, MD   650 mg at 07/12/17 0832  . alum & mag hydroxide-simeth (MAALOX/MYLANTA) 200-200-20 MG/5ML suspension 30 mL  30 mL Oral Q4H PRN Pucilowska, Jolanta B, MD      . atorvastatin (LIPITOR) tablet 10 mg  10 mg Oral q1800 Pucilowska, Jolanta B, MD   10 mg at 07/22/17 1700  . feeding supplement (ENSURE ENLIVE) (ENSURE ENLIVE) liquid 237 mL  237 mL Oral TID BM Pucilowska, Jolanta B, MD   237 mL at 07/23/17 2014  . fluvoxaMINE (LUVOX) tablet 200 mg  200 mg Oral QHS Pucilowska, Jolanta B, MD   200 mg at 07/23/17 2116  . levothyroxine (SYNTHROID, LEVOTHROID) tablet 100 mcg  100 mcg  Oral QAC breakfast Pucilowska, Jolanta B, MD   100 mcg at 07/23/17 0824  . LORazepam (ATIVAN) tablet 2 mg  2 mg Oral Q4H PRN Jonee Lamore, Jackquline DenmarkJohn T, MD   2 mg at 07/24/17 1245  . magnesium hydroxide (MILK OF MAGNESIA) suspension 30 mL  30 mL Oral Daily PRN Pucilowska, Jolanta B, MD   30 mL at 07/14/17 1719  . midazolam (VERSED) injection 2 mg  2 mg Intravenous Once Jalah Warmuth T, MD      . midazolam (VERSED) injection 4 mg  4 mg Intravenous Once Draxton Luu T, MD      . ondansetron [facility](ZOFRAN) injection 4 mg  4 mg Intravenous Once PRN Berdine Addisonhomas, Mathai, MD      . QUEtiapine (SEROQUEL) tablet 300 mg  300 mg Oral QHS Detrich Rakestraw,  Emalie Mcwethy T, MD      . temazepam (RESTORIL) capsule 7.5 mg  7.5 mg Oral QHS Pucilowska, Jolanta B, MD   7.5 mg at 07/23/17 2116    Lab Results:  Results for orders placed or performed during the hospital encounter of 07/05/17 (from the past 48 hour(s))  Glucose, capillary     Status: Abnormal   Collection Time: 07/24/17  5:06 AM  Result Value Ref Range   Glucose-Capillary 110 (H) 65 - 99 mg/dL  Glucose, capillary     Status: Abnormal   Collection Time: 07/24/17 10:36 AM  Result Value Ref Range   Glucose-Capillary 108 (H) 65 - 99 mg/dL    Blood Alcohol level:  Lab Results  Component Value Date   ETH <10 07/03/2017    Metabolic Disorder Labs: Lab Results  Component Value Date   HGBA1C 5.1 07/06/2017   MPG 99.67 07/06/2017   No results found for: PROLACTIN Lab Results  Component Value Date   CHOL 103 07/06/2017   TRIG 112 07/06/2017   HDL 39 (L) 07/06/2017   CHOLHDL 2.6 07/06/2017   VLDL 22 07/06/2017   LDLCALC 42 07/06/2017    Physical Findings: AIMS: Facial and Oral Movements Muscles of Facial Expression: None, normal Lips and Perioral Area: None, normal Jaw: None, normal Tongue: None, normal,Extremity Movements Upper (arms, wrists, hands, fingers): None, normal Lower (legs, knees, ankles, toes): None, normal, Trunk Movements Neck, shoulders, hips: None, normal, Overall Severity Severity of abnormal movements (highest score from questions above): None, normal Incapacitation due to abnormal movements: None, normal Patient's awareness of abnormal movements (rate only patient's report): No Awareness, Dental Status Current problems with teeth and/or dentures?: No Does patient usually wear dentures?: No  CIWA:    COWS:     Musculoskeletal: Strength & Muscle Tone: within normal limits Gait & Station: normal Patient leans: N/A  Psychiatric Specialty Exam: Physical Exam  Nursing note and vitals reviewed. Constitutional: She appears well-developed and well-nourished.   HENT:  Head: Normocephalic and atraumatic.  Eyes: Conjunctivae are normal. Pupils are equal, round, and reactive to light.  Neck: Normal range of motion.  Cardiovascular: Regular rhythm and normal heart sounds.  Respiratory: Effort normal. No respiratory distress.  GI: Soft.  Musculoskeletal: Normal range of motion.  Neurological: She is alert.  Skin: Skin is warm and dry.  Psychiatric: Her mood appears anxious. Her affect is labile. Her speech is rapid and/or pressured and tangential. She is agitated. She is not aggressive. Cognition and memory are impaired. She expresses impulsivity. She expresses no homicidal and no suicidal ideation.    Review of Systems  Constitutional: Negative.   HENT: Negative.   Eyes: Negative.   Respiratory:  Negative.   Cardiovascular: Negative.   Gastrointestinal: Negative.   Genitourinary: Positive for dysuria.  Musculoskeletal: Negative.   Skin: Negative.   Neurological: Negative.   Psychiatric/Behavioral: Positive for memory loss. Negative for depression, hallucinations, substance abuse and suicidal ideas. The patient is nervous/anxious and has insomnia.     Blood pressure 112/86, pulse 100, temperature 98.6 F (37 C), temperature source Oral, resp. rate 18, height 5\' 5"  (1.651 m), weight 64.9 kg (143 lb), SpO2 (!) 83 %.Body mass index is 23.8 kg/m.  General Appearance: Casual  Eye Contact:  Fair  Speech:  Pressured  Volume:  Increased  Mood:  Anxious  Affect:  Inappropriate and Labile  Thought Process:  Disorganized  Orientation:  Full (Time, Place, and Person)  Thought Content:  Illogical, Rumination and Tangential  Suicidal Thoughts:  No  Homicidal Thoughts:  No  Memory:  Immediate;   Fair Recent;   Fair Remote;   Fair  Judgement:  Impaired  Insight:  Shallow  Psychomotor Activity:  Restlessness  Concentration:  Concentration: Poor  Recall:  [employer] of Knowledge:  Fair  Language:  Fair  Akathisia:  No  Handed:  Right  AIMS (if  indicated):     Assets:  Desire for Improvement Housing Physical Health Resilience Social Support  ADL's:  Impaired  Cognition:  Impaired,  Mild  Sleep:  Number of Hours: 7.3     Treatment Plan Summary: Daily contact with patient to assess and evaluate symptoms and progress in treatment, Medication management and Plan Patient with bipolar disorder mania and OCD.  ECT treatment being tolerated fine.  Started her on Seroquel over the weekend to help with the agitation.  Increasing dose today to 300 mg.  Spoke with the mother today and updated her on the treatment plan which would include a likely continued length of stay in the neighborhood of a week.  As far as the patient's complaints about not being able to urinate I spoke with nursing about it.  It really does not seem to be likely to be true given her clinical condition.  Nursing reports that the patient will at other times say that she has successfully been able to urinate.  The whole thing sounds are odd and sort of psychotic.  We will however try and keep an eye on it.  If it looks like she is really in some discomfort or the complaints continue we may have to intervene.  No other change to treatment her medicine today.  Mordecai Rasmussen, MD 07/24/2017, 4:29 PM

## 2017-07-24 NOTE — H&P (Signed)
Jeanne Hendricks is an 47 y.o. female.   Chief Complaint: Continues to be agitated.  Complains of not being able to urinate.  Confused.  Mood upset. HPI: Bipolar disorder  Past Medical History:  Diagnosis Date  . Depression   . Diabetes mellitus without complication (HCC)   . Hypertension   . Thyroid disease     History reviewed. No pertinent surgical history.  History reviewed. No pertinent family history. Social History:  reports that she has quit smoking. She has quit using smokeless tobacco. She reports that she does not drink alcohol or use drugs.  Allergies:  Allergies  Allergen Reactions  . Bactrim [Sulfamethoxazole-Trimethoprim]   . Compazine [Prochlorperazine Edisylate]     Medications Prior to Admission  Medication Sig Dispense Refill  . docusate sodium (COLACE) 100 MG capsule Take 100 mg by mouth daily as needed for mild constipation.    Marland Kitchen. levothyroxine (SYNTHROID, LEVOTHROID) 50 MCG tablet Take 50 mcg by mouth daily before breakfast.    . mirtazapine (REMERON SOL-TAB) 30 MG disintegrating tablet Take 30 mg by mouth at bedtime.    Marland Kitchen. OLANZapine (ZYPREXA) 20 MG tablet Take 20 mg by mouth at bedtime.    . pantoprazole (PROTONIX) 40 MG tablet Take 40 mg by mouth daily.    . QUEtiapine (SEROQUEL) 25 MG tablet Take 25 mg by mouth 2 (two) times daily as needed (panic attacks).    . QUEtiapine (SEROQUEL) 400 MG tablet Take 400 mg by mouth at bedtime.    Marland Kitchen. QUEtiapine (SEROQUEL) 50 MG tablet Take 50 mg by mouth 2 (two) times daily as needed (panic attacks).    Marland Kitchen. atorvastatin (LIPITOR) 10 MG tablet Take 10 mg by mouth at bedtime.    . diclofenac (VOLTAREN) 75 MG EC tablet Take 75 mg by mouth 2 (two) times daily.    Marland Kitchen. losartan (COZAAR) 50 MG tablet Take 50 mg by mouth daily after breakfast.    . metFORMIN (GLUCOPHAGE) 500 MG tablet Take 500 mg by mouth daily before breakfast.    . propranolol (INDERAL) 20 MG tablet Take 20 mg by mouth 2 (two) times daily.      Results for orders  placed or performed during the hospital encounter of 07/05/17 (from the past 48 hour(s))  Glucose, capillary     Status: Abnormal   Collection Time: 07/24/17  5:06 AM  Result Value Ref Range   Glucose-Capillary 110 (H) 65 - 99 mg/dL   No results found.  Review of Systems  Constitutional: Negative.   HENT: Negative.   Eyes: Negative.   Respiratory: Negative.   Cardiovascular: Negative.   Gastrointestinal: Negative.   Musculoskeletal: Negative.   Skin: Negative.   Neurological: Negative.   Psychiatric/Behavioral: Negative for depression, hallucinations, memory loss, substance abuse and suicidal ideas. The patient is nervous/anxious and has insomnia.     Blood pressure 122/89, pulse 92, temperature 97.9 F (36.6 C), temperature source Oral, resp. rate (!) 8, height 5\' 5"  (1.651 m), weight 64.9 kg (143 lb), SpO2 99 %. Physical Exam  Nursing note and vitals reviewed. Constitutional: She appears well-developed and well-nourished.  HENT:  Head: Normocephalic and atraumatic.  Eyes: Conjunctivae are normal. Pupils are equal, round, and reactive to light.  Neck: Normal range of motion.  Cardiovascular: Regular rhythm and normal heart sounds.  Respiratory: Effort normal. No respiratory distress.  GI: Soft.  Musculoskeletal: Normal range of motion.  Neurological: She is alert.  Skin: Skin is warm and dry.  Psychiatric: Her mood appears anxious.  Her affect is labile and inappropriate. Her speech is rapid and/or pressured. She is agitated. Thought content is delusional. Cognition and memory are impaired. She expresses impulsivity.     Assessment/Plan Continue ECT treatment along with medication management  Mordecai Rasmussen, MD 07/24/2017, 10:26 AM

## 2017-07-24 NOTE — Anesthesia Postprocedure Evaluation (Signed)
Anesthesia Post Note  Patient: Jeanne Hendricks  Procedure(s) Performed: ECT TX  Patient location during evaluation: PACU Anesthesia Type: General Level of consciousness: awake and alert Pain management: pain level controlled Vital Signs Assessment: post-procedure vital signs reviewed and stable Respiratory status: spontaneous breathing, nonlabored ventilation, respiratory function stable and patient connected to nasal cannula oxygen Cardiovascular status: blood pressure returned to baseline and stable Postop Assessment: no apparent nausea or vomiting Anesthetic complications: no     Last Vitals:  Vitals:   07/24/17 1100 07/24/17 1158  BP: 117/86 112/86  Pulse: (!) 110 100  Resp:  18  Temp:  37 C  SpO2: (!) 83%     Last Pain:  Vitals:   07/24/17 1158  TempSrc: Oral  PainSc:                  Yevette EdwardsJames G Adams

## 2017-07-24 NOTE — Plan of Care (Signed)
Patient is alert and oriented today. Patient denies SI, HI and AVH. This morning patient apologized for her behavior yesterday stating," I am ashamed of my self for acting like that." Patient received ECT today. Patient was able to answer all questions post ECT. Patient was able to urinate after procedure. Patient continues to have obsessions on defecating and urinating. Patient asked for an ativan to help with anxiety. Patient also asked,"Will the doctor give me more shots, I slept all night.   Patient has not been up at the nurses station as much today or acting out like she did yesterday. After ECT patient seems to have more understanding of boundaries and is not as impulsive and intrusive as the day before.  Patient has been in her room most of the day resting.  Spiritual Needs Ability to function at adequate level 07/24/2017 1516 - Progressing by Leamon ArntPowell, Teressa Mcglocklin K, RN   Activity: Interest or engagement in leisure activities will improve 07/24/2017 1516 - Not Progressing by Leamon ArntPowell, Creighton Longley K, RN Imbalance in normal sleep/wake cycle will improve 07/24/2017 1516 - Progressing by Leamon ArntPowell, Denece Shearer K, RN   Education: Utilization of techniques to improve thought processes will improve 07/24/2017 1516 - Not Progressing by Leamon ArntPowell, Ardel Jagger K, RN Knowledge of the prescribed therapeutic regimen will improve 07/24/2017 1516 - Not Progressing by Leamon ArntPowell, Amarian Botero K, RN   Coping: Ability to cope will improve 07/24/2017 1516 - Not Progressing by Leamon ArntPowell, Camora Tremain K, RN Ability to verbalize feelings will improve 07/24/2017 1516 - Progressing by Leamon ArntPowell, Lisel Siegrist K, RN  Patients vitals have been within normal limits. Nurse will continue to monitor.

## 2017-07-24 NOTE — Transfer of Care (Signed)
Immediate Anesthesia Transfer of Care Note  Patient: Jeanne Hendricks  Procedure(s) Performed: ECT TX  Patient Location: PACU  Anesthesia Type:General  Level of Consciousness: awake and alert   Airway & Oxygen Therapy: Patient Spontanous Breathing and Patient connected to face mask oxygen  Post-op Assessment: Report given to RN and Post -op Vital signs reviewed and stable  Post vital signs: Reviewed and stable  Last Vitals:  Vitals:   07/24/17 0919 07/24/17 0949  BP: 129/86 122/89  Pulse: 99 92  Resp: 18 (!) 8  Temp: 37 C 36.6 C  SpO2: 99%     Last Pain:  Vitals:   07/24/17 0949  TempSrc: Oral  PainSc:          Complications: No apparent anesthesia complications

## 2017-07-24 NOTE — Anesthesia Procedure Notes (Signed)
Performed by: Jannifer Fischler, CRNA Pre-anesthesia Checklist: Patient identified, Patient being monitored, Timeout performed, Emergency Drugs available and Suction available Patient Re-evaluated:Patient Re-evaluated prior to induction Oxygen Delivery Method: Circle system utilized Preoxygenation: Pre-oxygenation with 100% oxygen Ventilation: Mask ventilation without difficulty Airway Equipment and Method: Bite block Dental Injury: Teeth and Oropharynx as per pre-operative assessment        

## 2017-07-25 ENCOUNTER — Other Ambulatory Visit: Payer: Self-pay | Admitting: Psychiatry

## 2017-07-25 MED ORDER — QUETIAPINE FUMARATE 200 MG PO TABS
400.0000 mg | ORAL_TABLET | Freq: Every day | ORAL | Status: DC
  Administered 2017-07-25 – 2017-07-27 (×3): 400 mg via ORAL
  Filled 2017-07-25 (×2): qty 2
  Filled 2017-07-25: qty 4
  Filled 2017-07-25: qty 2

## 2017-07-25 NOTE — B[facility] Group Notes (Signed)
07/25/2017 1PM  Type of Therapy/Topic:  Group Therapy:  Feelings about Diagnosis  Participation Level:  Active   Description of Group:   This group will allow patients to explore their thoughts and feelings about diagnoses they have received. Patients will be guided to explore their level of understanding and acceptance of these diagnoses. Facilitator will encourage patients to process their thoughts and feelings about the reactions of others to their diagnosis and will guide patients in identifying ways to discuss their diagnosis with significant others in their lives. This group will be process-oriented, with patients participating in exploration of their own experiences, giving and receiving support, and processing challenge from other group members.   Therapeutic Goals: 1. Patient will demonstrate understanding of diagnosis as evidenced by identifying two or more symptoms of the disorder 2. Patient will be able to express two feelings regarding the diagnosis 3. Patient will demonstrate their ability to communicate their needs through discussion and/or role play  Summary of Patient Progress: Actively and appropriately engaged in the group. Patient was able to provide support and validation to other group members.Patient practiced active listening when interacting with the facilitator and other group members Patient in still in the process of obtaining treatment goals. Jeanne Hendricks says "I need to learn to communicate better. I have 4 kids at home that I have to take care of."      Therapeutic Modalities:   Cognitive Behavioral Therapy Brief Therapy Feelings Identification    Jeanne ShearsCassandra  Mariah Gerstenberger, LCSW 07/25/2017 2:55 PM

## 2017-07-25 NOTE — Progress Notes (Signed)
[facility] MD Progress Note  07/25/2017 6:17 PM Jeanne Hendricks  MRN:  [id number] Subjective: Follow-up note 47 year old woman with bipolar disorder.  Patient is showing partial improvement but remains very intrusive.  Constantly asking for things on the unit.  Asks the same question multiple times a day.  Not violent or aggressive however. Principal Problem: Schizoaffective disorder, bipolar type (HCC) Diagnosis:   Patient Active Problem List   Diagnosis Date Noted  . OCD (obsessive compulsive disorder) [F42.9] 07/06/2017  . Schizoaffective disorder, bipolar type (HCC) [F25.0] 07/04/2017  . HTN (hypertension) [I10] 07/04/2017  . Diabetes (HCC) [E11.9] 07/04/2017  . Hypothyroidism [E03.9] 07/04/2017  . Suicidal ideation [R45.851] 07/04/2017   Total Time spent with patient: 20 minutes  Past Psychiatric History: Patient has a history of bipolar disorder  Past Medical History:  Past Medical History:  Diagnosis Date  . Depression   . Diabetes mellitus without complication (HCC)   . Hypertension   . Thyroid disease    History reviewed. No pertinent surgical history. Family History: History reviewed. No pertinent family history. Family Psychiatric  History: Unknown Social History:  Social History   Substance and Sexual Activity  Alcohol Use No  . Frequency: Never     Social History   Substance and Sexual Activity  Drug Use No    Social History   Socioeconomic History  . Marital status: Single    Spouse name: None  . Number of children: None  . Years of education: None  . Highest education level: None  Social Needs  . Financial resource strain: None  . Food insecurity - worry: None  . Food insecurity - inability: None  . Transportation needs - medical: None  . Transportation needs - non-medical: None  Occupational History  . None  Tobacco Use  . Smoking status: Former Games developer  . Smokeless tobacco: Former Engineer, water and Sexual Activity  . Alcohol use: No   Frequency: Never  . Drug use: No  . Sexual activity: None  Other Topics Concern  . None  Social History Narrative  . None   Additional Social History:                         Sleep: Fair  Appetite:  Fair  Current Medications: Current Facility-Administered Medications  Medication Dose Route Frequency Provider Last Rate Last Dose  . acetaminophen (TYLENOL) tablet 650 mg  650 mg Oral Q6H PRN Pucilowska, Jolanta B, MD   650 mg at 07/12/17 0832  . alum & mag hydroxide-simeth (MAALOX/MYLANTA) 200-200-20 MG/5ML suspension 30 mL  30 mL Oral Q4H PRN Pucilowska, Jolanta B, MD      . atorvastatin (LIPITOR) tablet 10 mg  10 mg Oral q1800 Pucilowska, Jolanta B, MD   10 mg at 07/25/17 1726  . feeding supplement (ENSURE ENLIVE) (ENSURE ENLIVE) liquid 237 mL  237 mL Oral TID BM Pucilowska, Jolanta B, MD   237 mL at 07/25/17 1045  . fluvoxaMINE (LUVOX) tablet 200 mg  200 mg Oral QHS Pucilowska, Jolanta B, MD   200 mg at 07/24/17 2103  . levothyroxine (SYNTHROID, LEVOTHROID) tablet 100 mcg  100 mcg Oral QAC breakfast Pucilowska, Jolanta B, MD   100 mcg at 07/25/17 0914  . LORazepam (ATIVAN) tablet 2 mg  2 mg Oral Q4H PRN Clapacs, Jackquline Denmark, MD   2 mg at 07/25/17 1532  . magnesium hydroxide (MILK OF MAGNESIA) suspension 30 mL  30 mL Oral Daily PRN Pucilowska, Jolanta B,  MD   30 mL at 07/14/17 1719  . midazolam (VERSED) injection 2 mg  2 mg Intravenous Once Clapacs, John T, MD      . midazolam (VERSED) injection 4 mg  4 mg Intravenous Once Clapacs, John T, MD      . ondansetron [facility]) injection 4 mg  4 mg Intravenous Once PRN Berdine Addison, MD      . QUEtiapine (SEROQUEL) tablet 400 mg  400 mg Oral QHS Clapacs, John T, MD      . temazepam (RESTORIL) capsule 7.5 mg  7.5 mg Oral QHS Pucilowska, Jolanta B, MD   7.5 mg at 07/24/17 2103    Lab Results:  Results for orders placed or performed during the hospital encounter of 07/05/17 (from the past 48 hour(s))  Glucose, capillary     Status:  Abnormal   Collection Time: 07/24/17  5:06 AM  Result Value Ref Range   Glucose-Capillary 110 (H) 65 - 99 mg/dL  Glucose, capillary     Status: Abnormal   Collection Time: 07/24/17 10:36 AM  Result Value Ref Range   Glucose-Capillary 108 (H) 65 - 99 mg/dL    Blood Alcohol level:  Lab Results  Component Value Date   ETH <10 07/03/2017    Metabolic Disorder Labs: Lab Results  Component Value Date   HGBA1C 5.1 07/06/2017   MPG 99.67 07/06/2017   No results found for: PROLACTIN Lab Results  Component Value Date   CHOL 103 07/06/2017   TRIG 112 07/06/2017   HDL 39 (L) 07/06/2017   CHOLHDL 2.6 07/06/2017   VLDL 22 07/06/2017   LDLCALC 42 07/06/2017    Physical Findings: AIMS: Facial and Oral Movements Muscles of Facial Expression: None, normal Lips and Perioral Area: None, normal Jaw: None, normal Tongue: None, normal,Extremity Movements Upper (arms, wrists, hands, fingers): None, normal Lower (legs, knees, ankles, toes): None, normal, Trunk Movements Neck, shoulders, hips: None, normal, Overall Severity Severity of abnormal movements (highest score from questions above): None, normal Incapacitation due to abnormal movements: None, normal Patient's awareness of abnormal movements (rate only patient's report): No Awareness, Dental Status Current problems with teeth and/or dentures?: No Does patient usually wear dentures?: No  CIWA:    COWS:     Musculoskeletal: Strength & Muscle Tone: within normal limits Gait & Station: normal Patient leans: N/A  Psychiatric Specialty Exam: Physical Exam  Constitutional: She appears well-developed and well-nourished.  HENT:  Head: Normocephalic and atraumatic.  Eyes: Conjunctivae are normal. Pupils are equal, round, and reactive to light.  Neck: Normal range of motion.  Cardiovascular: Normal heart sounds.  Respiratory: Effort normal.  GI: Soft.  Musculoskeletal: Normal range of motion.  Neurological: She is alert.  Skin:  Skin is warm and dry.  Psychiatric: Her mood appears anxious. Her speech is delayed and tangential. She is agitated. She is not aggressive. Thought content is paranoid. Cognition and memory are impaired. She expresses impulsivity. She expresses no homicidal and no suicidal ideation.    Review of Systems  Constitutional: Negative.   HENT: Negative.   Eyes: Negative.   Respiratory: Negative.   Cardiovascular: Negative.   Gastrointestinal: Negative.   Musculoskeletal: Negative.   Skin: Negative.   Neurological: Negative.   Psychiatric/Behavioral: Positive for depression and memory loss. Negative for hallucinations, substance abuse and suicidal ideas. The patient is nervous/anxious and has insomnia.     Blood pressure 117/84, pulse 99, temperature 98.2 F (36.8 C), temperature source Oral, resp. rate 18, height 5\' 5"  (1.651 m),  weight 64.9 kg (143 lb), SpO2 (!) 83 %.Body mass index is 23.8 kg/m.  General Appearance: Casual  Eye Contact:  Good  Speech:  Blocked  Volume:  Increased  Mood:  Anxious and Depressed  Affect:  Congruent and Constricted  Thought Process:  Disorganized  Orientation:  Full (Time, Place, and Person)  Thought Content:  Illogical, Rumination and Tangential  Suicidal Thoughts:  No  Homicidal Thoughts:  No  Memory:  Immediate;   Fair Recent;   Fair Remote;   Fair  Judgement:  Poor  Insight:  Lacking  Psychomotor Activity:  Restlessness  Concentration:  Concentration: Poor  Recall:  F[employer]Fair  Fund of Knowledge:  Fair  Language:  Fair  Akathisia:  No  Handed:  Right  AIMS (if indicated):     Assets:  Desire for Improvement Housing Physical Health Resilience  ADL's:  Intact  Cognition:  Impaired,  Mild  Sleep:  Number of Hours: 7.3     Treatment Plan Summary: Daily contact with patient to assess and evaluate symptoms and progress in treatment, Medication management and Plan 47 year old woman with a history of bipolar disorder.  Slight improvement.  Plan is  to continue ECT and also the Seroquel with dose increased to 400 mg tonight.  Reviewed plan with patient.  N.p.o. after midnight tonight.  With her mother yesterday and updated her on the plan.  Mordecai RasmussenJohn Clapacs, MD 07/25/2017, 6:17 PM

## 2017-07-25 NOTE — Progress Notes (Signed)
Patient denies SI/HI/AVH. Patient verbally contracts for safety. Patient is frequently at nurses station asking multiple questions. Patient is frequently returning to nurses station asking the same questions multiple times. Patient is redirected.

## 2017-07-25 NOTE — Plan of Care (Signed)
  Progressing Spiritual Needs Ability to function at adequate level 07/25/2017 1059 - Progressing by Elige Radonobb, Zaven Klemens B, RN Note Improved hygiene, up to dayroom for meals, up for medications 07/25/2017 1055 - Progressing by Elige Radonobb, Patirica Longshore B, RN Education: Utilization of techniques to improve thought processes will improve 07/25/2017 1055 - Progressing by Elige Radonobb, Lavon Bothwell B, RN Knowledge of the prescribed therapeutic regimen will improve 07/25/2017 1059 - Progressing by Elige Radonobb, Tamas Suen B, RN 07/25/2017 1055 - Progressing by Elige Radonobb, Mysty Kielty B, RN Coping: Ability to verbalize feelings will improve 07/25/2017 1059 - Progressing by Elige Radonobb, Toussaint Golson B, RN Note Verbalizes feelings without any difficulty 07/25/2017 1055 - Progressing by Elige Radonobb, Daphney Hopke B, RN Health Behavior/Discharge Planning: Compliance with therapeutic regimen will improve 07/25/2017 1059 - Progressing by Elige Radonobb, Makye Radle B, RN Note Medication compliant although no group attendance 07/25/2017 1055 - Progressing by Elige Radonobb, Breezy Hertenstein B, RN Role Relationship: Ability to demonstrate positive changes in social behaviors and relationships will improve 07/25/2017 1059 - Progressing by Elige Radonobb, Kenidy Crossland B, RN Note Patient is a little more focused.  Frequents nurses station more.  Does not perseverate on things as much  07/25/2017 1055 - Progressing by Elige Radonobb, Makyia Erxleben B, RN Safety: Ability to disclose and discuss suicidal ideas will improve 07/25/2017 1059 - Progressing by Elige Radonobb, Marce Charlesworth B, RN Note Denies SI or any thoughts of self harm 07/25/2017 1055 - Progressing by Elige Radonobb, Ellinor Test B, RN   Not Progressing Activity: Interest or engagement in leisure activities will improve 07/25/2017 1059 by Elige Radonobb, Alyvia Derk B, RN Note States that she cannot attend groups due to her anxiety 07/25/2017 1055 - Not Progressing by Elige Radonobb, Nate Common B, RN Self-Concept: Ability to verbalize positive feelings about self will improve 07/25/2017 1059 - Not Progressing by Elige Radonobb, Shawnelle Spoerl B,  RN Note No positive feelings verbalized about self

## 2017-07-25 NOTE — BHH Group Notes (Signed)
  07/25/2017  Time: 0900  Type of Therapy and Topic:  Group Therapy:  Setting Goals Participation Level:  Did Not Attend  Description of Group: In this process group, patients discussed using strengths to work toward goals and address challenges.  Patients identified two positive things about themselves and one goal they were working on.  Patients were given the opportunity to share openly and support each other's plan for self-empowerment.  The group discussed the value of gratitude and were encouraged to have a daily reflection of positive characteristics or circumstances.  Patients were encouraged to identify a plan to utilize their strengths to work on current challenges and goals.  Therapeutic Goals 1. Patient will verbalize personal strengths/positive qualities and relate how these can assist with achieving desired personal goals 2. Patients will verbalize affirmation of peers plans for personal change and goal setting 3. Patients will explore the value of gratitude and positive focus as related to successful achievement of goals 4. Patients will verbalize a plan for regular reinforcement of personal positive qualities and circumstances.  Summary of Patient Progress: Pt was invited to attend group but chose not to attend. CSW will continue to encourage pt to attend group throughout their admission.    Therapeutic Modalities Cognitive Behavioral Therapy Motivational Interviewing  Heidi DachKelsey Ritisha Deitrick, MSW, LCSW 07/25/2017 9:48 AM

## 2017-07-25 NOTE — Progress Notes (Signed)
Patient ID: Jeanne Hendricks, female   DOB: [birth year], 47 y.o.   MRN: [id number] CSW received call from [facility] staff Bonita QuinLinda saying that they would not be able to process pt referral unless is was [facility], she said Pts case should be handled by Luther RedoAmy Shepherd [phone].  CSW called Ms. Shepherd, discussed Pt's case and how referral had been made. She agreed to review chart and while on the phone says she does not believe she meets criteria but will meet with her to at least do Medicaid application.  CSW agreed to meet her.  She sent list of criteria for servant center referral.  CSW reviewed and we have differing opinions on Pt's meeting criteria.  She declined to send Servant center referral.  Pt remains intrusive, disorganized, and totally unable to follow boundaries established for her by staff.  Jake SharkSara Shams Fill, LCSW

## 2017-07-25 NOTE — BHH Group Notes (Signed)
[facility] Group Notes:  (Nursing/MHT/Case Management/Adjunct)  Date:  07/25/2017  Time:  9:48 PM  Type of Therapy:    Participation Level:     Mayra NeerJackie L Averil Digman 07/25/2017, 9:48 PM

## 2017-07-26 ENCOUNTER — Inpatient Hospital Stay: Payer: Medicaid Other | Admitting: Anesthesiology

## 2017-07-26 LAB — GLUCOSE, CAPILLARY: GLUCOSE-CAPILLARY: 84 mg/dL (ref 65–99)

## 2017-07-26 MED ORDER — ONDANSETRON HCL 4 MG/2ML IJ SOLN
4.0000 mg | Freq: Once | INTRAMUSCULAR | Status: AC
  Administered 2017-07-26: 4 mg via INTRAVENOUS

## 2017-07-26 MED ORDER — ONDANSETRON HCL 4 MG/2ML IJ SOLN
INTRAMUSCULAR | Status: AC
  Filled 2017-07-26: qty 2

## 2017-07-26 MED ORDER — HALOPERIDOL LACTATE 5 MG/ML IJ SOLN
INTRAMUSCULAR | Status: AC
  Filled 2017-07-26: qty 1

## 2017-07-26 MED ORDER — KETOROLAC TROMETHAMINE 30 MG/ML IJ SOLN
30.0000 mg | Freq: Once | INTRAMUSCULAR | Status: AC
  Administered 2017-07-26: 30 mg via INTRAVENOUS

## 2017-07-26 MED ORDER — SODIUM CHLORIDE 0.9 % IV SOLN
500.0000 mL | Freq: Once | INTRAVENOUS | Status: AC
  Administered 2017-07-26: 500 mL via INTRAVENOUS

## 2017-07-26 MED ORDER — METHOHEXITAL SODIUM 100 MG/10ML IV SOSY
PREFILLED_SYRINGE | INTRAVENOUS | Status: DC | PRN
  Administered 2017-07-26: 80 mg via INTRAVENOUS

## 2017-07-26 MED ORDER — HALOPERIDOL LACTATE 5 MG/ML IJ SOLN
INTRAMUSCULAR | Status: DC | PRN
  Administered 2017-07-26: 5 mg via INTRAVENOUS

## 2017-07-26 MED ORDER — SUCCINYLCHOLINE CHLORIDE 200 MG/10ML IV SOSY
PREFILLED_SYRINGE | INTRAVENOUS | Status: DC | PRN
  Administered 2017-07-26: 80 mg via INTRAVENOUS

## 2017-07-26 MED ORDER — MIDAZOLAM HCL 2 MG/2ML IJ SOLN
INTRAMUSCULAR | Status: DC | PRN
  Administered 2017-07-26: 2 mg via INTRAVENOUS

## 2017-07-26 MED ORDER — KETOROLAC TROMETHAMINE 30 MG/ML IJ SOLN
INTRAMUSCULAR | Status: AC
  Filled 2017-07-26: qty 1

## 2017-07-26 MED ORDER — METHOHEXITAL SODIUM 100 MG/10ML IV SOSY: PREFILLED_SYRINGE | INTRAVENOUS | Status: DC | PRN

## 2017-07-26 MED ORDER — MIDAZOLAM HCL 2 MG/2ML IJ SOLN
INTRAMUSCULAR | Status: AC
  Filled 2017-07-26: qty 2

## 2017-07-26 MED ORDER — MIDAZOLAM HCL 2 MG/2ML IJ SOLN: 4.0000 mg | Freq: Once | INTRAMUSCULAR | Status: DC

## 2017-07-26 MED ORDER — SUCCINYLCHOLINE CHLORIDE 20 MG/ML IJ SOLN
INTRAMUSCULAR | Status: AC
  Filled 2017-07-26: qty 1

## 2017-07-26 MED ORDER — SODIUM CHLORIDE 0.9 % IV SOLN
INTRAVENOUS | Status: DC | PRN
  Administered 2017-07-26: 09:00:00 via INTRAVENOUS

## 2017-07-26 NOTE — Plan of Care (Signed)
Patient is safe and free from injury.  Denies suicidal thoughts and ideation.

## 2017-07-26 NOTE — Anesthesia Postprocedure Evaluation (Signed)
Anesthesia Post Note  Patient: Jeanne Hendricks  Procedure(s) Performed: ECT TX  Patient location during evaluation: PACU Anesthesia Type: General Level of consciousness: awake and alert Pain management: pain level controlled Vital Signs Assessment: post-procedure vital signs reviewed and stable Respiratory status: spontaneous breathing, nonlabored ventilation, respiratory function stable and patient connected to nasal cannula oxygen Cardiovascular status: blood pressure returned to baseline and stable Postop Assessment: no apparent nausea or vomiting Anesthetic complications: no     Last Vitals:  Vitals:   07/26/17 1118 07/26/17 1500  BP: 129/88 127/77  Pulse: 100 95  Resp:  16  Temp: 36.6 C 37.4 C  SpO2: 96% 100%    Last Pain:  Vitals:   07/26/17 1500  TempSrc: Oral  PainSc:                  Yevette EdwardsJames G Amontae Ng

## 2017-07-26 NOTE — Transfer of Care (Signed)
Immediate Anesthesia Transfer of Care Note  Patient: Jeanne Hendricks  Procedure(s) Performed: ECT TX  Patient Location: PACU  Anesthesia Type:General  Level of Consciousness: sedated  Airway & Oxygen Therapy: Patient Spontanous Breathing and Patient connected to face mask oxygen  Post-op Assessment: Report given to RN and Post -op Vital signs reviewed and stable  Post vital signs: Reviewed and stable  Last Vitals:  Vitals:   07/26/17 0600 07/26/17 1013  BP: 117/76 117/78  Pulse: 96 96  Resp: 18   Temp: 36.6 C 36.6 C  SpO2: 100% 98%    Last Pain:  Vitals:   07/26/17 1013  TempSrc: Oral  PainSc:          Complications: No apparent anesthesia complications

## 2017-07-26 NOTE — Plan of Care (Signed)
  Safety: Ability to disclose and discuss suicidal ideas will improve 07/26/2017 2325 - Progressing by Delos HaringPhillips, Britanee Vanblarcom A, RN Note Pt passive SI-contracts for safety

## 2017-07-26 NOTE — Anesthesia Procedure Notes (Signed)
Date/Time: 07/26/2017 10:49 AM Performed by: Lily KocherPeralta, Adell Panek, CRNA Pre-anesthesia Checklist: Patient identified, Emergency Drugs available, Suction available and Patient being monitored Patient Re-evaluated:Patient Re-evaluated prior to induction Oxygen Delivery Method: Circle system utilized Preoxygenation: Pre-oxygenation with 100% oxygen Induction Type: IV induction Ventilation: Mask ventilation without difficulty and Mask ventilation throughout procedure Airway Equipment and Method: Bite block Placement Confirmation: positive ETCO2 Dental Injury: Teeth and Oropharynx as per pre-operative assessment

## 2017-07-26 NOTE — Progress Notes (Signed)
[facility] MD Progress Note  07/26/2017 7:15 PM Jeanne Hendricks  MRN:  [id number] Subjective: Follow-up for 47 year old woman with bipolar disorder receiving ECT.  Patient seems to be slightly improved.  Still anxious and disorganized in her thinking.  No new physical complaints.  ECT treatment happened this morning without any difficulty. Principal Problem: Schizoaffective disorder, bipolar type (HCC) Diagnosis:   Patient Active Problem List   Diagnosis Date Noted  . OCD (obsessive compulsive disorder) [F42.9] 07/06/2017  . Schizoaffective disorder, bipolar type (HCC) [F25.0] 07/04/2017  . HTN (hypertension) [I10] 07/04/2017  . Diabetes (HCC) [E11.9] 07/04/2017  . Hypothyroidism [E03.9] 07/04/2017  . Suicidal ideation [R45.851] 07/04/2017   Total Time spent with patient: 30 minutes  Past Psychiatric History: Patient has a history of bipolar disorder with serious impairment in functioning although the current episode seems to be worse than usual.  Positive past hospitalizations  Past Medical History:  Past Medical History:  Diagnosis Date  . Depression   . Diabetes mellitus without complication (HCC)   . Hypertension   . Thyroid disease    History reviewed. No pertinent surgical history. Family History: History reviewed. No pertinent family history. Family Psychiatric  History: Unknown Social History:  Social History   Substance and Sexual Activity  Alcohol Use No  . Frequency: Never     Social History   Substance and Sexual Activity  Drug Use No    Social History   Socioeconomic History  . Marital status: Single    Spouse name: None  . Number of children: None  . Years of education: None  . Highest education level: None  Social Needs  . Financial resource strain: None  . Food insecurity - worry: None  . Food insecurity - inability: None  . Transportation needs - medical: None  . Transportation needs - non-medical: None  Occupational History  . None  Tobacco Use  .  Smoking status: Former Games developer  . Smokeless tobacco: Former Engineer, water and Sexual Activity  . Alcohol use: No    Frequency: Never  . Drug use: No  . Sexual activity: None  Other Topics Concern  . None  Social History Narrative  . None   Additional Social History:                         Sleep: Fair  Appetite:  Fair  Current Medications: Current Facility-Administered Medications  Medication Dose Route Frequency Provider Last Rate Last Dose  . acetaminophen (TYLENOL) tablet 650 mg  650 mg Oral Q6H PRN Pucilowska, Jolanta B, MD   650 mg at 07/12/17 0832  . alum & mag hydroxide-simeth (MAALOX/MYLANTA) 200-200-20 MG/5ML suspension 30 mL  30 mL Oral Q4H PRN Pucilowska, Jolanta B, MD      . atorvastatin (LIPITOR) tablet 10 mg  10 mg Oral q1800 Pucilowska, Jolanta B, MD   10 mg at 07/26/17 1809  . feeding supplement (ENSURE ENLIVE) (ENSURE ENLIVE) liquid 237 mL  237 mL Oral TID BM Pucilowska, Jolanta B, MD   237 mL at 07/25/17 1958  . fluvoxaMINE (LUVOX) tablet 200 mg  200 mg Oral QHS Pucilowska, Jolanta B, MD   200 mg at 07/25/17 2101  . ketorolac (TORADOL) 30 MG/ML injection           . levothyroxine (SYNTHROID, LEVOTHROID) tablet 100 mcg  100 mcg Oral QAC breakfast Pucilowska, Jolanta B, MD   100 mcg at 07/25/17 0914  . LORazepam (ATIVAN) tablet 2 mg  2 mg Oral Q4H PRN Clapacs, Jackquline DenmarkJohn T, MD   2 mg at 07/25/17 2101  . magnesium hydroxide (MILK OF MAGNESIA) suspension 30 mL  30 mL Oral Daily PRN Pucilowska, Jolanta B, MD   30 mL at 07/14/17 1719  . midazolam (VERSED) injection 2 mg  2 mg Intravenous Once Clapacs, John T, MD      . midazolam (VERSED) injection 4 mg  4 mg Intravenous Once Clapacs, John T, MD      . midazolam (VERSED) injection 4 mg  4 mg Intravenous Once Clapacs, John T, MD      . ondansetron [facility](ZOFRAN) 4 MG/2ML injection           . ondansetron (ZOFRAN) injection 4 mg  4 mg Intravenous Once PRN Berdine Addisonhomas, Mathai, MD      . QUEtiapine (SEROQUEL) tablet 400 mg  400  mg Oral QHS Clapacs, Jackquline DenmarkJohn T, MD   400 mg at 07/25/17 2102  . temazepam (RESTORIL) capsule 7.5 mg  7.5 mg Oral QHS Pucilowska, Jolanta B, MD   7.5 mg at 07/25/17 2102    Lab Results:  Results for orders placed or performed during the hospital encounter of 07/05/17 (from the past 48 hour(s))  Glucose, capillary     Status: None   Collection Time: 07/26/17  6:08 AM  Result Value Ref Range   Glucose-Capillary 84 65 - 99 mg/dL    Blood Alcohol level:  Lab Results  Component Value Date   ETH <10 07/03/2017    Metabolic Disorder Labs: Lab Results  Component Value Date   HGBA1C 5.1 07/06/2017   MPG 99.67 07/06/2017   No results found for: PROLACTIN Lab Results  Component Value Date   CHOL 103 07/06/2017   TRIG 112 07/06/2017   HDL 39 (L) 07/06/2017   CHOLHDL 2.6 07/06/2017   VLDL 22 07/06/2017   LDLCALC 42 07/06/2017    Physical Findings: AIMS: Facial and Oral Movements Muscles of Facial Expression: None, normal Lips and Perioral Area: None, normal Jaw: None, normal Tongue: None, normal,Extremity Movements Upper (arms, wrists, hands, fingers): None, normal Lower (legs, knees, ankles, toes): None, normal, Trunk Movements Neck, shoulders, hips: None, normal, Overall Severity Severity of abnormal movements (highest score from questions above): None, normal Incapacitation due to abnormal movements: None, normal Patient's awareness of abnormal movements (rate only patient's report): No Awareness, Dental Status Current problems with teeth and/or dentures?: No Does patient usually wear dentures?: No  CIWA:    COWS:     Musculoskeletal: Strength & Muscle Tone: within normal limits Gait & Station: normal Patient leans: N/A  Psychiatric Specialty Exam: Physical Exam  Nursing note and vitals reviewed. Constitutional: She appears well-developed and well-nourished.  HENT:  Head: Normocephalic and atraumatic.  Eyes: Conjunctivae are normal. Pupils are equal, round, and  reactive to light.  Neck: Normal range of motion.  Cardiovascular: Regular rhythm and normal heart sounds.  Respiratory: Effort normal. No respiratory distress.  GI: Soft.  Musculoskeletal: Normal range of motion.  Neurological: She is alert.  Skin: Skin is warm and dry.  Psychiatric: Her mood appears anxious. Her speech is rapid and/or pressured. She is agitated. She is not aggressive. Thought content is paranoid. Cognition and memory are impaired. She expresses impulsivity. She expresses no homicidal and no suicidal ideation.    Review of Systems  Constitutional: Negative.   HENT: Negative.   Eyes: Negative.   Respiratory: Negative.   Cardiovascular: Negative.   Gastrointestinal: Negative.   Musculoskeletal: Negative.   Skin:  Negative.   Neurological: Negative.   Psychiatric/Behavioral: Positive for depression and memory loss. Negative for hallucinations, substance abuse and suicidal ideas. The patient is nervous/anxious and has insomnia.     Blood pressure 127/77, pulse 95, temperature 99.4 F (37.4 C), temperature source Oral, resp. rate 16, height 5\' 5"  (1.651 m), weight 64.9 kg (143 lb), SpO2 100 %.Body mass index is 23.8 kg/m.  General Appearance: Casual  Eye Contact:  Fair  Speech:  Pressured  Volume:  Increased  Mood:  Anxious  Affect:  Congruent  Thought Process:  Goal Directed  Orientation:  Full (Time, Place, and Person)  Thought Content:  Rumination  Suicidal Thoughts:  No  Homicidal Thoughts:  No  Memory:  Immediate;   Fair Recent;   Fair Remote;   Fair  Judgement:  Fair  Insight:  Fair  Psychomotor Activity:  Increased  Concentration:  Concentration: Fair  Recall:  [employer] of Knowledge:  Fair  Language:  Fair  Akathisia:  No  Handed:  Right  AIMS (if indicated):     Assets:  Desire for Improvement Housing Resilience Social Support  ADL's:  Intact  Cognition:  Impaired,  Mild  Sleep:  Number of Hours: 7.5     Treatment Plan Summary: Daily  contact with patient to assess and evaluate symptoms and progress in treatment, Medication management and Plan Patient had ECT today which once again was tolerated without difficulty.  Tolerating higher dose of Seroquel.  Seems to be showing some improvement.  Still anxious and disorganized in her thinking but calming down and less intrusive.  No new physical complaints.  No change to medication today.  Likely follow up with ECT again on Friday although she may be getting close to discharge ready.  Mordecai Rasmussen, MD 07/26/2017, 7:15 PM

## 2017-07-26 NOTE — Tx Team (Signed)
Interdisciplinary Treatment and Diagnostic Plan Update  07/26/2017 Time of Session: 10:00 AM Jeanne Hendricks MRN: [id number]  Principal Diagnosis: Schizoaffective disorder, bipolar type (HCC)  Secondary Diagnoses: Principal Problem:   Schizoaffective disorder, bipolar type (HCC) Active Problems:   HTN (hypertension)   Diabetes (HCC)   Hypothyroidism   Suicidal ideation   OCD (obsessive compulsive disorder)   Current Medications:  Current Facility-Administered Medications  Medication Dose Route Frequency Provider Last Rate Last Dose  . acetaminophen (TYLENOL) tablet 650 mg  650 mg Oral Q6H PRN Pucilowska, Jolanta B, MD   650 mg at 07/12/17 0832  . alum & mag hydroxide-simeth (MAALOX/MYLANTA) 200-200-20 MG/5ML suspension 30 mL  30 mL Oral Q4H PRN Pucilowska, Jolanta B, MD      . atorvastatin (LIPITOR) tablet 10 mg  10 mg Oral q1800 Pucilowska, Jolanta B, MD   10 mg at 07/25/17 1726  . feeding supplement (ENSURE ENLIVE) (ENSURE ENLIVE) liquid 237 mL  237 mL Oral TID BM Pucilowska, Jolanta B, MD   237 mL at 07/25/17 1958  . fluvoxaMINE (LUVOX) tablet 200 mg  200 mg Oral QHS Pucilowska, Jolanta B, MD   200 mg at 07/25/17 2101  . ketorolac (TORADOL) 30 MG/ML injection           . levothyroxine (SYNTHROID, LEVOTHROID) tablet 100 mcg  100 mcg Oral QAC breakfast Pucilowska, Jolanta B, MD   100 mcg at 07/25/17 0914  . LORazepam (ATIVAN) tablet 2 mg  2 mg Oral Q4H PRN Clapacs, Jackquline DenmarkJohn T, MD   2 mg at 07/25/17 2101  . magnesium hydroxide (MILK OF MAGNESIA) suspension 30 mL  30 mL Oral Daily PRN Pucilowska, Jolanta B, MD   30 mL at 07/14/17 1719  . midazolam (VERSED) injection 2 mg  2 mg Intravenous Once Clapacs, John T, MD      . midazolam (VERSED) injection 4 mg  4 mg Intravenous Once Clapacs, John T, MD      . midazolam (VERSED) injection 4 mg  4 mg Intravenous Once Clapacs, John T, MD      . ondansetron [facility](ZOFRAN) 4 MG/2ML injection           . ondansetron (ZOFRAN) injection 4 mg  4 mg Intravenous  Once PRN Berdine Addisonhomas, Mathai, MD      . QUEtiapine (SEROQUEL) tablet 400 mg  400 mg Oral QHS Clapacs, Jackquline DenmarkJohn T, MD   400 mg at 07/25/17 2102  . temazepam (RESTORIL) capsule 7.5 mg  7.5 mg Oral QHS Pucilowska, Jolanta B, MD   7.5 mg at 07/25/17 2102   PTA Medications: Medications Prior to Admission  Medication Sig Dispense Refill Last Dose  . atorvastatin (LIPITOR) 10 MG tablet Take 10 mg by mouth at bedtime.   07/25/2017 at 1715  . diclofenac (VOLTAREN) 75 MG EC tablet Take 75 mg by mouth 2 (two) times daily.   07/25/2017 at 1715  . docusate sodium (COLACE) 100 MG capsule Take 100 mg by mouth daily as needed for mild constipation.   07/25/2017 at Unknown  . levothyroxine (SYNTHROID, LEVOTHROID) 50 MCG tablet Take 50 mcg by mouth daily before breakfast.   07/25/2017 at Unknown  . losartan (COZAAR) 50 MG tablet Take 50 mg by mouth daily after breakfast.   07/25/2017 at 1715  . metFORMIN (GLUCOPHAGE) 500 MG tablet Take 500 mg by mouth daily before breakfast.   07/25/2017 at 1715  . mirtazapine (REMERON SOL-TAB) 30 MG disintegrating tablet Take 30 mg by mouth at bedtime.   07/25/2017 at Unknown  .  OLANZapine (ZYPREXA) 20 MG tablet Take 20 mg by mouth at bedtime.   07/25/2017 at Unknown  . pantoprazole (PROTONIX) 40 MG tablet Take 40 mg by mouth daily.   07/25/2017 at Unknown  . propranolol (INDERAL) 20 MG tablet Take 20 mg by mouth 2 (two) times daily.   07/25/2017 at 1715  . QUEtiapine (SEROQUEL) 25 MG tablet Take 25 mg by mouth 2 (two) times daily as needed (panic attacks).   07/25/2017 at Unknown  . QUEtiapine (SEROQUEL) 400 MG tablet Take 400 mg by mouth at bedtime.   07/25/2017 at Unknown  . QUEtiapine (SEROQUEL) 50 MG tablet Take 50 mg by mouth 2 (two) times daily as needed (panic attacks).   07/25/2017 at Unknown    Patient Stressors: Health problems  Patient Strengths: Ability for insight Motivation for treatment/growth  Treatment Modalities: Medication Management, Group therapy, Case management,  1 to 1  session with clinician, Psychoeducation, Recreational therapy.   Physician Treatment Plan for Primary Diagnosis: Schizoaffective disorder, bipolar type (HCC) Long Term Goal(s): Improvement in symptoms so as ready for discharge NA   Short Term Goals: Ability to identify changes in lifestyle to reduce recurrence of condition will improve Ability to verbalize feelings will improve Ability to disclose and discuss suicidal ideas Ability to demonstrate self-control will improve Ability to identify and develop effective coping behaviors will improve Ability to maintain clinical measurements within normal limits will improve Ability to identify triggers associated with substance abuse/mental health issues will improve NA  Medication Management: Evaluate patient's response, side effects, and tolerance of medication regimen.  Therapeutic Interventions: 1 to 1 sessions, Unit Group sessions and Medication administration.  Evaluation of Outcomes: Progressing  Physician Treatment Plan for Secondary Diagnosis: Principal Problem:   Schizoaffective disorder, bipolar type (HCC) Active Problems:   HTN (hypertension)   Diabetes (HCC)   Hypothyroidism   Suicidal ideation   OCD (obsessive compulsive disorder)  Long Term Goal(s): Improvement in symptoms so as ready for discharge NA   Short Term Goals: Ability to identify changes in lifestyle to reduce recurrence of condition will improve Ability to verbalize feelings will improve Ability to disclose and discuss suicidal ideas Ability to demonstrate self-control will improve Ability to identify and develop effective coping behaviors will improve Ability to maintain clinical measurements within normal limits will improve Ability to identify triggers associated with substance abuse/mental health issues will improve NA     Medication Management: Evaluate patient's response, side effects, and tolerance of medication regimen.  Therapeutic  Interventions: 1 to 1 sessions, Unit Group sessions and Medication administration.  Evaluation of Outcomes: Progressing   RN Treatment Plan for Primary Diagnosis: Schizoaffective disorder, bipolar type (HCC) Long Term Goal(s): Knowledge of disease and therapeutic regimen to maintain health will improve  Short Term Goals: Ability to demonstrate self-control, Ability to identify and develop effective coping behaviors will improve and Compliance with prescribed medications will improve  Medication Management: RN will administer medications as ordered by provider, will assess and evaluate patient's response and provide education to patient for prescribed medication. RN will report any adverse and/or side effects to prescribing provider.  Therapeutic Interventions: 1 on 1 counseling sessions, Psychoeducation, Medication administration, Evaluate responses to treatment, Monitor vital signs and CBGs as ordered, Perform/monitor CIWA, COWS, AIMS and Fall Risk screenings as ordered, Perform wound care treatments as ordered.  Evaluation of Outcomes: Progressing   LCSW Treatment Plan for Primary Diagnosis: Schizoaffective disorder, bipolar type (HCC) Long Term Goal(s): Safe transition to appropriate next level of care at  discharge, Engage patient in therapeutic group addressing interpersonal concerns.  Short Term Goals: Engage patient in aftercare planning with referrals and resources, Increase social support and Increase skills for wellness and recovery  Therapeutic Interventions: Assess for all discharge needs, 1 to 1 time with Social worker, Explore available resources and support systems, Assess for adequacy in community support network, Educate family and significant other(s) on suicide prevention, Complete Psychosocial Assessment, Interpersonal group therapy.  Evaluation of Outcomes: Progressing   Progress in Treatment: Attending groups: No. Participating in groups: No. Taking medication as  prescribed: Yes. Toleration medication: Yes. Family/Significant other contact made: Yes, individual(s) contacted:  CSW contacted pt's mother Ledora Bottcher. Patient understands diagnosis: No. Discussing patient identified problems/goals with staff: Yes. Medical problems stabilized or resolved: Yes. Denies suicidal/homicidal ideation: Yes. Issues/concerns per patient self-inventory: No. Other: n/a  New problem(s) identified: Yes, Describe:  Pt is much less invasive, but has been having a lot of somatic complaints.    New Short Term/Long Term Goal(s): Pt's stated goal for this hospitalization is "to get better"  Discharge Plan or Barriers: Discharge plan remains in place for pt to be discharged back to her home with her mother with on-going ECT with Dr. Toni Amend.  Reason for Continuation of Hospitalization: Anxiety Medication stabilization  Estimated Length of Stay: 3-5 days  Recreational Therapy: Patient Stressors: Anxiety, Depression Patient Goal: Patient will identify 3 positive coping skills to use post discharge x5 days.   Attendees: Patient:  07/26/2017 3:46 PM  Physician: Mordecai Rasmussen, MD 07/26/2017 3:46 PM  Nursing: Hulan Amato, RN 07/26/2017 3:46 PM  RN Care Manager: 07/26/2017 3:46 PM  Social Worker: Huey Romans, LCSW 07/26/2017 3:46 PM  Recreational Therapist:  07/26/2017 3:46 PM  Other:  07/26/2017 3:46 PM  Other:  07/26/2017 3:46 PM  Other: 07/26/2017 3:46 PM    Scribe for Treatment Team: Alease Frame, LCSW 07/26/2017 3:46 PM

## 2017-07-26 NOTE — [facility] Group Notes (Signed)

## 2017-07-26 NOTE — Progress Notes (Signed)
Dar Note: Patient presents with flat affect and depressed mood.  Patient returned from ECT.  Patient is alert and oriented to person with period of confusion.  Denies suicidal thoughts, auditory and visual hallucinations.  Food and fluid offered and taken.  Minimal interaction with staff and peers.  Routine safety checks maintained every 15 minutes.  Offered support and encouragement as needed.  Patient is safe on the unit.  No acute distress noted.

## 2017-07-26 NOTE — Anesthesia Post-op Follow-up Note (Signed)
Anesthesia QCDR form completed.        

## 2017-07-26 NOTE — [facility] Group Notes (Signed)
[facility] Group Notes:  (Nursing/MHT/Case Management/Adjunct)  Date:  07/26/2017  Time:  2:55 PM  Type of Therapy:  Psychoeducational Skills  Participation Level:  Did Not Attend   Lynelle SmokeCara Travis [facility] Surgical SuitesMadoni 07/26/2017, 2:55 PM

## 2017-07-26 NOTE — H&P (Signed)
Jeanne Hendricks is an 47 y.o. female.   Chief Complaint: Patient with bipolar disorder who has presented with confusion and agitation and manic-like symptoms HPI: See note above.  Initiating ECT today is treatment #3  Past Medical History:  Diagnosis Date  . Depression   . Diabetes mellitus without complication (HCC)   . Hypertension   . Thyroid disease     History reviewed. No pertinent surgical history.  History reviewed. No pertinent family history. Social History:  reports that she has quit smoking. She has quit using smokeless tobacco. She reports that she does not drink alcohol or use drugs.  Allergies:  Allergies  Allergen Reactions  . Bactrim [Sulfamethoxazole-Trimethoprim]   . Compazine [Prochlorperazine Edisylate]     Medications Prior to Admission  Medication Sig Dispense Refill  . atorvastatin (LIPITOR) 10 MG tablet Take 10 mg by mouth at bedtime.    . diclofenac (VOLTAREN) 75 MG EC tablet Take 75 mg by mouth 2 (two) times daily.    Marland Kitchen docusate sodium (COLACE) 100 MG capsule Take 100 mg by mouth daily as needed for mild constipation.    Marland Kitchen levothyroxine (SYNTHROID, LEVOTHROID) 50 MCG tablet Take 50 mcg by mouth daily before breakfast.    . losartan (COZAAR) 50 MG tablet Take 50 mg by mouth daily after breakfast.    . metFORMIN (GLUCOPHAGE) 500 MG tablet Take 500 mg by mouth daily before breakfast.    . mirtazapine (REMERON SOL-TAB) 30 MG disintegrating tablet Take 30 mg by mouth at bedtime.    Marland Kitchen OLANZapine (ZYPREXA) 20 MG tablet Take 20 mg by mouth at bedtime.    . pantoprazole (PROTONIX) 40 MG tablet Take 40 mg by mouth daily.    . propranolol (INDERAL) 20 MG tablet Take 20 mg by mouth 2 (two) times daily.    . QUEtiapine (SEROQUEL) 25 MG tablet Take 25 mg by mouth 2 (two) times daily as needed (panic attacks).    . QUEtiapine (SEROQUEL) 400 MG tablet Take 400 mg by mouth at bedtime.    Marland Kitchen QUEtiapine (SEROQUEL) 50 MG tablet Take 50 mg by mouth 2 (two) times daily as  needed (panic attacks).      Results for orders placed or performed during the hospital encounter of 07/05/17 (from the past 48 hour(s))  Glucose, capillary     Status: None   Collection Time: 07/26/17  6:08 AM  Result Value Ref Range   Glucose-Capillary 84 65 - 99 mg/dL   No results found.  Review of Systems  Constitutional: Negative.   HENT: Negative.   Eyes: Negative.   Respiratory: Negative.   Cardiovascular: Negative.   Gastrointestinal: Negative.   Musculoskeletal: Negative.   Skin: Negative.   Neurological: Negative.   Psychiatric/Behavioral: Positive for memory loss. Negative for depression, hallucinations, substance abuse and suicidal ideas. The patient is nervous/anxious and has insomnia.     Blood pressure 117/78, pulse 96, temperature 97.8 F (36.6 C), temperature source Oral, resp. rate 18, height 5\' 5"  (1.651 m), weight 64.9 kg (143 lb), SpO2 98 %. Physical Exam  Nursing note and vitals reviewed. Constitutional: She appears well-developed and well-nourished.  HENT:  Head: Normocephalic and atraumatic.  Eyes: Conjunctivae are normal. Pupils are equal, round, and reactive to light.  Neck: Normal range of motion.  Cardiovascular: Regular rhythm and normal heart sounds.  Respiratory: Effort normal. No respiratory distress.  GI: Soft.  Musculoskeletal: Normal range of motion.  Neurological: She is alert.  Skin: Skin is warm and dry.  Psychiatric: Her mood appears anxious. Her affect is labile and inappropriate. Her speech is rapid and/or pressured. She is agitated. She is not aggressive. Cognition and memory are impaired. She expresses impulsivity. She expresses no homicidal and no suicidal ideation.     Assessment/Plan Continue with ECT as well as medication management hoping to get her stable enough for discharge  Mordecai RasmussenJohn Tuere Nwosu, MD 07/26/2017, 10:38 AM

## 2017-07-26 NOTE — Anesthesia Preprocedure Evaluation (Addendum)
Anesthesia Evaluation  Patient identified by MRN, date of birth, ID band Patient awake    Reviewed: Allergy & Precautions, H&P , NPO status , Patient's Chart, lab work & pertinent test results, reviewed documented beta blocker date and time   Airway Mallampati: II   Neck ROM: full    Dental  (+) Poor Dentition, Teeth Intact   Pulmonary neg pulmonary ROS, former smoker,    Pulmonary exam normal        Cardiovascular hypertension, negative cardio ROS Normal cardiovascular exam Rhythm:regular Rate:Normal     Neuro/Psych negative neurological ROS  negative psych ROS   GI/Hepatic negative GI ROS, Neg liver ROS,   Endo/Other  negative endocrine ROSdiabetes, Well Controlled, Type 2Hypothyroidism   Renal/GU negative Renal ROS  negative genitourinary   Musculoskeletal   Abdominal   Peds  Hematology negative hematology ROS (+)   Anesthesia Other Findings Past Medical History: No date: Depression No date: Diabetes mellitus without complication (HCC) No date: Hypertension No date: Thyroid disease History reviewed. No pertinent surgical history. BMI    Body Mass Index:  23.80 kg/m     Reproductive/Obstetrics negative OB ROS                             Anesthesia Physical Anesthesia Plan  ASA: III  Anesthesia Plan: General   Post-op Pain Management:    Induction:   PONV Risk Score and Plan:   Airway Management Planned:   Additional Equipment:   Intra-op Plan:   Post-operative Plan:   Informed Consent: I have reviewed the patients History and Physical, chart, labs and discussed the procedure including the risks, benefits and alternatives for the proposed anesthesia with the patient or authorized representative who has indicated his/her understanding and acceptance.   Dental Advisory Given  Plan Discussed with: CRNA  Anesthesia Plan Comments: (Hx of previous TAH)        Anesthesia Quick Evaluation

## 2017-07-26 NOTE — Procedures (Signed)
ECT SERVICES Physician's Interval Evaluation & Treatment Note  Patient Identification: Jeanne Hendricks MRN:  [id number] Date of Evaluation:  07/26/2017 TX #: 3  MADRS:   MMSE:   P.E. Findings:  No change to physical exam.  Vitals unremarkable.  No injury.  No change to heart and lungs.  Psychiatric Interval Note:  Patient continues to be agitated hyperverbal confused  Subjective:  Patient is a 47 y.o. female seen for evaluation for Electroconvulsive Therapy. No specific complaints  Treatment Summary:   [x]   Right Unilateral             []  Bilateral   % Energy : 0.3 ms 75%   Impedance: 1180 ohms  Seizure Energy Index: 2170 V squared  Postictal Suppression Index: 64%  Seizure Concordance Index: 86%  Medications  Pre Shock: Toradol 30 mg, Zofran 4 mg, Brevital 80 mg, succinylcholine 80 mg  Post Shock: Versed 4 mg, Haldol 5 mg  Seizure Duration: 27 seconds by EMG 41 seconds by EEG   Comments: Continue index course in the hospital Next treatment Friday  Lungs:  [x]   Clear to auscultation               []  Other:   Heart:    [x]   Regular rhythm             []  irregular rhythm    [x]   Previous H&P reviewed, patient examined and there are NO CHANGES                 []   Previous H&P reviewed, patient examined and there are changes noted.   Jeanne RasmussenJohn Willaim Mode, MD 2/20/201910:40 AM

## 2017-07-26 NOTE — Progress Notes (Signed)
D: Pt  Passive SI- contracts for safety. denies HI/AVH. Pt is pleasant and cooperative. Pt continues to have poor boundaries and needs re-direction, but pt appeared to need less than prior nights. Pt stated she was doing better due to the ECT.   A: Pt was offered support and encouragement. Pt was given scheduled medications. Pt was encourage to attend groups. Q 15 minute checks were done for safety.   R:Pt attends groups and interacts well with peers and staff. Pt is taking medication. Pt receptive to treatment and safety maintained on unit.

## 2017-07-27 ENCOUNTER — Other Ambulatory Visit: Payer: Self-pay | Admitting: Psychiatry

## 2017-07-27 NOTE — Progress Notes (Signed)
Patient ID: Jeanne Hendricks, female   DOB: 1970-12-02, 47 y.o.   MRN: [id number] CSW followed up with pt's mother, Jeanne Hendricks, to inform her that [facility] pt's referral for [facility] services.  CSW also apologized to Ms. Jeanne Hendricks for the confusion regarding the packet of information that was left for her last week.  CSW informed Ms. Jeanne Hendricks that there was documentation in the packet, that including general information that would better explain how to apply for SSDI benefits as well as a checklist, was for her to keep.  CSW explained to her that this checklist would be helpful for her as it lists all the required documentation that will be needed prior to pt going to the [employer] office and applying for benefits.  CSW informed Ms. Jeanne Hendricks that she would leave the information in pt's room for her to pick up the next time she visits or at discharge.

## 2017-07-27 NOTE — Plan of Care (Signed)
  Progressing Spiritual Needs Ability to function at adequate level 07/27/2017 1634 - Progressing by Elige Radonobb, Nolawi Kanady B, RN Note Patient is up and visible in the milieu.  Attended groups today.  Maintaining personal care chores.   Activity: Interest or engagement in leisure activities will improve 07/27/2017 1634 - Progressing by Elige Radonobb, Noelani Harbach B, RN Note Attended group  today and outside with other patients although there is minimal interaction noted with other patients.  Intrusive with staff.   Education: Utilization of techniques to improve thought processes will improve 07/27/2017 1634 - Progressing by Elige Radonobb, Domingue Coltrain B, RN Note Discussing what she needs to do to get better.  States "I am going to do better" Knowledge of the prescribed therapeutic regimen will improve 07/27/2017 1634 - Progressing by Elige Radonobb, Messina Kosinski B, RN Coping: Ability to cope will improve 07/27/2017 1634 - Progressing by Elige Radonobb, Skila Rollins B, RN Ability to verbalize feelings will improve 07/27/2017 1634 - Progressing by Elige Radonobb, Mattison Stuckey B, RN Note Patient is manipulative and intrusive.  Allow more willing to listen to what she needs to do to improve her behavior.   Health Behavior/Discharge Planning: Compliance with therapeutic regimen will improve 07/27/2017 1634 - Progressing by Elige Radonobb, Paidyn Mcferran B, RN Note Compliant with all medications.   Safety: Ability to disclose and discuss suicidal ideas will improve 07/27/2017 1634 - Progressing by Elige Radonobb, Alilah Mcmeans B, RN Note Denies SI or any thoughts of harming self.     Not Progressing Health Behavior/Discharge Planning: Ability to make decisions will improve 07/27/2017 1634 - Not Progressing by Elige Radonobb, Kamilia Carollo B, RN Note indecisive Role Relationship: Ability to demonstrate positive changes in social behaviors and relationships will improve 07/27/2017 1634 - Not Progressing by Elige Radonobb, Marleni Gallardo B, RN Note Continues to be intrusive and demanding and unwilling to follow directions at times.

## 2017-07-27 NOTE — [facility] Group Notes (Signed)
  07/27/2017 9am  Type of Therapy and Topic: Group Therapy: Goals Group: SMART Goals   Participation Level: Did Not Attend  Description of Group:    The purpose of a daily goals group is to assist and guide patients in setting recovery/wellness-related goals. The objective is to set goals as they relate to the crisis in which they were admitted. Patients will be using SMART goal modalities to set measurable goals. Characteristics of realistic goals will be discussed and patients will be assisted in setting and processing how one will reach their goal. Facilitator will also assist patients in applying interventions and coping skills learned in psycho-education groups to the SMART goal and process how one will achieve defined goal.   Therapeutic Goals:   -Patients will develop and document one goal related to or their crisis in which brought them into treatment.  -Patients will be guided by LCSW using SMART goal setting modality in how to set a measurable, attainable, realistic and time sensitive goal.  -Patients will process barriers in reaching goal.  -Patients will process interventions in how to overcome and successful in reaching goal.   Patient's Goal: Patient was encouraged and invited to attend group. Patient did not attend group. Social worker will continue to encourage group participation in the future.    Therapeutic Modalities:  Motivational Interviewing  Cognitive Behavioral Therapy  Crisis Intervention Model  SMART goals setting  Johny ShearsCassandra  Sovereign Ramiro, [place] 07/27/2017 9:46 AM

## 2017-07-27 NOTE — B[facility] Group Notes (Signed)
07/27/2017  Time: 1:00PM  Type of Therapy/Topic:  Group Therapy:  Balance in Life  Participation Level:  Active  Description of Group:   This group will address the concept of balance and how it feels and looks when one is unbalanced. Patients will be encouraged to process areas in their lives that are out of balance and identify reasons for remaining unbalanced. Facilitators will guide patients in utilizing problem-solving interventions to address and correct the stressor making their life unbalanced. Understanding and applying boundaries will be explored and addressed for obtaining and maintaining a balanced life. Patients will be encouraged to explore ways to assertively make their unbalanced needs known to significant others in their lives, using other group members and facilitator for support and feedback.  Therapeutic Goals: 1. Patient will identify two or more emotions or situations they have that consume much of in their lives. 2. Patient will identify signs/triggers that life has become out of balance:  3. Patient will identify two ways to set boundaries in order to achieve balance in their lives:  4. Patient will demonstrate ability to communicate their needs through discussion and/or role plays  Summary of Patient Progress: Pt continues to work towards their tx goals but has not yet reached them. Pt was able to appropriately participate in group discussion, and was able to offer support/validation to other group members. Pt reported feeling, "ancy today. I can't stay still." Pt reported one area of her life that consumes too much of her energy is, "family." Pt reported one way she can manage a better balanced life is to, "find a hobby."   Therapeutic Modalities:   Cognitive Behavioral Therapy Solution-Focused Therapy Assertiveness Training  Heidi DachKelsey Abayomi Pattison, MSW, LCSW 07/27/2017 1:58 PM

## 2017-07-27 NOTE — Progress Notes (Signed)
[facility]  07/27/2017 10:54 PM Jeanne Hendricks  MRN:  [id number] Subjective: Follow-up for 47 year old woman with bipolar disorder.  Patient today says she is feeling much better and is requesting discharge.  For the first time I would say she actually does look clearly improved.  Her hygiene seems to be in much better than previously.  She is able to hold a conversation without constantly interrupting.  She is able to except an answer to a question without being too intrusive.  On the other hand she still has some intrusiveness and confusion.  Not suicidal or homicidal.  Tolerating ECT and I think getting better. Principal Problem: Schizoaffective disorder, bipolar type (HCC) Diagnosis:   Patient Active Problem List   Diagnosis Date Noted  . OCD (obsessive compulsive disorder) [F42.9] 07/06/2017  . Schizoaffective disorder, bipolar type (HCC) [F25.0] 07/04/2017  . HTN (hypertension) [I10] 07/04/2017  . Diabetes (HCC) [E11.9] 07/04/2017  . Hypothyroidism [E03.9] 07/04/2017  . Suicidal ideation [R45.851] 07/04/2017   Total Time spent with patient: 30 minutes  Past Psychiatric History: Patient has a history of bipolar disorder none known  Past Medical History:  Past Medical History:  Diagnosis Date  . Depression   . Diabetes mellitus without complication (HCC)   . Hypertension   . Thyroid disease    History reviewed. No pertinent surgical history. Family History: History reviewed. No pertinent family history. Family Psychiatric  History: None known Social History:  Social History   Substance and Sexual Activity  Alcohol Use No  . Frequency: Never     Social History   Substance and Sexual Activity  Drug Use No    Social History   Socioeconomic History  . Marital status: Single    Spouse name: None  . Number of children: None  . Years of education: None  . Highest education level: None  Social Needs  . Financial resource strain: None  . Food insecurity -  worry: None  . Food insecurity - inability: None  . Transportation needs - medical: None  . Transportation needs - non-medical: None  Occupational History  . None  Tobacco Use  . Smoking status: Former Games developermoker  . Smokeless tobacco: Former Engineer, waterUser  Substance and Sexual Activity  . Alcohol use: No    Frequency: Never  . Drug use: No  . Sexual activity: None  Other Topics Concern  . None  Social History Narrative  . None   Additional Social History:                         Sleep: Fair  Appetite:  Fair  Current Medications: Current Facility-Administered Medications  Medication Dose Route Frequency Provider Last Rate Last Dose  . acetaminophen (TYLENOL) tablet 650 mg  650 mg Oral Q6H PRN Pucilowska, Jolanta B, MD   650 mg at 07/12/17 0832  . alum & mag hydroxide-simeth (MAALOX/MYLANTA) 200-200-20 MG/5ML suspension 30 mL  30 mL Oral Q4H PRN Pucilowska, Jolanta B, MD      . atorvastatin (LIPITOR) tablet 10 mg  10 mg Oral q1800 Pucilowska, Jolanta B, MD   10 mg at 07/27/17 1809  . feeding supplement (ENSURE ENLIVE) (ENSURE ENLIVE) liquid 237 mL  237 mL Oral TID BM Pucilowska, Jolanta B, MD   237 mL at 07/27/17 0937  . fluvoxaMINE (LUVOX) tablet 200 mg  200 mg Oral QHS Pucilowska, Jolanta B, MD   200 mg at 07/27/17 2008  . levothyroxine (SYNTHROID, LEVOTHROID) tablet 100  mcg  100 mcg Oral QAC breakfast Pucilowska, Jolanta B, MD   100 mcg at 07/27/17 0841  . LORazepam (ATIVAN) tablet 2 mg  2 mg Oral Q4H PRN Clapacs, Jackquline Denmark, MD   2 mg at 07/26/17 2106  . magnesium hydroxide (MILK OF MAGNESIA) suspension 30 mL  30 mL Oral Daily PRN Pucilowska, Jolanta B, MD   30 mL at 07/14/17 1719  . midazolam (VERSED) injection 2 mg  2 mg Intravenous Once Clapacs, John T, MD      . midazolam (VERSED) injection 4 mg  4 mg Intravenous Once Clapacs, John T, MD      . midazolam (VERSED) injection 4 mg  4 mg Intravenous Once Clapacs, John T, MD      . ondansetron [facility]) injection 4 mg  4 mg  Intravenous Once PRN Berdine Addison, MD      . QUEtiapine (SEROQUEL) tablet 400 mg  400 mg Oral QHS Clapacs, John T, MD   400 mg at 07/27/17 2010  . temazepam (RESTORIL) capsule 7.5 mg  7.5 mg Oral QHS Pucilowska, Jolanta B, MD   7.5 mg at 07/27/17 2008    Lab Results:  Results for orders placed or performed during the hospital encounter of 07/05/17 (from the past 48 hour(s))  Glucose, capillary     Status: None   Collection Time: 07/26/17  6:08 AM  Result Value Ref Range   Glucose-Capillary 84 65 - 99 mg/dL    Blood Alcohol level:  Lab Results  Component Value Date   ETH <10 07/03/2017    Metabolic Disorder Labs: Lab Results  Component Value Date   HGBA1C 5.1 07/06/2017   MPG 99.67 07/06/2017   No results found for: PROLACTIN Lab Results  Component Value Date   CHOL 103 07/06/2017   TRIG 112 07/06/2017   HDL 39 (L) 07/06/2017   CHOLHDL 2.6 07/06/2017   VLDL 22 07/06/2017   LDLCALC 42 07/06/2017    Physical Findings: AIMS: Facial and Oral Movements Muscles of Facial Expression: None, normal Lips and Perioral Area: None, normal Jaw: None, normal Tongue: None, normal,Extremity Movements Upper (arms, wrists, hands, fingers): None, normal Lower (legs, knees, ankles, toes): None, normal, Trunk Movements Neck, shoulders, hips: None, normal, Overall Severity Severity of abnormal movements (highest score from questions above): None, normal Incapacitation due to abnormal movements: None, normal Patient's awareness of abnormal movements (rate only patient's report): No Awareness, Dental Status Current problems with teeth and/or dentures?: No Does patient usually wear dentures?: No  CIWA:    COWS:     Musculoskeletal: Strength & Muscle Tone: within normal limits Gait & Station: normal Patient leans: N/A  Psychiatric Specialty Exam: Physical Exam  Nursing note and vitals reviewed. Constitutional: She appears well-developed and well-nourished.  HENT:  Head:  Normocephalic and atraumatic.  Eyes: Conjunctivae are normal. Pupils are equal, round, and reactive to light.  Neck: Normal range of motion.  Cardiovascular: Regular rhythm and normal heart sounds.  Respiratory: Effort normal. No respiratory distress.  GI: Soft.  Musculoskeletal: Normal range of motion.  Neurological: She is alert.  Skin: Skin is warm and dry.  Psychiatric: Her affect is inappropriate. Her speech is delayed. She is slowed. Cognition and memory are impaired. She expresses impulsivity. She expresses no homicidal and no suicidal ideation.    Review of Systems  Constitutional: Negative.   HENT: Negative.   Eyes: Negative.   Respiratory: Negative.   Cardiovascular: Negative.   Gastrointestinal: Negative.   Musculoskeletal: Negative.   Skin:  Negative.   Neurological: Negative.   Psychiatric/Behavioral: Positive for memory loss. Negative for depression, hallucinations, substance abuse and suicidal ideas. The patient is nervous/anxious. The patient does not have insomnia.     Blood pressure 108/72, pulse (!) 103, temperature 99.4 F (37.4 C), temperature source Oral, resp. rate 18, height 5\' 5"  (1.651 m), weight 64.9 kg (143 lb), SpO2 100 %.Body mass index is 23.8 kg/m.  General Appearance: Casual  Eye Contact:  Good  Speech:  Clear and Coherent and Pressured  Volume:  Normal  Mood:  Anxious  Affect:  Congruent  Thought Process:  Goal Directed  Orientation:  Full (Time, Place, and Person)  Thought Content:  Logical  Suicidal Thoughts:  No  Homicidal Thoughts:  No  Memory:  Immediate;   Fair Recent;   Fair Remote;   Fair  Judgement:  Fair  Insight:  Fair  Psychomotor Activity:  Increased  Concentration:  Concentration: Fair  Recall:  [employer] of Knowledge:  Fair  Language:  Fair  Akathisia:  No  Handed:  Right  AIMS (if indicated):     Assets:  Desire for Improvement Housing Physical Health Resilience  ADL's:  Impaired  Cognition:  Impaired,  Mild   Sleep:  Number of Hours: 6.45     Treatment Plan Summary: Daily contact with patient to assess and evaluate symptoms and progress in treatment, Medication management and Plan Patient does seem to be improved however I would point out that she was still pounding on Dr. Willia Craze door today and needed to be redirected.  Still has some degree of confusion.  Insight is partial.  Her affect however was much more appropriate and she no longer looks frightened.  She is agreeable to ECT tomorrow and has even suggested outpatient ECT.  Unfortunately given her financial situation I do not think that would be possible.  She may be getting close to discharge soon.  No other change to medicine today.  Mordecai Rasmussen, MD 07/27/2017, 10:54 PM

## 2017-07-28 ENCOUNTER — Inpatient Hospital Stay: Payer: Medicaid Other | Admitting: Anesthesiology

## 2017-07-28 LAB — GLUCOSE, CAPILLARY
Glucose-Capillary: 91 mg/dL (ref 65–99)
Glucose-Capillary: 115 mg/dL — ABNORMAL HIGH (ref 65–99)

## 2017-07-28 MED ORDER — METHOHEXITAL SODIUM 100 MG/10ML IV SOSY
PREFILLED_SYRINGE | INTRAVENOUS | Status: DC | PRN
  Administered 2017-07-28: 80 mg via INTRAVENOUS

## 2017-07-28 MED ORDER — KETOROLAC TROMETHAMINE 30 MG/ML IJ SOLN
30.0000 mg | Freq: Once | INTRAMUSCULAR | Status: AC
  Administered 2017-07-28: 30 mg via INTRAVENOUS

## 2017-07-28 MED ORDER — HALOPERIDOL LACTATE 5 MG/ML IJ SOLN
INTRAMUSCULAR | Status: AC
  Filled 2017-07-28: qty 1

## 2017-07-28 MED ORDER — ONDANSETRON HCL 4 MG/2ML IJ SOLN
4.0000 mg | Freq: Once | INTRAMUSCULAR | Status: AC
  Administered 2017-07-28: 4 mg via INTRAVENOUS

## 2017-07-28 MED ORDER — KETOROLAC TROMETHAMINE 30 MG/ML IJ SOLN
INTRAMUSCULAR | Status: AC
  Administered 2017-07-28: 30 mg via INTRAVENOUS
  Filled 2017-07-28: qty 1

## 2017-07-28 MED ORDER — SUCCINYLCHOLINE CHLORIDE 200 MG/10ML IV SOSY
PREFILLED_SYRINGE | INTRAVENOUS | Status: DC | PRN
  Administered 2017-07-28: 80 mg via INTRAVENOUS

## 2017-07-28 MED ORDER — MIDAZOLAM HCL 2 MG/2ML IJ SOLN
INTRAMUSCULAR | Status: DC | PRN
  Administered 2017-07-28: 2 mg via INTRAVENOUS

## 2017-07-28 MED ORDER — SUCCINYLCHOLINE CHLORIDE 20 MG/ML IJ SOLN
INTRAMUSCULAR | Status: AC
  Filled 2017-07-28: qty 1

## 2017-07-28 MED ORDER — ONDANSETRON HCL 4 MG/2ML IJ SOLN
INTRAMUSCULAR | Status: AC
  Administered 2017-07-28: 4 mg via INTRAVENOUS
  Filled 2017-07-28: qty 2

## 2017-07-28 MED ORDER — SODIUM CHLORIDE 0.9 % IV SOLN
500.0000 mL | Freq: Once | INTRAVENOUS | Status: AC
  Administered 2017-07-28: 500 mL via INTRAVENOUS

## 2017-07-28 MED ORDER — HALOPERIDOL LACTATE 5 MG/ML IJ SOLN
INTRAMUSCULAR | Status: DC | PRN
  Administered 2017-07-28: 5 mg via INTRAVENOUS

## 2017-07-28 MED ORDER — MIDAZOLAM HCL 2 MG/2ML IJ SOLN
INTRAMUSCULAR | Status: AC
  Filled 2017-07-28: qty 2

## 2017-07-28 MED ORDER — SODIUM CHLORIDE 0.9 % IV SOLN
INTRAVENOUS | Status: DC | PRN
  Administered 2017-07-28: 09:00:00 via INTRAVENOUS

## 2017-07-28 MED ORDER — FENTANYL CITRATE (PF) 100 MCG/2ML IJ SOLN: 25.0000 ug | INTRAMUSCULAR | Status: DC | PRN

## 2017-07-28 MED ORDER — ONDANSETRON HCL 4 MG/2ML IJ SOLN: 4.0000 mg | Freq: Once | INTRAMUSCULAR | Status: DC | PRN

## 2017-07-28 MED ORDER — QUETIAPINE FUMARATE 200 MG PO TABS
500.0000 mg | ORAL_TABLET | Freq: Every day | ORAL | Status: DC
  Administered 2017-07-28 – 2017-07-29 (×2): 500 mg via ORAL
  Filled 2017-07-28 (×2): qty 1

## 2017-07-28 NOTE — Progress Notes (Signed)
   07/28/17 1515  Clinical Encounter Type  Visited With Patient  Visit Type Initial   Patient participated in chaplain led group on relaxation and visualization techniques.

## 2017-07-28 NOTE — Procedures (Signed)
ECT SERVICES Physician's Interval Evaluation & Treatment Note  Patient Identification: Myra Gianottiancy Jean Aguon MRN:  [id number] Date of Evaluation:  07/28/2017 TX #: 4  MADRS:   MMSE:   P.E. Findings:  No change to physical exam.  Vitals unremarkable.  No physical complaint.  Psychiatric Interval Note:  Patient remains intermittently agitated and intrusive.  She is clearly having some cognitive effects as well.  Subjective:  Patient is a 47 y.o. female seen for evaluation for Electroconvulsive Therapy. Still feeling anxious.  Insight Limited.  Treatment Summary:   [x]   Right Unilateral             []  Bilateral   % Energy : 0.3 ms 75%   Impedance: 1310 ohms  Seizure Energy Index: 4890 V squared  Postictal Suppression Index: 72%  Seizure Concordance Index: 70%  Medications  Pre Shock: Toradol 30 mg, Zofran 4 mg, Brevital 80 mg, succinylcholine 80 mg  Post Shock: Versed 4 mg Haldol 5 mg  Seizure Duration: 28 seconds EMG 76 seconds EEG   Comments: I think we are likely to continue treatment into next week.  Lungs:  [x]   Clear to auscultation               []  Other:   Heart:    [x]   Regular rhythm             []  irregular rhythm    [x]   Previous H&P reviewed, patient examined and there are NO CHANGES                 []   Previous H&P reviewed, patient examined and there are changes noted.   Mordecai RasmussenJohn Kaylyn Garrow, MD 2/22/201911:08 AM

## 2017-07-28 NOTE — Plan of Care (Signed)
Patient is alert and oriented; denies SI, HI and AVH. Patient continues to be intrusive but is able to be redirected. Patient participated in ECT today and  was able to answer all questions upon return. Patient has not commented today about having problems going to the restroom. Patient is very fixated on Ativan and her next dose. Patient continuously asks for "A Shot." Patient stated she is very anxious; nurse provided patient with coping skill options such as coloring; deep breathing; reading; or walking. Nurse will continue to monitor. Safety checks will continue Q 15 minutes. Spiritual Needs Ability to function at adequate level 07/28/2017 1512 - Progressing by Leamon ArntPowell, Alessio Bogan K, RN   Activity: Interest or engagement in leisure activities will improve 07/28/2017 1512 - Progressing by Leamon ArntPowell, Tuck Dulworth K, RN Imbalance in normal sleep/wake cycle will improve 07/28/2017 1512 - Progressing by Leamon ArntPowell, Jovannie Ulibarri K, RN   Education: Utilization of techniques to improve thought processes will improve 07/28/2017 1512 - Progressing by Leamon ArntPowell, Tauren Delbuono K, RN Knowledge of the prescribed therapeutic regimen will improve 07/28/2017 1512 - Progressing by Leamon ArntPowell, Alontae Chaloux K, RN

## 2017-07-28 NOTE — Progress Notes (Signed)
[facility] MD Progress Note  07/28/2017 8:03 PM Jeanne Hendricks  MRN:  [id number] Subjective: Follow-up progress note for 47 year old woman with a history of bipolar disorder who is receiving ECT.  Patient had ECT treatment today which went off without any complication.  Overall she is improving although she continues to be intrusive and hyperverbal and difficult to redirect.  I do not think this is all cognitive impairment from the ECT in fact she seems to be having very little memory impairment.  She was having exactly the sort of behavior before we started.  She does look like she is slowing down a little bit but her insight and agitation remain.  She is not threatening or suicidal.  She is requesting discharge frequently but ultimately could be redirected Principal Problem: Schizoaffective disorder, bipolar type (HCC) Diagnosis:   Patient Active Problem List   Diagnosis Date Noted  . OCD (obsessive compulsive disorder) [F42.9] 07/06/2017  . Schizoaffective disorder, bipolar type (HCC) [F25.0] 07/04/2017  . HTN (hypertension) [I10] 07/04/2017  . Diabetes (HCC) [E11.9] 07/04/2017  . Hypothyroidism [E03.9] 07/04/2017  . Suicidal ideation [R45.851] 07/04/2017   Total Time spent with patient: 30 minutes  Past Psychiatric History: Reported history of bipolar disorder.  Unclear on what the details  Past Medical History:  Past Medical History:  Diagnosis Date  . Depression   . Diabetes mellitus without complication (HCC)   . Hypertension   . Thyroid disease    History reviewed. No pertinent surgical history. Family History: History reviewed. No pertinent family history. Family Psychiatric  History: Unknown Social History:  Social History   Substance and Sexual Activity  Alcohol Use No  . Frequency: Never     Social History   Substance and Sexual Activity  Drug Use No    Social History   Socioeconomic History  . Marital status: Single    Spouse name: None  . Number of children: None   . Years of education: None  . Highest education level: None  Social Needs  . Financial resource strain: None  . Food insecurity - worry: None  . Food insecurity - inability: None  . Transportation needs - medical: None  . Transportation needs - non-medical: None  Occupational History  . None  Tobacco Use  . Smoking status: Former Games developer  . Smokeless tobacco: Former Engineer, water and Sexual Activity  . Alcohol use: No    Frequency: Never  . Drug use: No  . Sexual activity: None  Other Topics Concern  . None  Social History Narrative  . None   Additional Social History:                         Sleep: Fair  Appetite:  Fair  Current Medications: Current Facility-Administered Medications  Medication Dose Route Frequency Provider Last Rate Last Dose  . acetaminophen (TYLENOL) tablet 650 mg  650 mg Oral Q6H PRN Pucilowska, Jolanta B, MD   650 mg at 07/12/17 0832  . alum & mag hydroxide-simeth (MAALOX/MYLANTA) 200-200-20 MG/5ML suspension 30 mL  30 mL Oral Q4H PRN Pucilowska, Jolanta B, MD      . atorvastatin (LIPITOR) tablet 10 mg  10 mg Oral q1800 Pucilowska, Jolanta B, MD   10 mg at 07/27/17 1809  . feeding supplement (ENSURE ENLIVE) (ENSURE ENLIVE) liquid 237 mL  237 mL Oral TID BM Pucilowska, Jolanta B, MD   237 mL at 07/27/17 0937  . fluvoxaMINE (LUVOX) tablet 200 mg  200 mg Oral QHS Pucilowska, Jolanta B, MD   200 mg at 07/27/17 2008  . levothyroxine (SYNTHROID, LEVOTHROID) tablet 100 mcg  100 mcg Oral QAC breakfast Pucilowska, Jolanta B, MD   100 mcg at 07/27/17 0841  . LORazepam (ATIVAN) tablet 2 mg  2 mg Oral Q4H PRN Clapacs, Jackquline DenmarkJohn T, MD   2 mg at 07/28/17 1259  . magnesium hydroxide (MILK OF MAGNESIA) suspension 30 mL  30 mL Oral Daily PRN Pucilowska, Jolanta B, MD   30 mL at 07/14/17 1719  . midazolam (VERSED) injection 2 mg  2 mg Intravenous Once Clapacs, John T, MD      . midazolam (VERSED) injection 4 mg  4 mg Intravenous Once Clapacs, John T, MD      .  midazolam (VERSED) injection 4 mg  4 mg Intravenous Once Clapacs, John T, MD      . QUEtiapine (SEROQUEL) tablet 400 mg  400 mg Oral QHS Clapacs, John T, MD   400 mg at 07/27/17 2010  . temazepam (RESTORIL) capsule 7.5 mg  7.5 mg Oral QHS Pucilowska, Jolanta B, MD   7.5 mg at 07/27/17 2008    Lab Results:  Results for orders placed or performed during the hospital encounter of 07/05/17 (from the past 48 hour(s))  Glucose, capillary     Status: None   Collection Time: 07/28/17  6:23 AM  Result Value Ref Range   Glucose-Capillary 91 65 - 99 mg/dL  Glucose, capillary     Status: Abnormal   Collection Time: 07/28/17 11:28 AM  Result Value Ref Range   Glucose-Capillary 115 (H) 65 - 99 mg/dL    Blood Alcohol level:  Lab Results  Component Value Date   ETH <10 07/03/2017    Metabolic Disorder Labs: Lab Results  Component Value Date   HGBA1C 5.1 07/06/2017   MPG 99.67 07/06/2017   No results found for: PROLACTIN Lab Results  Component Value Date   CHOL 103 07/06/2017   TRIG 112 07/06/2017   HDL 39 (L) 07/06/2017   CHOLHDL 2.6 07/06/2017   VLDL 22 07/06/2017   LDLCALC 42 07/06/2017    Physical Findings: AIMS: Facial and Oral Movements Muscles of Facial Expression: None, normal Lips and Perioral Area: None, normal Jaw: None, normal Tongue: None, normal,Extremity Movements Upper (arms, wrists, hands, fingers): None, normal Lower (legs, knees, ankles, toes): None, normal, Trunk Movements Neck, shoulders, hips: None, normal, Overall Severity Severity of abnormal movements (highest score from questions above): None, normal Incapacitation due to abnormal movements: None, normal Patient's awareness of abnormal movements (rate only patient's report): No Awareness, Dental Status Current problems with teeth and/or dentures?: No Does patient usually wear dentures?: No  CIWA:    COWS:     Musculoskeletal: Strength & Muscle Tone: within normal limits Gait & Station:  normal Patient leans: N/A  Psychiatric Specialty Exam: Physical Exam  Nursing note and vitals reviewed. Constitutional: She appears well-developed and well-nourished.  HENT:  Head: Normocephalic and atraumatic.  Eyes: Conjunctivae are normal. Pupils are equal, round, and reactive to light.  Neck: Normal range of motion.  Cardiovascular: Regular rhythm and normal heart sounds.  Respiratory: Effort normal. No respiratory distress.  GI: Soft.  Musculoskeletal: Normal range of motion.  Neurological: She is alert.  Skin: Skin is warm and dry.  Psychiatric: Her mood appears anxious. Her affect is labile and inappropriate. Her speech is tangential. She is agitated. She is not aggressive. Thought content is not paranoid. Cognition and memory are impaired.  She expresses impulsivity. She expresses no homicidal and no suicidal ideation.    Review of Systems  Constitutional: Negative.   HENT: Negative.   Eyes: Negative.   Respiratory: Negative.   Cardiovascular: Negative.   Gastrointestinal: Negative.   Musculoskeletal: Negative.   Skin: Negative.   Neurological: Negative.   Psychiatric/Behavioral: Positive for memory loss. Negative for depression, hallucinations, substance abuse and suicidal ideas. The patient is nervous/anxious and has insomnia.     Blood pressure 119/89, pulse 97, temperature 98.3 F (36.8 C), resp. rate 13, height 5\' 5"  (1.651 m), weight 64.4 kg (142 lb), SpO2 97 %.Body mass index is 23.63 kg/m.  General Appearance: Casual  Eye Contact:  Fair  Speech:  Pressured  Volume:  Increased  Mood:  Anxious  Affect:  Congruent  Thought Process:  Goal Directed  Orientation:  Full (Time, Place, and Person)  Thought Content:  Illogical and Tangential  Suicidal Thoughts:  No  Homicidal Thoughts:  No  Memory:  Immediate;   Fair Recent;   Fair Remote;   Fair  Judgement:  Impaired  Insight:  Shallow  Psychomotor Activity:  Increased  Concentration:  Concentration: Fair   Recall:  [employer] of Knowledge:  Fair  Language:  Fair  Akathisia:  No  Handed:  Right  AIMS (if indicated):     Assets:  Desire for Improvement Financial Resources/Insurance Housing Physical Health Resilience Social Support  ADL's:  Impaired  Cognition:  Impaired,  Mild  Sleep:  Number of Hours: 7.75     Treatment Plan Summary: Daily contact with patient to assess and evaluate symptoms and progress in treatment, Medication management and Plan 47 year old woman with bipolar disorder and a mixed or may presentation.  Seems to be showing a little bit of improvement.  Remains very impatient.  There have been moments when she looks like she has gotten quite a bit better but then regresses into the same very agitated hyper intrusive state with poor insight.  Plan is to continue ECT.  I am increasing her dose of Seroquel once again as she appears to be tolerating that without difficulty.  Patient finally was made to understand that we would not be discharging her today but will consider an open mind about it into early next week.  Spoke with social work as well.  Mordecai Rasmussen, MD 07/28/2017, 8:03 PM

## 2017-07-28 NOTE — Plan of Care (Signed)
Pt. Presents less intrusive this evening, able to communicate better with peers and staff. Pt. Participates in groups and unit activities. Pt. Reports sleeping good. Pt. Reports feeling, "better" today then she did yesterday. Pt. Compliant with medications this evening. Pt. Is able to deny si/hi. Pt. Is able to verbally contract for safety. Pt. Is able to state she is able to remain safe while on the unit. Pt. Needs reinforcement on provided education.    Progressing Spiritual Needs Ability to function at adequate level 07/28/2017 2155 - Progressing by Lenox PondsStevens, Stockton Nunley J, RN Activity: Interest or engagement in leisure activities will improve 07/28/2017 2155 - Progressing by Lenox PondsStevens, Gev[place] Ayyad J, RN Imbalance in normal sleep/wake cycle will improve 07/28/2017 2155 - Progressing by Lenox PondsStevens, Sameera Betton J, RN Coping: Ability to cope will improve 07/28/2017 2155 - Progressing by Lenox PondsStevens, Taylour Lietzke J, RN Ability to verbalize feelings will improve 07/28/2017 2155 - Progressing by Lenox PondsStevens, Eban Weick J, RN Health Behavior/Discharge Planning: Ability to make decisions will improve 07/28/2017 2155 - Progressing by Lenox PondsStevens, Kamaile Zachow J, RN Compliance with therapeutic regimen will improve 07/28/2017 2155 - Progressing by Lenox PondsStevens, Estellar Cadena J, RN Role Relationship: Ability to demonstrate positive changes in social behaviors and relationships will improve 07/28/2017 2155 - Progressing by Lenox PondsStevens, Domonik Levario J, RN Safety: Ability to disclose and discuss suicidal ideas will improve 07/28/2017 2155 - Progressing by Lenox PondsStevens, Chenel Wernli J, RN Self-Concept: Ability to verbalize positive feelings about self will improve 07/28/2017 2155 - Progressing by Lenox PondsStevens, Jazelyn Sipe J, RN [facility] Participation in Recreation Therapeutic Interventions STG-Other Recreation Therapy Goal (Specify) Description Patient will identify 3 positive coping skills to use post discharge x5 days.  07/28/2017 2155 - Progressing by Lenox PondsStevens, Farrell Pantaleo J, RN Activity: Sleeping patterns will  improve 07/28/2017 2155 - Progressing by Lenox PondsStevens, Solei Wubben J, RN   Not Progressing Education: Knowledge of the prescribed therapeutic regimen will improve 07/28/2017 2155 - Not Progressing by Lenox PondsStevens, Abdur Hoglund J, RN

## 2017-07-28 NOTE — Progress Notes (Signed)
Patient ID: Jeanne Hendricks, female   DOB: 01-15-1971, 47 y.o.   MRN: [id number] CSW followed up with pt's psychiatrist, Dr. Toni Amendlapacs, to discuss discharge planning.  Dr. Toni Amendlapacs shared that he feels that pt is much better, but this changes from day to day (one day engages with others and talks logically and the other she is intrusive and very anxious).  Dr. Toni Amendlapacs noted that pt is very fixated on being discharged.  He shared that he is not sure if she is at her baseline so he would like to talk with her parents and get their thoughts.  Dr. Toni Amendlapacs shared that his discharge plan for her is 5-7 days.  He would like to continue with ECT and discuss her progress with her family before discharging her home.  CSW will need to speak with pt's mother, Dennie Bibleat, about what provider pt will use following discharge in order to make a discharge f/u appointment.

## 2017-07-28 NOTE — Progress Notes (Signed)
D: Patient denies SI/HI/AVH.  Patient is intrusive and several attempts had to be made for redirection.  Patient constantly at nurse's station seeking assistance.  Patient visible on the milieu. No distress noted. A: Support and encouragement offered. Scheduled medications given to pt. Q 15 min checks continued for patient safety. R: Patient receptive. Patient remains safe on the unit.

## 2017-07-28 NOTE — Anesthesia Post-op Follow-up Note (Signed)
Anesthesia QCDR form completed.        

## 2017-07-28 NOTE — H&P (Signed)
Jeanne Hendricks is an 47 y.o. female.   Chief Complaint: Patient continues to be very anxious.  Not reporting suicidal ideation. HPI: Bipolar disorder with mixed depressive episode  Past Medical History:  Diagnosis Date  . Depression   . Diabetes mellitus without complication (HCC)   . Hypertension   . Thyroid disease     History reviewed. No pertinent surgical history.  History reviewed. No pertinent family history. Social History:  reports that she has quit smoking. She has quit using smokeless tobacco. She reports that she does not drink alcohol or use drugs.  Allergies:  Allergies  Allergen Reactions  . Bactrim [Sulfamethoxazole-Trimethoprim]   . Compazine [Prochlorperazine Edisylate]     Medications Prior to Admission  Medication Sig Dispense Refill  . atorvastatin (LIPITOR) 10 MG tablet Take 10 mg by mouth at bedtime.    . diclofenac (VOLTAREN) 75 MG EC tablet Take 75 mg by mouth 2 (two) times daily.    Marland Kitchen. docusate sodium (COLACE) 100 MG capsule Take 100 mg by mouth daily as needed for mild constipation.    Marland Kitchen. levothyroxine (SYNTHROID, LEVOTHROID) 50 MCG tablet Take 50 mcg by mouth daily before breakfast.    . losartan (COZAAR) 50 MG tablet Take 50 mg by mouth daily after breakfast.    . metFORMIN (GLUCOPHAGE) 500 MG tablet Take 500 mg by mouth daily before breakfast.    . mirtazapine (REMERON SOL-TAB) 30 MG disintegrating tablet Take 30 mg by mouth at bedtime.    Marland Kitchen. OLANZapine (ZYPREXA) 20 MG tablet Take 20 mg by mouth at bedtime.    . pantoprazole (PROTONIX) 40 MG tablet Take 40 mg by mouth daily.    . propranolol (INDERAL) 20 MG tablet Take 20 mg by mouth 2 (two) times daily.    . QUEtiapine (SEROQUEL) 25 MG tablet Take 25 mg by mouth 2 (two) times daily as needed (panic attacks).    . QUEtiapine (SEROQUEL) 400 MG tablet Take 400 mg by mouth at bedtime.    Marland Kitchen. QUEtiapine (SEROQUEL) 50 MG tablet Take 50 mg by mouth 2 (two) times daily as needed (panic attacks).      Results  for orders placed or performed during the hospital encounter of 07/05/17 (from the past 48 hour(s))  Glucose, capillary     Status: None   Collection Time: 07/28/17  6:23 AM  Result Value Ref Range   Glucose-Capillary 91 65 - 99 mg/dL   No results found.  Review of Systems  Constitutional: Negative.   HENT: Negative.   Eyes: Negative.   Respiratory: Negative.   Cardiovascular: Negative.   Gastrointestinal: Negative.   Musculoskeletal: Negative.   Skin: Negative.   Neurological: Negative.   Psychiatric/Behavioral: Positive for memory loss. Negative for depression, hallucinations, substance abuse and suicidal ideas. The patient is nervous/anxious. The patient does not have insomnia.     Blood pressure (!) 149/99, pulse 79, temperature 97.8 F (36.6 C), temperature source Oral, resp. rate 18, height 5\' 5"  (1.651 m), weight 64.4 kg (142 lb), SpO2 100 %. Physical Exam  Nursing note and vitals reviewed. Constitutional: She appears well-developed and well-nourished.  HENT:  Head: Normocephalic and atraumatic.  Eyes: Conjunctivae are normal. Pupils are equal, round, and reactive to light.  Neck: Normal range of motion.  Cardiovascular: Regular rhythm and normal heart sounds.  Respiratory: Effort normal. No respiratory distress.  GI: Soft.  Musculoskeletal: Normal range of motion.  Neurological: She is alert.  Skin: Skin is warm and dry.  Psychiatric: Her mood  appears anxious. Her affect is labile and inappropriate. Her speech is rapid and/or pressured. She is agitated. She is not aggressive. Thought content is not paranoid. Cognition and memory are impaired. She expresses impulsivity. She expresses no homicidal and no suicidal ideation.     Assessment/Plan Patient is showing a little improvement but still remains off of baseline.  I am recommending we continue treatment at this point with more aggressive medication management  Mordecai Rasmussen, MD 07/28/2017, 11:07 AM

## 2017-07-28 NOTE — [facility] Group Notes (Signed)
LCSW Group Therapy Note  07/28/2017 1:00 pm  Type of Therapy and Topic:  Group Therapy:  Feelings around Relapse and Recovery  Participation Level:  Active   Description of Group:    Patients in this group will discuss emotions they experience before and after a relapse. They will process how experiencing these feelings, or avoidance of experiencing them, relates to having a relapse. Facilitator will guide patients to explore emotions they have related to recovery. Patients will be encouraged to process which emotions are more powerful. They will be guided to discuss the emotional reaction significant others in their lives may have to their relapse or recovery. Patients will be assisted in exploring ways to respond to the emotions of others without this contributing to a relapse.  Therapeutic Goals: 1. Patient will identify two or more emotions that lead to a relapse for them 2. Patient will identify two emotions that result when they relapse 3. Patient will identify two emotions related to recovery 4. Patient will demonstrate ability to communicate their needs through discussion and/or role plays   Summary of Patient Progress:  Jeanne Hendricks was able to actively participate in today's group and able to communicate her need through group discussion.  Jeanne Hendricks shared one emotion that she experiences that often lead to relapse for her which is severe anxiety.  Jeanne Hendricks shared that a stressor for her that have led to relapse is having to argue with others.  Jeanne Hendricks shared that one thing that works for recovery efforts is to stay away from people in order not to argue with them.  She agreed with other group participants that taking her medications consistently is also helpful for her recovery.     Therapeutic Modalities:   Cognitive Behavioral Therapy Solution-Focused Therapy Assertiveness Training Relapse Prevention Therapy   Alease FrameSonya S Dannetta Lekas, LCSW 07/28/2017 3:19 PM

## 2017-07-28 NOTE — Anesthesia Preprocedure Evaluation (Signed)
Anesthesia Evaluation  Patient identified by MRN, date of birth, ID band Patient awake    Reviewed: Allergy & Precautions, H&P , NPO status , Patient's Chart, lab work & pertinent test results, reviewed documented beta blocker date and time   Airway Mallampati: II   Neck ROM: full    Dental  (+) Poor Dentition   Pulmonary neg pulmonary ROS, former smoker,    Pulmonary exam normal        Cardiovascular Exercise Tolerance: Good hypertension, On Medications negative cardio ROS Normal cardiovascular exam Rhythm:regular Rate:Normal     Neuro/Psych negative neurological ROS  negative psych ROS   GI/Hepatic negative GI ROS, Neg liver ROS,   Endo/Other  negative endocrine ROSdiabetesHypothyroidism   Renal/GU negative Renal ROS  negative genitourinary   Musculoskeletal   Abdominal   Peds  Hematology negative hematology ROS (+)   Anesthesia Other Findings Past Medical History: No date: Depression No date: Diabetes mellitus without complication (HCC) No date: Hypertension No date: Thyroid disease History reviewed. No pertinent surgical history. BMI    Body Mass Index:  23.63 kg/m     Reproductive/Obstetrics negative OB ROS                             Anesthesia Physical Anesthesia Plan  ASA: II  Anesthesia Plan: General   Post-op Pain Management:    Induction:   PONV Risk Score and Plan:   Airway Management Planned:   Additional Equipment:   Intra-op Plan:   Post-operative Plan:   Informed Consent: I have reviewed the patients History and Physical, chart, labs and discussed the procedure including the risks, benefits and alternatives for the proposed anesthesia with the patient or authorized representative who has indicated his/her understanding and acceptance.   Dental Advisory Given  Plan Discussed with: CRNA  Anesthesia Plan Comments:         Anesthesia Quick  Evaluation

## 2017-07-28 NOTE — Anesthesia Procedure Notes (Signed)
Date/Time: 07/28/2017 11:16 AM Performed by: Lily KocherPeralta, Frandy Basnett, CRNA Pre-anesthesia Checklist: Patient identified, Emergency Drugs available, Suction available and Patient being monitored Patient Re-evaluated:Patient Re-evaluated prior to induction Oxygen Delivery Method: Circle system utilized Preoxygenation: Pre-oxygenation with 100% oxygen Induction Type: IV induction Ventilation: Mask ventilation without difficulty and Mask ventilation throughout procedure Airway Equipment and Method: Bite block Placement Confirmation: positive ETCO2 Dental Injury: Teeth and Oropharynx as per pre-operative assessment

## 2017-07-28 NOTE — Transfer of Care (Signed)
Immediate Anesthesia Transfer of Care Note  Patient: Jeanne Hendricks  Procedure(s) Performed: ECT TX  Patient Location: PACU  Anesthesia Type:General  Level of Consciousness: sedated  Airway & Oxygen Therapy: Patient Spontanous Breathing and Patient connected to face mask oxygen  Post-op Assessment: Report given to RN and Post -op Vital signs reviewed and stable  Post vital signs: Reviewed and stable  Last Vitals:  Vitals:   07/28/17 1002 07/28/17 1127  BP: (!) 149/99 (!) 145/98  Pulse: 79 (!) 27  Resp: 18 16  Temp: 36.6 C 36.4 C  SpO2: 100% 94%    Last Pain:  Vitals:   07/28/17 1127  TempSrc:   PainSc: 0-No pain         Complications: No apparent anesthesia complications

## 2017-07-29 DIAGNOSIS — F250 Schizoaffective disorder, bipolar type: Principal | ICD-10-CM

## 2017-07-29 MED ORDER — BUSPIRONE HCL 15 MG PO TABS
15.0000 mg | ORAL_TABLET | Freq: Three times a day (TID) | ORAL | Status: DC | PRN
  Administered 2017-07-29 – 2017-07-30 (×2): 15 mg via ORAL
  Filled 2017-07-29 (×4): qty 1

## 2017-07-29 NOTE — Progress Notes (Signed)
D:Pt denies SI/HI/AVH. Pt. Verbally is able to contract for safety. Pt. Verbalizes she can remain safe while on the unit.Pt continues to be intrusive and needy upon presentation. Pt.has noComplaints, but contines to frequently asks the same questions over and over again, requiring frequent redirection from staff, despite questions being answered extensively.Pt. Appears to have had her hair braided today, which she was happy about, but still appears to not be fully attending to all of her ADLs. Pt. reports sleeping good.Pt. Appetite isgood.Pt. Reports good this evening and "better".   A: Q x 15 minute observation checks were completed for safety. Patient was provided with education.Pt. Needs reinforcement on provided education.Patient was given scheduled medications. Patient was encourage to attend groups, participate in unit activities and continue with plan of care.   R:Patient is complaint with medication and unit procedures. Pt. Does attend groups.            Precautionary checks every 15 minutes for safety maintained, room free of safety hazards, patient sustains no injury or falls during this shift.

## 2017-07-29 NOTE — Plan of Care (Signed)
  Progressing Spiritual Needs Ability to function at adequate level 07/29/2017 1146 - Progressing by Elige Radonobb, Ekansh Sherk B, RN Note Up for meals, maintaining personal care chores.  Less intrusive with staff and patient.   Activity: Interest or engagement in leisure activities will improve 07/29/2017 1146 - Progressing by Elige Radonobb, Mckensey Berghuis B, RN Note Visible in the milieu Education: Knowledge of the prescribed therapeutic regimen will improve 07/29/2017 1146 - Progressing by Elige Radonobb, Lora Chavers B, RN Coping: Ability to cope will improve 07/29/2017 1146 - Progressing by Elige Radonobb, Melayah Skorupski B, RN Ability to verbalize feelings will improve 07/29/2017 1146 - Progressing by Elige Radonobb, Aviana Shevlin B, RN Note Verbalizes feelings appropriatesl Health Behavior/Discharge Planning: Compliance with therapeutic regimen will improve 07/29/2017 1146 - Progressing by Elige Radonobb, Jeffrie Lofstrom B, RN Note Medication compliant  Role Relationship: Ability to demonstrate positive changes in social behaviors and relationships will improve 07/29/2017 1146 - Progressing by Elige Radonobb, Coralyn Roselli B, RN Note Patient is less intrusive Safety: Ability to disclose and discuss suicidal ideas will improve 07/29/2017 1146 - Progressing by Elige Radonobb, Tarell Schollmeyer B, RN Note Denies SI and any thoughts of self harm

## 2017-07-29 NOTE — Progress Notes (Addendum)
[facility] MD Progress Note  07/29/2017 12:11 PM Jeanne Hendricks  MRN:  [id number] Subjective:  Patient was started on ECT on 07/28/2017.  Today endorses to me that she is doing much better.  Patient is, and confined to her room.  This is in contrast to her behavior during last week when she was agitated.  We will continue to observe her.  Patient denies any side effects such as headache or memory impairment from the ECT.  Looks forward to getting more ECTs next week.  Asks me if she can go home next week. Principal Problem: Schizoaffective disorder, bipolar type (HCC) Diagnosis:   Patient Active Problem List   Diagnosis Date Noted  . OCD (obsessive compulsive disorder) [F42.9] 07/06/2017  . Schizoaffective disorder, bipolar type (HCC) [F25.0] 07/04/2017  . HTN (hypertension) [I10] 07/04/2017  . Diabetes (HCC) [E11.9] 07/04/2017  . Hypothyroidism [E03.9] 07/04/2017  . Suicidal ideation [R45.851] 07/04/2017   Total Time spent with patient: 30 minutes  Past Psychiatric History: Reported history of bipolar disorder.  Unclear on what the details  Past Medical History:  Past Medical History:  Diagnosis Date  . Depression   . Diabetes mellitus without complication (HCC)   . Hypertension   . Thyroid disease    History reviewed. No pertinent surgical history. Family History: History reviewed. No pertinent family history. Family Psychiatric  History: Unknown Social History:  Social History   Substance and Sexual Activity  Alcohol Use No  . Frequency: Never     Social History   Substance and Sexual Activity  Drug Use No    Social History   Socioeconomic History  . Marital status: Single    Spouse name: None  . Number of children: None  . Years of education: None  . Highest education level: None  Social Needs  . Financial resource strain: None  . Food insecurity - worry: None  . Food insecurity - inability: None  . Transportation needs - medical: None  . Transportation needs -  non-medical: None  Occupational History  . None  Tobacco Use  . Smoking status: Former Games developer  . Smokeless tobacco: Former Engineer, water and Sexual Activity  . Alcohol use: No    Frequency: Never  . Drug use: No  . Sexual activity: None  Other Topics Concern  . None  Social History Narrative  . None   Additional Social History:                         Sleep: Fair  Appetite:  Fair  Current Medications: Current Facility-Administered Medications  Medication Dose Route Frequency Provider Last Rate Last Dose  . acetaminophen (TYLENOL) tablet 650 mg  650 mg Oral Q6H PRN Pucilowska, Jolanta B, MD   650 mg at 07/12/17 0832  . alum & mag hydroxide-simeth (MAALOX/MYLANTA) 200-200-20 MG/5ML suspension 30 mL  30 mL Oral Q4H PRN Pucilowska, Jolanta B, MD      . atorvastatin (LIPITOR) tablet 10 mg  10 mg Oral q1800 Pucilowska, Jolanta B, MD   10 mg at 07/27/17 1809  . feeding supplement (ENSURE ENLIVE) (ENSURE ENLIVE) liquid 237 mL  237 mL Oral TID BM Pucilowska, Jolanta B, MD   237 mL at 07/28/17 2025  . fluvoxaMINE (LUVOX) tablet 200 mg  200 mg Oral QHS Pucilowska, Jolanta B, MD   200 mg at 07/28/17 2111  . levothyroxine (SYNTHROID, LEVOTHROID) tablet 100 mcg  100 mcg Oral QAC breakfast Pucilowska, Ellin Goodie, MD  100 mcg at 07/29/17 0815  . LORazepam (ATIVAN) tablet 2 mg  2 mg Oral Q4H PRN Clapacs, Jackquline DenmarkJohn T, MD   2 mg at 07/28/17 1259  . magnesium hydroxide (MILK OF MAGNESIA) suspension 30 mL  30 mL Oral Daily PRN Pucilowska, Jolanta B, MD   30 mL at 07/14/17 1719  . midazolam (VERSED) injection 2 mg  2 mg Intravenous Once Clapacs, John T, MD      . midazolam (VERSED) injection 4 mg  4 mg Intravenous Once Clapacs, John T, MD      . midazolam (VERSED) injection 4 mg  4 mg Intravenous Once Clapacs, John T, MD      . QUEtiapine (SEROQUEL) tablet 500 mg  500 mg Oral QHS Clapacs, Jackquline DenmarkJohn T, MD   500 mg at 07/28/17 2118  . temazepam (RESTORIL) capsule 7.5 mg  7.5 mg Oral QHS Pucilowska,  Jolanta B, MD   7.5 mg at 07/28/17 2111    Lab Results:  Results for orders placed or performed during the hospital encounter of 07/05/17 (from the past 48 hour(s))  Glucose, capillary     Status: None   Collection Time: 07/28/17  6:23 AM  Result Value Ref Range   Glucose-Capillary 91 65 - 99 mg/dL  Glucose, capillary     Status: Abnormal   Collection Time: 07/28/17 11:28 AM  Result Value Ref Range   Glucose-Capillary 115 (H) 65 - 99 mg/dL    Blood Alcohol level:  Lab Results  Component Value Date   ETH <10 07/03/2017    Metabolic Disorder Labs: Lab Results  Component Value Date   HGBA1C 5.1 07/06/2017   MPG 99.67 07/06/2017   No results found for: PROLACTIN Lab Results  Component Value Date   CHOL 103 07/06/2017   TRIG 112 07/06/2017   HDL 39 (L) 07/06/2017   CHOLHDL 2.6 07/06/2017   VLDL 22 07/06/2017   LDLCALC 42 07/06/2017    Physical Findings: AIMS: Facial and Oral Movements Muscles of Facial Expression: None, normal Lips and Perioral Area: None, normal Jaw: None, normal Tongue: None, normal,Extremity Movements Upper (arms, wrists, hands, fingers): None, normal Lower (legs, knees, ankles, toes): None, normal, Trunk Movements Neck, shoulders, hips: None, normal, Overall Severity Severity of abnormal movements (highest score from questions above): None, normal Incapacitation due to abnormal movements: None, normal Patient's awareness of abnormal movements (rate only patient's report): No Awareness, Dental Status Current problems with teeth and/or dentures?: No Does patient usually wear dentures?: No  CIWA:    COWS:     Musculoskeletal: Strength & Muscle Tone: within normal limits Gait & Station: normal Patient leans: N/A  Psychiatric Specialty Exam: Physical Exam  Nursing note and vitals reviewed. Constitutional: She appears well-developed and well-nourished.  HENT:  Head: Normocephalic and atraumatic.  Eyes: Conjunctivae are normal. Pupils are  equal, round, and reactive to light.  Neck: Normal range of motion.  Cardiovascular: Regular rhythm and normal heart sounds.  Respiratory: Effort normal. No respiratory distress.  GI: Soft.  Musculoskeletal: Normal range of motion.  Neurological: She is alert.  Skin: Skin is warm and dry.  Psychiatric: Her mood appears anxious. Her affect is labile and inappropriate. Her speech is tangential. She is agitated. She is not aggressive. Thought content is not paranoid. Cognition and memory are impaired. She expresses impulsivity. She expresses no homicidal and no suicidal ideation.    Review of Systems  Constitutional: Negative.   HENT: Negative.   Eyes: Negative.   Respiratory: Negative.   Cardiovascular:  Negative.   Gastrointestinal: Negative.   Musculoskeletal: Negative.   Skin: Negative.   Neurological: Negative.   Psychiatric/Behavioral: Positive for memory loss. Negative for depression, hallucinations, substance abuse and suicidal ideas. The patient is nervous/anxious and has insomnia.     Blood pressure 123/66, pulse (!) 102, temperature 98.3 F (36.8 C), resp. rate 16, height 5\' 5"  (1.651 m), weight 142 lb (64.4 kg), SpO2 97 %.Body mass index is 23.63 kg/m.  General Appearance: Casual  Eye Contact:  Fair  Speech:  Pressured  Volume:  Increased  Mood:  Anxious  Affect:  Congruent  Thought Process:  Goal Directed  Orientation:  Full (Time, Place, and Person)  Thought Content:  Illogical and Tangential  Suicidal Thoughts:  No  Homicidal Thoughts:  No  Memory:  Immediate;   Fair Recent;   Fair Remote;   Fair  Judgement:  Impaired  Insight:  Shallow  Psychomotor Activity:  Increased  Concentration:  Concentration: Fair  Recall:  [employer] of Knowledge:  Fair  Language:  Fair  Akathisia:  No  Handed:  Right  AIMS (if indicated):     Assets:  Desire for Improvement Financial Resources/Insurance Housing Physical Health Resilience Social Support  ADL's:  Impaired   Cognition:  Impaired,  Mild  Sleep:  Number of Hours: 6.45     Treatment Plan Summary: 47 year old female with a history of schizoaffective disorder, started on ECT is on 07/28/2017.  Has had significant improvement with the first ECT and plan would comprise continuing more ECTs.  #Schizoaffective disorder, bipolar type -Continue ECTs -Continue Seroquel 500 mg at bedtime -Temazepam 7.5 mg at bedtime -Continue fluvoxamine 200 mg daily at bedtime -Continue Lorazepam 2 mg every 4 hours as needed for anxiety  #Hypothyroidism -Continue Synthroid 100 mcg daily before breakfast  #Hyperlipidemia -Continue Lipitor 10 mg daily  TSH 31.372 on January 31, A1c and lipid panel unremarkable QTc 445 ms on EKG. Aundria Rud, MD 07/29/2017, 12:11 PM

## 2017-07-29 NOTE — Progress Notes (Signed)
D:Pt denies SI/HI/AVH. Pt. Verbally is able to contract for safety. Pt. Verbalizes she can remain safe while on the unit.Pt continues to be intrusive and needy upon presentation, but much improvement in behavior overall this evening it appears. Pt. Requires less redirection from staff and peers from behaviors. Pt. Has no compliants this evening, and is able to control preoccupations more. Pt. does not ask as many questions repeatedly, but will still repeat questions being answered, despite questions answered to satisfaction. Pt appears to still not be fully attending to all of her ADLs. Pt. reports sleeping good.Pt. Appetite isgood.Pt. Reports good this evening and "better". Pt. Is able to utilize more logical moments in conversation during assessments. Still tangential at times. Less anxious overall to this Clinical research associatewriter. Pt. Is more flat today.   A: Q x 15 minute observation checks were completed for safety. Patient was provided with education.Pt. Needs reinforcement on provided education.Patient was given scheduled medications. Patient was encourage to attend groups, participate in unit activities and continue with plan of care.   R:Patient is complaint with medication and unit procedures. Pt. Does not attend groups.            Precautionary checks every 15 minutes for safety maintained, room free of safety hazards, patient sustains no injury or falls during this shift.

## 2017-07-29 NOTE — Plan of Care (Signed)
Pt. Presents less intrusive this evening, able to communicate better with peers and staff. Pt. Does not Participates in groups. Pt. Reports sleeping good. Pt. Reports feeling, "better" today then she did yesterday. Pt. Compliant with medications this evening. Pt. Is able to deny si/hi. Pt. Is able to verbally contract for safety. Pt. Is able to state she is able to remain safe while on the unit. Pt. Needs reinforcement on provided education.     Progressing Activity: Imbalance in normal sleep/wake cycle will improve 07/29/2017 2250 - Progressing by Lenox PondsStevens, Danene Montijo J, RN Coping: Ability to verbalize feelings will improve 07/29/2017 2250 - Progressing by Lenox PondsStevens, Tamina Cyphers J, RN Health Behavior/Discharge Planning: Compliance with therapeutic regimen will improve 07/29/2017 2250 - Progressing by Lenox PondsStevens, Raylea Adcox J, RN Role Relationship: Ability to demonstrate positive changes in social behaviors and relationships will improve 07/29/2017 2250 - Progressing by Lenox PondsStevens, Jammi Morrissette J, RN Safety: Ability to disclose and discuss suicidal ideas will improve 07/29/2017 2250 - Progressing by Lenox PondsStevens, Aslee Such J, RN Self-Concept: Ability to verbalize positive feelings about self will improve 07/29/2017 2250 - Progressing by Lenox PondsStevens, Thadd Apuzzo J, RN Activity: Sleeping patterns will improve 07/29/2017 2250 - Progressing by Lenox PondsStevens, Marleena Shubert J, RN

## 2017-07-29 NOTE — [facility] Group Notes (Signed)
LCSW Group Therapy Note   07/29/2017 1:15pm   Type of Therapy and Topic:  Group Therapy:  Trust and Honesty  Participation Level:  Did Not Attend  Description of Group:    In this group patients will be asked to explore the value of being honest.  Patients will be guided to discuss their thoughts, feelings, and behaviors related to honesty and trusting in others. Patients will process together how trust and honesty relate to forming relationships with peers, family members, and self. Each patient will be challenged to identify and express feelings of being vulnerable. Patients will discuss reasons why people are dishonest and identify alternative outcomes if one was truthful (to self or others). This group will be process-oriented, with patients participating in exploration of their own experiences, giving and receiving support, and processing challenge from other group members.   Therapeutic Goals: 1. Patient will identify why honesty is important to relationships and how honesty overall affects relationships.  2. Patient will identify a situation where they lied or were lied too and the  feelings, thought process, and behaviors surrounding the situation 3. Patient will identify the meaning of being vulnerable, how that feels, and how that correlates to being honest with self and others. 4. Patient will identify situations where they could have told the truth, but instead lied and explain reasons of dishonesty.   Summary of Patient Progress    Therapeutic Modalities:   Cognitive Behavioral Therapy Solution Focused Therapy Motivational Interviewing Brief Therapy  Glena Pharris  CUEBAS-COLON, LCSW 07/29/2017 12:44 PM  

## 2017-07-30 MED ORDER — LORAZEPAM 0.5 MG PO TABS: 0.5000 mg | ORAL_TABLET | Freq: Four times a day (QID) | ORAL | Status: DC | PRN

## 2017-07-30 MED ORDER — QUETIAPINE FUMARATE 100 MG PO TABS
500.0000 mg | ORAL_TABLET | Freq: Every day | ORAL | Status: DC
  Administered 2017-07-30 – 2017-08-02 (×4): 500 mg via ORAL
  Filled 2017-07-30 (×4): qty 5

## 2017-07-30 MED ORDER — BUSPIRONE HCL 15 MG PO TABS
15.0000 mg | ORAL_TABLET | Freq: Three times a day (TID) | ORAL | Status: DC | PRN
  Filled 2017-07-30: qty 1

## 2017-07-30 NOTE — Plan of Care (Signed)
Pt. Presents much less intrusive this evening, able to communicate better with peers and staff. Pt. Requires less redirection from staff this evening. Pt. Does not repetitively ask as many of the same questions over and over this evening as well. Pt.does Participate in groups. Pt. Reports sleeping good. Pt. Continues to Report feeling, "better" today then she did yesterday. Pt. Compliant with medications this evening. Pt. Is able to deny si/hi. Pt. Is able to verbally contract for safety. Pt. Is able to state she is able to remain safe while on the unit. Pt. Needs reinforcement on provided education.    Progressing Activity: Interest or engagement in leisure activities will improve 07/30/2017 2140 - Progressing by Lenox PondsStevens, Consuello Lassalle J, RN Imbalance in normal sleep/wake cycle will improve 07/30/2017 2140 - Progressing by Lenox PondsStevens, Kimala Horne J, RN Coping: Ability to verbalize feelings will improve 07/30/2017 2140 - Progressing by Lenox PondsStevens, Janique Hoefer J, RN Health Behavior/Discharge Planning: Compliance with therapeutic regimen will improve 07/30/2017 2140 - Progressing by Lenox PondsStevens, Bain Whichard J, RN Safety: Ability to disclose and discuss suicidal ideas will improve 07/30/2017 2140 - Progressing by Lenox PondsStevens, Anyssa Sharpless J, RN Self-Concept: Ability to verbalize positive feelings about self will improve 07/30/2017 2140 - Progressing by Lenox PondsStevens, Kura Bethards J, RN [facility] Participation in Recreation Therapeutic Interventions STG-Other Recreation Therapy Goal (Specify) Description Patient will identify 3 positive coping skills to use post discharge x5 days.  07/30/2017 2140 - Progressing by Lenox PondsStevens, Helga Asbury J, RN Activity: Sleeping patterns will improve 07/30/2017 2140 - Progressing by Lenox PondsStevens, Mac Dowdell J, RN

## 2017-07-30 NOTE — Progress Notes (Signed)
.  DContinues to come to nursing station, very needy  But at a lesser degree. Noted being with her peers but not interacting: Patient stated slept good last night .Stated appetite is good and energy level  Is normal. Stated concentration is good . Stated on Depression scale 10 , hopeless 8  and anxiety 10.( low 0-10 high) Denies suicidal  homicidal ideations  .  No auditory hallucinations  No pain concerns . Appropriate ADL'S. Interacting with peers and staff.  A: Encourage patient participation with unit programming . Instruction  Given on  Medication , verbalize understanding. R: Voice no other concerns. Staff continue to monitor

## 2017-07-30 NOTE — [facility] Group Notes (Signed)
LCSW Group Therapy Note 07/30/2017 1:15pm  Type of Therapy and Topic: Group Therapy: Feelings Around Returning Home & Establishing a Supportive Framework and Supporting Oneself When Supports Not Available  Participation Level: Active  Description of Group:  Patients first processed thoughts and feelings about upcoming discharge. These included fears of upcoming changes, lack of change, new living environments, judgements and expectations from others and overall stigma of mental health issues. The group then discussed the definition of a supportive framework, what that looks and feels like, and how do to discern it from an unhealthy non-supportive network. The group identified different types of supports as well as what to do when your family/friends are less than helpful or unavailable  Therapeutic Goals  1. Patient will identify one healthy supportive network that they can use at discharge. 2. Patient will identify one factor of a supportive framework and how to tell it from an unhealthy network. 3. Patient able to identify one coping skill to use when they do not have positive supports from others. 4. Patient will demonstrate ability to communicate their needs through discussion and/or role plays.  Summary of Patient Progress:  Pt scored her mood at 5 (10 best). Pt engaged during group session. As patients processed their anxiety about discharge and described healthy supports patient shared that she is ready to go home. She stated she has to clean her room. Pt stated she is feelings less anxious. Patients identified at least one self-care tool they were willing to use after discharge.   Therapeutic Modalities Cognitive Behavioral Therapy Motivational Interviewing   Jeanne Hendricks  CUEBAS-COLON, LCSW 07/30/2017 10:12 AM

## 2017-07-30 NOTE — Progress Notes (Signed)
[facility] MD Progress Note  07/30/2017 11:00 AM Crystallynn Noorani  MRN:  [id number] Subjective:  Patient was started on ECT on 07/28/2017.    Patient continues to be fixated on her as needed Ativan.  We stopped this yesterday and transitioned her to buspirone.  Patient continues to request that these she get Ativan.  Denies any anxiety symptoms.  Endorses that Ativan is needed to make her feel okay.  We had an extensive discussion and patient agreed to take buspirone. At times patient continues to pace around on the unit, rarely interacts with other patients.  Denies any suicidal thoughts or thoughts about dying.  Denies any headache or nausea or memory issues. Principal Problem: Schizoaffective disorder, bipolar type (HCC) Diagnosis:   Patient Active Problem List   Diagnosis Date Noted  . OCD (obsessive compulsive disorder) [F42.9] 07/06/2017  . Schizoaffective disorder, bipolar type (HCC) [F25.0] 07/04/2017  . HTN (hypertension) [I10] 07/04/2017  . Diabetes (HCC) [E11.9] 07/04/2017  . Hypothyroidism [E03.9] 07/04/2017  . Suicidal ideation [R45.851] 07/04/2017   Total Time spent with patient: 30 minutes  Past Psychiatric History: Reported history of bipolar disorder.  Unclear on what the details  Past Medical History:  Past Medical History:  Diagnosis Date  . Depression   . Diabetes mellitus without complication (HCC)   . Hypertension   . Thyroid disease    History reviewed. No pertinent surgical history. Family History: History reviewed. No pertinent family history. Family Psychiatric  History: Unknown Social History:  Social History   Substance and Sexual Activity  Alcohol Use No  . Frequency: Never     Social History   Substance and Sexual Activity  Drug Use No    Social History   Socioeconomic History  . Marital status: Single    Spouse name: None  . Number of children: None  . Years of education: None  . Highest education level: None  Social Needs  . Financial  resource strain: None  . Food insecurity - worry: None  . Food insecurity - inability: None  . Transportation needs - medical: None  . Transportation needs - non-medical: None  Occupational History  . None  Tobacco Use  . Smoking status: Former Games developer  . Smokeless tobacco: Former Engineer, water and Sexual Activity  . Alcohol use: No    Frequency: Never  . Drug use: No  . Sexual activity: None  Other Topics Concern  . None  Social History Narrative  . None   Additional Social History:                         Sleep: Fair  Appetite:  Fair  Current Medications: Current Facility-Administered Medications  Medication Dose Route Frequency Provider Last Rate Last Dose  . acetaminophen (TYLENOL) tablet 650 mg  650 mg Oral Q6H PRN Pucilowska, Jolanta B, MD   650 mg at 07/12/17 0832  . alum & mag hydroxide-simeth (MAALOX/MYLANTA) 200-200-20 MG/5ML suspension 30 mL  30 mL Oral Q4H PRN Pucilowska, Jolanta B, MD      . atorvastatin (LIPITOR) tablet 10 mg  10 mg Oral q1800 Pucilowska, Jolanta B, MD   10 mg at 07/29/17 1734  . busPIRone (BUSPAR) tablet 15 mg  15 mg Oral TID PRN Aundria Rud, MD      . feeding supplement (ENSURE ENLIVE) (ENSURE ENLIVE) liquid 237 mL  237 mL Oral TID BM Pucilowska, Jolanta B, MD   237 mL at 07/30/17 1003  . fluvoxaMINE (  LUVOX) tablet 200 mg  200 mg Oral QHS Pucilowska, Jolanta B, MD   200 mg at 07/29/17 2109  . levothyroxine (SYNTHROID, LEVOTHROID) tablet 100 mcg  100 mcg Oral QAC breakfast Pucilowska, Jolanta B, MD   100 mcg at 07/30/17 0757  . magnesium hydroxide (MILK OF MAGNESIA) suspension 30 mL  30 mL Oral Daily PRN Pucilowska, Jolanta B, MD   30 mL at 07/14/17 1719  . midazolam (VERSED) injection 2 mg  2 mg Intravenous Once Clapacs, John T, MD      . midazolam (VERSED) injection 4 mg  4 mg Intravenous Once Clapacs, John T, MD      . midazolam (VERSED) injection 4 mg  4 mg Intravenous Once Clapacs, John T, MD      . QUEtiapine (SEROQUEL)  tablet 500 mg  500 mg Oral QHS Clapacs, Jackquline Denmark, MD   500 mg at 07/29/17 2108  . temazepam (RESTORIL) capsule 7.5 mg  7.5 mg Oral QHS Pucilowska, Jolanta B, MD   7.5 mg at 07/29/17 2109    Lab Results:  Results for orders placed or performed during the hospital encounter of 07/05/17 (from the past 48 hour(s))  Glucose, capillary     Status: Abnormal   Collection Time: 07/28/17 11:28 AM  Result Value Ref Range   Glucose-Capillary 115 (H) 65 - 99 mg/dL    Blood Alcohol level:  Lab Results  Component Value Date   ETH <10 07/03/2017    Metabolic Disorder Labs: Lab Results  Component Value Date   HGBA1C 5.1 07/06/2017   MPG 99.67 07/06/2017   No results found for: PROLACTIN Lab Results  Component Value Date   CHOL 103 07/06/2017   TRIG 112 07/06/2017   HDL 39 (L) 07/06/2017   CHOLHDL 2.6 07/06/2017   VLDL 22 07/06/2017   LDLCALC 42 07/06/2017    Physical Findings: AIMS: Facial and Oral Movements Muscles of Facial Expression: None, normal Lips and Perioral Area: None, normal Jaw: None, normal Tongue: None, normal,Extremity Movements Upper (arms, wrists, hands, fingers): None, normal Lower (legs, knees, ankles, toes): None, normal, Trunk Movements Neck, shoulders, hips: None, normal, Overall Severity Severity of abnormal movements (highest score from questions above): None, normal Incapacitation due to abnormal movements: None, normal Patient's awareness of abnormal movements (rate only patient's report): No Awareness, Dental Status Current problems with teeth and/or dentures?: No Does patient usually wear dentures?: No  CIWA:    COWS:     Musculoskeletal: Strength & Muscle Tone: within normal limits Gait & Station: normal Patient leans: N/A  Psychiatric Specialty Exam: Physical Exam  Nursing note and vitals reviewed. Constitutional: She appears well-developed and well-nourished.  HENT:  Head: Normocephalic and atraumatic.  Eyes: Conjunctivae are normal. Pupils  are equal, round, and reactive to light.  Neck: Normal range of motion.  Cardiovascular: Regular rhythm and normal heart sounds.  Respiratory: Effort normal. No respiratory distress.  GI: Soft.  Musculoskeletal: Normal range of motion.  Neurological: She is alert.  Skin: Skin is warm and dry.  Psychiatric: Her mood appears anxious. Her affect is labile and inappropriate. Her speech is tangential. She is agitated. She is not aggressive. Thought content is not paranoid. Cognition and memory are impaired. She expresses impulsivity. She expresses no homicidal and no suicidal ideation.    Review of Systems  Constitutional: Negative.   HENT: Negative.   Eyes: Negative.   Respiratory: Negative.   Cardiovascular: Negative.   Gastrointestinal: Negative.   Musculoskeletal: Negative.   Skin: Negative.  Neurological: Negative.   Psychiatric/Behavioral: Positive for memory loss. Negative for depression, hallucinations, substance abuse and suicidal ideas. The patient is nervous/anxious and has insomnia.     Blood pressure 126/79, pulse 89, temperature 98.1 F (36.7 C), temperature source Oral, resp. rate 18, height 5\' 5"  (1.651 m), weight 142 lb (64.4 kg), SpO2 97 %.Body mass index is 23.63 kg/m.  General Appearance: Casual  Eye Contact:  Fair  Speech:  Pressured  Volume:  Increased  Mood:  Anxious  Affect:  Congruent  Thought Process:  Goal Directed  Orientation:  Full (Time, Place, and Person)  Thought Content:  Illogical and Tangential  Suicidal Thoughts:  No  Homicidal Thoughts:  No  Memory:  Immediate;   Fair Recent;   Fair Remote;   Fair  Judgement:  Impaired  Insight:  Shallow  Psychomotor Activity:  Increased  Concentration:  Concentration: Fair  Recall:  [employer]Fair  Fund of Knowledge:  Fair  Language:  Fair  Akathisia:  No  Handed:  Right  AIMS (if indicated):     Assets:  Desire for Improvement Financial Resources/Insurance Housing Physical Health Resilience Social  Support  ADL's:  Impaired  Cognition:  Impaired,  Mild  Sleep:  Number of Hours: 7.15     Treatment Plan Summary: 47 year old female with a history of schizoaffective disorder, started on ECT is on 07/28/2017.  Has had significant improvement with the first ECT and plan would comprise continuing more ECTs.  #Schizoaffective disorder, bipolar type -Continue ECTs -Continue Seroquel 500 mg at bedtime -Temazepam 7.5 mg at bedtime -Continue fluvoxamine 200 mg daily at bedtime DC lorazepam, start buspirone 15 mg thrice daily as needed  #Hypothyroidism -Continue Synthroid 100 mcg daily before breakfast  #Hyperlipidemia -Continue Lipitor 10 mg daily  TSH 31.372 on January 31, A1c and lipid panel unremarkable QTc 445 ms on EKG. Aundria RudGopalkumar Dearia Wilmouth, MD 07/30/2017, 11:00 AM

## 2017-07-30 NOTE — Plan of Care (Signed)
Attending unit programing . Voice no concerns around wake and sleep cycles   Attending  ECT therapy to improved  behavior and manor . Patient aware of medication  given  No safety concerns  Not Progressing Spiritual Needs Ability to function at adequate level 07/30/2017 1140 - Not Progressing by [name], Adreanna Fickel A, RN 07/30/2017 1125 - Progressing by [name], Oriel Ojo A, RN Activity: Interest or engagement in leisure activities will improve 07/30/2017 1140 - Not Progressing by [name], Adaiah Jaskot A, RN 07/30/2017 1125 - Progressing by [name], Llesenia Fogal A, RN Imbalance in normal sleep/wake cycle will improve 07/30/2017 1140 - Not Progressing by [name], Sonni Barse A, RN 07/30/2017 1125 - Progressing by [name], Darrian Grzelak A, RN Education: Utilization of techniques to improve thought processes will improve 07/30/2017 1140 - Not Progressing by [name], Miyah Hampshire A, RN 07/30/2017 1125 - Progressing by [name], Eleshia Wooley A, RN Knowledge of the prescribed therapeutic regimen will improve 07/30/2017 1140 - Not Progressing by [name], Carnie Bruemmer A, RN 07/30/2017 1125 - Progressing by [name], Elaria Osias A, RN Coping: Ability to cope will improve 07/30/2017 1140 - Not Progressing by [name], Clarivel Callaway A, RN 07/30/2017 1125 - Progressing by [name], Barri Neidlinger A, RN Ability to verbalize feelings will improve 07/30/2017 1140 - Not Progressing by [name], Aubria Vanecek A, RN 07/30/2017 1125 - Progressing by [name], Marquest Gunkel A, RN Health Behavior/Discharge Planning: Ability to make decisions will improve 07/30/2017 1140 - Not Progressing by [name], Elexis Pollak A, RN 07/30/2017 1125 - Progressing by [name], Zenith Lamphier A, RN Compliance with therapeutic regimen will improve 07/30/2017 1140 - Not Progressing by [name], Sway Guttierrez A, RN 07/30/2017 1125 - Progressing by [name], Leibish Mcgregor A, RN Role Relationship: Ability to demonstrate positive changes in social behaviors and relationships will improve 07/30/2017 1140 - Not Progressing by [name], Eshaal Duby A, RN 07/30/2017 1125 - Progressing by [name], Cameka Rae A,  RN Safety: Ability to disclose and discuss suicidal ideas will improve 07/30/2017 1140 - Not Progressing by [name], Krissy Orebaugh A, RN 07/30/2017 1125 - Progressing by [name], Raheim Beutler A, RN Ability to identify and utilize support systems that promote safety will improve 07/30/2017 1140 - Not Progressing by [name], Bri Wakeman A, RN 07/30/2017 1125 - Progressing by [name], Kollins Fenter A, RN Self-Concept: Ability to verbalize positive feelings about self will improve 07/30/2017 1140 - Not Progressing by [name], Jeanenne Licea A, RN 07/30/2017 1125 - Progressing by [name], Kent Riendeau A, RN [facility] Participation in Recreation Therapeutic Interventions STG-Other Recreation Therapy Goal (Specify) Description Patient will identify 3 positive coping skills to use post discharge x5 days.  07/30/2017 1140 - Not Progressing by [name], Cloyde Oregel A, RN 07/30/2017 1125 - Progressing by [name], Pang Robers A, RN Activity: Sleeping patterns will improve 07/30/2017 1140 - Not Progressing by [name], Gabryela Kimbrell A, RN 07/30/2017 1125 - Progressing by [name], Eniola Cerullo A, RN

## 2017-07-30 NOTE — Progress Notes (Signed)
D:Pt denies SI/HI/AVH. Pt. Verbally is able to contract for safety. Pt. Verbalizes she can remain safe while on the unit.Pt continues to be intrusive andneedy upon presentation, but a big improvement in behavior overall this evening with engagements with staff and peers. Pt. Requires much less redirection from staff and peers from behaviors. Pt. Has no compliants this evening, and is able to control preoccupations more effectively. Pt. does not ask the same questions as many times this evening as previous interactions with this Clinical research associatewriter. Pt appears to continue to not be fully attending to all of her ADLs and presents disheveled. Pt. Encouraged to attend to ADLs daily and PRN. Pt. reportssleeping good.Pt. Appetite isfair.Pt. Reports to be continuing to be, "better".Pt. Is able to utilize more logical moments in conversation during assessments this evening. Still tangential at times. Less anxious overall to this Clinical research associatewriter. Pt. Continues to have a slight flat/blunted facial presentation.   A: Q x 15 minute observation checks were completed for safety. Patient was provided with education.Pt. Needs reinforcement on provided education.Patient was given scheduled medications. Patient was encourage to attend groups, participate in unit activities and continue with plan of care.   R:Patient is complaint with medication and unit procedures. Pt. Does attendgroups.            Precautionary checks every 15 minutes for safety maintained, room free of safety hazards, patient sustains no injury or falls during this shift.

## 2017-07-31 ENCOUNTER — Other Ambulatory Visit: Payer: Self-pay | Admitting: Psychiatry

## 2017-07-31 ENCOUNTER — Inpatient Hospital Stay: Payer: Medicaid Other | Admitting: Anesthesiology

## 2017-07-31 LAB — GLUCOSE, CAPILLARY
GLUCOSE-CAPILLARY: 109 mg/dL — AB (ref 65–99)
Glucose-Capillary: 92 mg/dL (ref 65–99)

## 2017-07-31 MED ORDER — MIDAZOLAM HCL 2 MG/2ML IJ SOLN
INTRAMUSCULAR | Status: DC | PRN
  Administered 2017-07-31: 2 mg via INTRAVENOUS

## 2017-07-31 MED ORDER — ONDANSETRON HCL 4 MG/2ML IJ SOLN
INTRAMUSCULAR | Status: AC
  Administered 2017-07-31: 4 mg via INTRAVENOUS
  Filled 2017-07-31: qty 2

## 2017-07-31 MED ORDER — ONDANSETRON HCL 4 MG/2ML IJ SOLN: 4.0000 mg | Freq: Once | INTRAMUSCULAR | Status: DC | PRN

## 2017-07-31 MED ORDER — SODIUM CHLORIDE 0.9 % IV SOLN
500.0000 mL | Freq: Once | INTRAVENOUS | Status: AC
  Administered 2017-07-31: 09:00:00 via INTRAVENOUS

## 2017-07-31 MED ORDER — METHOHEXITAL SODIUM 100 MG/10ML IV SOSY
PREFILLED_SYRINGE | INTRAVENOUS | Status: DC | PRN
  Administered 2017-07-31: 80 mg via INTRAVENOUS

## 2017-07-31 MED ORDER — KETOROLAC TROMETHAMINE 30 MG/ML IJ SOLN
INTRAMUSCULAR | Status: AC
  Administered 2017-07-31: 30 mg via INTRAVENOUS
  Filled 2017-07-31: qty 1

## 2017-07-31 MED ORDER — MIDAZOLAM HCL 2 MG/2ML IJ SOLN
INTRAMUSCULAR | Status: AC
  Filled 2017-07-31: qty 2

## 2017-07-31 MED ORDER — METHOHEXITAL SODIUM 0.5 G IJ SOLR
INTRAMUSCULAR | Status: AC
  Filled 2017-07-31: qty 500

## 2017-07-31 MED ORDER — KETOROLAC TROMETHAMINE 30 MG/ML IJ SOLN
30.0000 mg | Freq: Once | INTRAMUSCULAR | Status: AC
  Administered 2017-07-31: 30 mg via INTRAVENOUS

## 2017-07-31 MED ORDER — ONDANSETRON HCL 4 MG/2ML IJ SOLN
4.0000 mg | Freq: Once | INTRAMUSCULAR | Status: AC
  Administered 2017-07-31: 4 mg via INTRAVENOUS

## 2017-07-31 MED ORDER — FENTANYL CITRATE (PF) 100 MCG/2ML IJ SOLN: 25.0000 ug | INTRAMUSCULAR | Status: DC | PRN

## 2017-07-31 MED ORDER — SUCCINYLCHOLINE CHLORIDE 200 MG/10ML IV SOSY
PREFILLED_SYRINGE | INTRAVENOUS | Status: DC | PRN
  Administered 2017-07-31: 80 mg via INTRAVENOUS

## 2017-07-31 MED ORDER — SUCCINYLCHOLINE CHLORIDE 20 MG/ML IJ SOLN
INTRAMUSCULAR | Status: AC
  Filled 2017-07-31: qty 1

## 2017-07-31 MED ORDER — MIDAZOLAM HCL 2 MG/2ML IJ SOLN: 2.0000 mg | Freq: Once | INTRAMUSCULAR | Status: DC

## 2017-07-31 MED ORDER — HALOPERIDOL LACTATE 5 MG/ML IJ SOLN
INTRAMUSCULAR | Status: DC | PRN
  Administered 2017-07-31: 5 mg via INTRAVENOUS

## 2017-07-31 NOTE — Anesthesia Post-op Follow-up Note (Signed)
Anesthesia QCDR form completed.        

## 2017-07-31 NOTE — Anesthesia Postprocedure Evaluation (Signed)
Anesthesia Post Note  Patient: Jeanne Hendricks  Procedure(s) Performed: ECT TX  Patient location during evaluation: PACU Anesthesia Type: General Level of consciousness: awake and alert Pain management: pain level controlled Vital Signs Assessment: post-procedure vital signs reviewed and stable Respiratory status: spontaneous breathing and respiratory function stable Cardiovascular status: stable Anesthetic complications: no     Last Vitals:  Vitals:   07/31/17 1045 07/31/17 1054  BP: 138/60 (!) 118/92  Pulse: (!) 101 (!) 107  Resp: 18 (!) 22  Temp: 36.4 C   SpO2: 100% 92%    Last Pain:  Vitals:   07/31/17 1054  TempSrc:   PainSc: 0-No pain                 Abyan Cadman K

## 2017-07-31 NOTE — Transfer of Care (Signed)
Immediate Anesthesia Transfer of Care Note  Patient: Jeanne Hendricks  Procedure(s) Performed: ECT TX  Patient Location: PACU  Anesthesia Type:General  Level of Consciousness: sedated  Airway & Oxygen Therapy: Patient Spontanous Breathing and Patient connected to face mask oxygen  Post-op Assessment: Report given to RN and Post -op Vital signs reviewed and stable  Post vital signs: Reviewed and stable  Last Vitals:  Vitals:   07/31/17 0957 07/31/17 1045  BP: 125/81 138/60  Pulse: 89 (!) 101  Resp:  18  Temp: 36.8 C 36.4 C  SpO2: 98% 100%    Last Pain:  Vitals:   07/31/17 0957  TempSrc: Oral  PainSc:          Complications: No apparent anesthesia complications

## 2017-07-31 NOTE — Procedures (Signed)
ECT SERVICES Physician's Interval Evaluation & Treatment Note  Patient Identification: Jeanne Hendricks MRN:  [id number] Date of Evaluation:  07/31/2017 TX #: 4  MADRS:   MMSE:   P.E. Findings:  No change to physical exam  Psychiatric Interval Note:  Still agitated and intrusive poor insight  Subjective:  Patient is a 47 y.o. female seen for evaluation for Electroconvulsive Therapy. Does not have much insight and does not have any complaint or feel like she is having any problems  Treatment Summary:   [x]   Right Unilateral             []  Bilateral   % Energy : 0.3 ms 75%   Impedance: 1480 ohms  Seizure Energy Index: 5820 V squared  Postictal Suppression Index: 45%  Seizure Concordance Index: 94%  Medications  Pre Shock: Toradol 30 mg Zofran 4 mg Brevital 80 mg succinylcholine 80 mg  Post Shock: Versed 4 mg Haldol 5 mg  Seizure Duration: 32 seconds EMG 63 seconds EEG   Comments: Patient does not want to get any further treatment although I think they have been helpful.  Tentatively scheduled Wednesday.  Lungs:  [x]   Clear to auscultation               []  Other:   Heart:    [x]   Regular rhythm             []  irregular rhythm    [x]   Previous H&P reviewed, patient examined and there are NO CHANGES                 []   Previous H&P reviewed, patient examined and there are changes noted.   Mordecai RasmussenJohn Kelsen Celona, MD 2/25/201910:26 AM

## 2017-07-31 NOTE — Progress Notes (Signed)
Received Jeanne Hendricks this am in her room awake in bed, she is waiting to be transported for her  ECT. She was transported without incident to the PACU. She returned and washed her hair, although she was advised to wait. She has been OOB at intervals without intrusive behavior.

## 2017-07-31 NOTE — Anesthesia Procedure Notes (Signed)
Date/Time: 07/31/2017 10:34 AM Performed by: Lily KocherPeralta, Ruqaya Strauss, CRNA Pre-anesthesia Checklist: Patient identified, Emergency Drugs available, Suction available and Patient being monitored Patient Re-evaluated:Patient Re-evaluated prior to induction Oxygen Delivery Method: Circle system utilized Preoxygenation: Pre-oxygenation with 100% oxygen Induction Type: IV induction Ventilation: Mask ventilation without difficulty and Mask ventilation throughout procedure Airway Equipment and Method: Bite block Placement Confirmation: positive ETCO2 Dental Injury: Teeth and Oropharynx as per pre-operative assessment

## 2017-07-31 NOTE — Anesthesia Preprocedure Evaluation (Signed)
Anesthesia Evaluation  Patient identified by MRN, date of birth, ID band Patient awake    Reviewed: Allergy & Precautions, H&P , NPO status , Patient's Chart, lab work & pertinent test results  Airway Mallampati: II   Neck ROM: full    Dental  (+) Poor Dentition   Pulmonary neg pulmonary ROS, former smoker,    Pulmonary exam normal        Cardiovascular Exercise Tolerance: Good hypertension, On Medications negative cardio ROS Normal cardiovascular exam Rhythm:regular Rate:Normal     Neuro/Psych Anxiety Depression Bipolar Disorder Schizophrenia negative neurological ROS  negative psych ROS   GI/Hepatic negative GI ROS, Neg liver ROS,   Endo/Other  negative endocrine ROSdiabetes, Type 2, Oral Hypoglycemic AgentsHypothyroidism   Renal/GU negative Renal ROS  negative genitourinary   Musculoskeletal   Abdominal   Peds  Hematology negative hematology ROS (+)   Anesthesia Other Findings Past Medical History: No date: Depression No date: Diabetes mellitus without complication (HCC) No date: Hypertension No date: Thyroid disease History reviewed. No pertinent surgical history. BMI    Body Mass Index:  23.63 kg/m     Reproductive/Obstetrics negative OB ROS                             Anesthesia Physical  Anesthesia Plan  ASA: II  Anesthesia Plan: General   Post-op Pain Management:    Induction:   PONV Risk Score and Plan:   Airway Management Planned:   Additional Equipment:   Intra-op Plan:   Post-operative Plan:   Informed Consent: I have reviewed the patients History and Physical, chart, labs and discussed the procedure including the risks, benefits and alternatives for the proposed anesthesia with the patient or authorized representative who has indicated his/her understanding and acceptance.     Plan Discussed with: CRNA  Anesthesia Plan Comments:          Anesthesia Quick Evaluation

## 2017-07-31 NOTE — Progress Notes (Signed)
[facility]  07/31/2017 7:01 PM Jeanne Hendricks  MRN:  [id number] Subjective: Patient had ECT again today.  Treatment without any difficulty.  Affect remains anxious and intrusive.  I spoke with the patient's mother who acknowledges that the patient has had some improvement but at the same time is still not at her baseline.  Patient is not suicidal or homicidal but still has some confusion. Principal Problem: Schizoaffective disorder, bipolar type (HCC) Diagnosis:   Patient Active Problem List   Diagnosis Date Noted  . OCD (obsessive compulsive disorder) [F42.9] 07/06/2017  . Schizoaffective disorder, bipolar type (HCC) [F25.0] 07/04/2017  . HTN (hypertension) [I10] 07/04/2017  . Diabetes (HCC) [E11.9] 07/04/2017  . Hypothyroidism [E03.9] 07/04/2017  . Suicidal ideation [R45.851] 07/04/2017   Total Time spent with patient: 30 minutes  Past Psychiatric History: History of bipolar  Past Medical History:  Past Medical History:  Diagnosis Date  . Depression   . Diabetes mellitus without complication (HCC)   . Hypertension   . Thyroid disease    History reviewed. No pertinent surgical history. Family History: History reviewed. No pertinent family history. Family Psychiatric  History: Anxiety Social History:  Social History   Substance and Sexual Activity  Alcohol Use No  . Frequency: Never     Social History   Substance and Sexual Activity  Drug Use No    Social History   Socioeconomic History  . Marital status: Single    Spouse name: None  . Number of children: None  . Years of education: None  . Highest education level: None  Social Needs  . Financial resource strain: None  . Food insecurity - worry: None  . Food insecurity - inability: None  . Transportation needs - medical: None  . Transportation needs - non-medical: None  Occupational History  . None  Tobacco Use  . Smoking status: Former Games developermoker  . Smokeless tobacco: Former Engineer, waterUser  Substance and  Sexual Activity  . Alcohol use: No    Frequency: Never  . Drug use: No  . Sexual activity: None  Other Topics Concern  . None  Social History Narrative  . None   Additional Social History:                         Sleep: Fair  Appetite:  Poor  Current Medications: Current Facility-Administered Medications  Medication Dose Route Frequency Provider Last Rate Last Dose  . acetaminophen (TYLENOL) tablet 650 mg  650 mg Oral Q6H PRN Pucilowska, Jolanta B, MD   650 mg at 07/12/17 0832  . alum & mag hydroxide-simeth (MAALOX/MYLANTA) 200-200-20 MG/5ML suspension 30 mL  30 mL Oral Q4H PRN Pucilowska, Jolanta B, MD      . atorvastatin (LIPITOR) tablet 10 mg  10 mg Oral q1800 Pucilowska, Jolanta B, MD   10 mg at 07/31/17 1727  . busPIRone (BUSPAR) tablet 15 mg  15 mg Oral TID PRN Aundria Rudakesh, Gopalkumar, MD      . feeding supplement (ENSURE ENLIVE) (ENSURE ENLIVE) liquid 237 mL  237 mL Oral TID BM Pucilowska, Jolanta B, MD   237 mL at 07/30/17 1957  . fluvoxaMINE (LUVOX) tablet 200 mg  200 mg Oral QHS Pucilowska, Jolanta B, MD   200 mg at 07/30/17 2101  . levothyroxine (SYNTHROID, LEVOTHROID) tablet 100 mcg  100 mcg Oral QAC breakfast Pucilowska, Jolanta B, MD   100 mcg at 07/31/17 1131  . magnesium hydroxide (MILK OF MAGNESIA) suspension 30 mL  30 mL Oral Daily PRN Pucilowska, Jolanta B, MD   30 mL at 07/14/17 1719  . midazolam (VERSED) injection 2 mg  2 mg Intravenous Once Clapacs, John T, MD      . midazolam (VERSED) injection 2 mg  2 mg Intravenous Once Clapacs, John T, MD      . midazolam (VERSED) injection 4 mg  4 mg Intravenous Once Clapacs, John T, MD      . midazolam (VERSED) injection 4 mg  4 mg Intravenous Once Clapacs, John T, MD      . QUEtiapine (SEROQUEL) tablet 500 mg  500 mg Oral QHS Clapacs, Jackquline Denmark, MD   500 mg at 07/30/17 2102  . temazepam (RESTORIL) capsule 7.5 mg  7.5 mg Oral QHS Pucilowska, Jolanta B, MD   7.5 mg at 07/30/17 2103    Lab Results:  Results for orders  placed or performed during the hospital encounter of 07/05/17 (from the past 48 hour(s))  Glucose, capillary     Status: None   Collection Time: 07/31/17  5:08 AM  Result Value Ref Range   Glucose-Capillary 92 65 - 99 mg/dL  Glucose, capillary     Status: Abnormal   Collection Time: 07/31/17 10:53 AM  Result Value Ref Range   Glucose-Capillary 109 (H) 65 - 99 mg/dL    Blood Alcohol level:  Lab Results  Component Value Date   ETH <10 07/03/2017    Metabolic Disorder Labs: Lab Results  Component Value Date   HGBA1C 5.1 07/06/2017   MPG 99.67 07/06/2017   No results found for: PROLACTIN Lab Results  Component Value Date   CHOL 103 07/06/2017   TRIG 112 07/06/2017   HDL 39 (L) 07/06/2017   CHOLHDL 2.6 07/06/2017   VLDL 22 07/06/2017   LDLCALC 42 07/06/2017    Physical Findings: AIMS: Facial and Oral Movements Muscles of Facial Expression: None, normal Lips and Perioral Area: None, normal Jaw: None, normal Tongue: None, normal,Extremity Movements Upper (arms, wrists, hands, fingers): None, normal Lower (legs, knees, ankles, toes): None, normal, Trunk Movements Neck, shoulders, hips: None, normal, Overall Severity Severity of abnormal movements (highest score from questions above): None, normal Incapacitation due to abnormal movements: None, normal Patient's awareness of abnormal movements (rate only patient's report): No Awareness, Dental Status Current problems with teeth and/or dentures?: No Does patient usually wear dentures?: No  CIWA:    COWS:     Musculoskeletal: Strength & Muscle Tone: within normal limits Gait & Station: normal Patient leans: N/A  Psychiatric Specialty Exam: Physical Exam  Nursing note and vitals reviewed. Constitutional: She appears well-developed and well-nourished.  HENT:  Head: Normocephalic and atraumatic.  Eyes: Conjunctivae are normal. Pupils are equal, round, and reactive to light.  Neck: Normal range of motion.   Cardiovascular: Regular rhythm and normal heart sounds.  Respiratory: Effort normal. No respiratory distress.  GI: Soft.  Musculoskeletal: Normal range of motion.  Neurological: She is alert.  Skin: Skin is warm and dry.  Psychiatric: Her mood appears anxious. Her affect is labile. Her affect is not blunt. Her speech is rapid and/or pressured. She is agitated. She is not aggressive. Thought content is paranoid. Cognition and memory are impaired. She expresses impulsivity. She expresses no homicidal and no suicidal ideation.    Review of Systems  Constitutional: Negative.   HENT: Negative.   Eyes: Negative.   Respiratory: Negative.   Cardiovascular: Negative.   Gastrointestinal: Negative.   Musculoskeletal: Negative.   Skin: Negative.   Neurological:  Negative.   Psychiatric/Behavioral: Positive for memory loss. Negative for depression, hallucinations, substance abuse and suicidal ideas. The patient is nervous/anxious. The patient does not have insomnia.     Blood pressure 124/86, pulse 99, temperature 98.5 F (36.9 C), resp. rate (!) 21, height 5\' 5"  (1.651 m), weight 64.4 kg (142 lb), SpO2 95 %.Body mass index is 23.63 kg/m.  General Appearance: Casual  Eye Contact:  Fair  Speech:  Pressured  Volume:  Increased  Mood:  Anxious  Affect:  Congruent  Thought Process:  Disorganized  Orientation:  Full (Time, Place, and Person)  Thought Content:  Illogical  Suicidal Thoughts:  No  Homicidal Thoughts:  No  Memory:  Immediate;   Fair Recent;   Fair Remote;   Fair  Judgement:  Impaired  Insight:  Shallow  Psychomotor Activity:  Decreased  Concentration:  Concentration: Fair  Recall:  [employer] of Knowledge:  Fair  Language:  Fair  Akathisia:  No  Handed:  Right  AIMS (if indicated):     Assets:  Desire for Improvement Housing Physical Health  ADL's:  Impaired  Cognition:  Impaired,  Mild  Sleep:  Number of Hours: 7.3     Treatment Plan Summary: Daily contact with  patient to assess and evaluate symptoms and progress in treatment, Medication management and Plan Continue medication continue ECT.  Tolerating higher dose of Seroquel.  Try to keep patient focused on being less intrusive and attending groups and being appropriate.  Spoke with family today.  They are still willing to have her come back but agree that he needs a few more days at least of improvement.  Mordecai Rasmussen, MD 07/31/2017, 7:01 PM

## 2017-07-31 NOTE — H&P (Signed)
Jeanne Hendricks is an 47 y.o. female.   Chief Complaint: Patient continues to be anxious and intrusive HPI: Bipolar disorder with mania and agitation  Past Medical History:  Diagnosis Date  . Depression   . Diabetes mellitus without complication (HCC)   . Hypertension   . Thyroid disease     History reviewed. No pertinent surgical history.  History reviewed. No pertinent family history. Social History:  reports that she has quit smoking. She has quit using smokeless tobacco. She reports that she does not drink alcohol or use drugs.  Allergies:  Allergies  Allergen Reactions  . Bactrim [Sulfamethoxazole-Trimethoprim]   . Compazine [Prochlorperazine Edisylate]     Medications Prior to Admission  Medication Sig Dispense Refill  . atorvastatin (LIPITOR) 10 MG tablet Take 10 mg by mouth at bedtime.    . diclofenac (VOLTAREN) 75 MG EC tablet Take 75 mg by mouth 2 (two) times daily.    Marland Kitchen docusate sodium (COLACE) 100 MG capsule Take 100 mg by mouth daily as needed for mild constipation.    Marland Kitchen levothyroxine (SYNTHROID, LEVOTHROID) 50 MCG tablet Take 50 mcg by mouth daily before breakfast.    . losartan (COZAAR) 50 MG tablet Take 50 mg by mouth daily after breakfast.    . metFORMIN (GLUCOPHAGE) 500 MG tablet Take 500 mg by mouth daily before breakfast.    . mirtazapine (REMERON SOL-TAB) 30 MG disintegrating tablet Take 30 mg by mouth at bedtime.    Marland Kitchen OLANZapine (ZYPREXA) 20 MG tablet Take 20 mg by mouth at bedtime.    . pantoprazole (PROTONIX) 40 MG tablet Take 40 mg by mouth daily.    . propranolol (INDERAL) 20 MG tablet Take 20 mg by mouth 2 (two) times daily.    . QUEtiapine (SEROQUEL) 25 MG tablet Take 25 mg by mouth 2 (two) times daily as needed (panic attacks).    . QUEtiapine (SEROQUEL) 400 MG tablet Take 400 mg by mouth at bedtime.    Marland Kitchen QUEtiapine (SEROQUEL) 50 MG tablet Take 50 mg by mouth 2 (two) times daily as needed (panic attacks).      Results for orders placed or performed  during the hospital encounter of 07/05/17 (from the past 48 hour(s))  Glucose, capillary     Status: None   Collection Time: 07/31/17  5:08 AM  Result Value Ref Range   Glucose-Capillary 92 65 - 99 mg/dL   No results found.  Review of Systems  Constitutional: Negative.   HENT: Negative.   Eyes: Negative.   Respiratory: Negative.   Cardiovascular: Negative.   Gastrointestinal: Negative.   Musculoskeletal: Negative.   Skin: Negative.   Neurological: Negative.   Psychiatric/Behavioral: Positive for memory loss. Negative for depression, hallucinations, substance abuse and suicidal ideas. The patient is nervous/anxious. The patient does not have insomnia.     Blood pressure 125/81, pulse 89, temperature 98.3 F (36.8 C), temperature source Oral, resp. rate 18, height 5\' 5"  (1.651 m), weight 64.4 kg (142 lb), SpO2 98 %. Physical Exam  Nursing note and vitals reviewed. Constitutional: She appears well-developed and well-nourished.  HENT:  Head: Normocephalic and atraumatic.  Eyes: Conjunctivae are normal. Pupils are equal, round, and reactive to light.  Neck: Normal range of motion.  Cardiovascular: Regular rhythm and normal heart sounds.  Respiratory: Effort normal. No respiratory distress.  GI: Soft.  Musculoskeletal: Normal range of motion.  Neurological: She is alert.  Skin: Skin is warm and dry.  Psychiatric: Her mood appears anxious. Her speech is  tangential. She is agitated and slowed. Thought content is paranoid. Cognition and memory are impaired. She expresses impulsivity. She expresses no homicidal and no suicidal ideation.     Assessment/Plan Continue 3 times a week treatment and medication management  Mordecai RasmussenJohn Suprina Mandeville, MD 07/31/2017, 10:21 AM

## 2017-08-01 ENCOUNTER — Other Ambulatory Visit: Payer: Self-pay | Admitting: Psychiatry

## 2017-08-01 MED ORDER — HYDROCORTISONE 1 % EX CREA
TOPICAL_CREAM | Freq: Two times a day (BID) | CUTANEOUS | Status: DC
  Administered 2017-08-02: 18:00:00 via TOPICAL
  Filled 2017-08-01: qty 28

## 2017-08-01 MED ORDER — PNEUMOCOCCAL VAC POLYVALENT 25 MCG/0.5ML IJ INJ
0.5000 mL | INJECTION | INTRAMUSCULAR | Status: DC
  Filled 2017-08-01: qty 0.5

## 2017-08-01 NOTE — Progress Notes (Signed)
D- Patient alert and oriented. Patient presents in a pleasant mood on assessment stating that she slept "good" last night and feels "better". Patient denies SI, HI, AVH, and pain at this time. Patient also denies any signs/symptoms of depression/anxiety. Patient has been doing extremely well with not coming to the nurse's station every thirty minutes to an hour asking for medications, etc. This Clinical research associatewriter voiced to the patient that she seems to be doing much better and patient smiled at this writer seeming to be pleased with this compliment. Patient was asked to fill out her self-inventory sheet and return it to the nurse's station. Patient has not stated a goal for today as of yet.  A- Scheduled medications administered to patient, per MD orders. Support and encouragement provided.  Routine safety checks conducted every 15 minutes.  Patient informed to notify staff with problems or concerns.  R- No adverse drug reactions noted. Patient contracts for safety at this time. Patient compliant with medications and treatment plan. Patient receptive, calm, and cooperative. Patient interacts well with others on the unit.  Patient remains safe at this time.
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
## 2017-08-01 NOTE — [facility] Group Notes (Signed)
  08/01/2017  Time: 0900  Type of Therapy and Topic:  Group Therapy:  Setting Goals Participation Level:  Active  Description of Group: In this process group, patients discussed using strengths to work toward goals and address challenges.  Patients identified two positive things about themselves and one goal they were working on.  Patients were given the opportunity to share openly and support each other's plan for self-empowerment.  The group discussed the value of gratitude and were encouraged to have a daily reflection of positive characteristics or circumstances.  Patients were encouraged to identify a plan to utilize their strengths to work on current challenges and goals.  Therapeutic Goals 1. Patient will verbalize personal strengths/positive qualities and relate how these can assist with achieving desired personal goals 2. Patients will verbalize affirmation of peers plans for personal change and goal setting 3. Patients will explore the value of gratitude and positive focus as related to successful achievement of goals 4. Patients will verbalize a plan for regular reinforcement of personal positive qualities and circumstances.  Summary of Patient Progress: Pt continues to work towards their tx goals but has not yet reached them. Pt was able to appropriately participate in group discussion, and was able to offer support/validation to other group members. Pt reported her goal for today is to, "talk to at least two people in order to feel better by the end of the day."   Therapeutic Modalities Cognitive Behavioral Therapy Motivational Interviewing  Heidi DachKelsey Shalaunda Weatherholtz, MSW, LCSW 08/01/2017 9:40 AM

## 2017-08-01 NOTE — Progress Notes (Signed)
[facility] MD Progress Note  08/01/2017 5:54 PM Jeanne Hendricks  MRN:  [id number] Subjective: Follow-up for 47 year old woman with bipolar disorder.  Patient seen chart reviewed.  Spoke with nursing staff as well.  Patient tells me she is feeling better today.  Mood is not as agitated.  Nursing also tells me she has been behaving much better today.  Not nearly as hyper focused on trivial issues or agitated or invasive.  Nevertheless she is still excitable and a little too intrusive with me but does not appear to be acutely dangerous.  She is willing to accept the idea that she is not quite ready for discharge without getting angry about it.  She does mention to me that she is having a rash on her face which I do notice. Principal Problem: Schizoaffective disorder, bipolar type (HCC) Diagnosis:   Patient Active Problem List   Diagnosis Date Noted  . OCD (obsessive compulsive disorder) [F42.9] 07/06/2017  . Schizoaffective disorder, bipolar type (HCC) [F25.0] 07/04/2017  . HTN (hypertension) [I10] 07/04/2017  . Diabetes (HCC) [E11.9] 07/04/2017  . Hypothyroidism [E03.9] 07/04/2017  . Suicidal ideation [R45.851] 07/04/2017   Total Time spent with patient: 30 minutes  Past Psychiatric History: Patient has a history of bipolar disorder and has been in the hospital for this stay for several weeks now.  Past Medical History:  Past Medical History:  Diagnosis Date  . Depression   . Diabetes mellitus without complication (HCC)   . Hypertension   . Thyroid disease    History reviewed. No pertinent surgical history. Family History: History reviewed. No pertinent family history. Family Psychiatric  History: Unknown Social History:  Social History   Substance and Sexual Activity  Alcohol Use No  . Frequency: Never     Social History   Substance and Sexual Activity  Drug Use No    Social History   Socioeconomic History  . Marital status: Single    Spouse name: None  . Number of children: None   . Years of education: None  . Highest education level: None  Social Needs  . Financial resource strain: None  . Food insecurity - worry: None  . Food insecurity - inability: None  . Transportation needs - medical: None  . Transportation needs - non-medical: None  Occupational History  . None  Tobacco Use  . Smoking status: Former Games developer  . Smokeless tobacco: Former Engineer, water and Sexual Activity  . Alcohol use: No    Frequency: Never  . Drug use: No  . Sexual activity: None  Other Topics Concern  . None  Social History Narrative  . None   Additional Social History:                         Sleep: Fair  Appetite:  Fair  Current Medications: Current Facility-Administered Medications  Medication Dose Route Frequency Provider Last Rate Last Dose  . acetaminophen (TYLENOL) tablet 650 mg  650 mg Oral Q6H PRN Pucilowska, Jolanta B, MD   650 mg at 07/12/17 0832  . alum & mag hydroxide-simeth (MAALOX/MYLANTA) 200-200-20 MG/5ML suspension 30 mL  30 mL Oral Q4H PRN Pucilowska, Jolanta B, MD      . atorvastatin (LIPITOR) tablet 10 mg  10 mg Oral q1800 Pucilowska, Jolanta B, MD   10 mg at 08/01/17 1742  . busPIRone (BUSPAR) tablet 15 mg  15 mg Oral TID PRN Aundria Rud, MD      . feeding supplement (  ENSURE ENLIVE) (ENSURE ENLIVE) liquid 237 mL  237 mL Oral TID BM Pucilowska, Jolanta B, MD   237 mL at 07/30/17 1957  . fluvoxaMINE (LUVOX) tablet 200 mg  200 mg Oral QHS Pucilowska, Jolanta B, MD   200 mg at 07/31/17 1940  . levothyroxine (SYNTHROID, LEVOTHROID) tablet 100 mcg  100 mcg Oral QAC breakfast Pucilowska, Jolanta B, MD   100 mcg at 08/01/17 0849  . magnesium hydroxide (MILK OF MAGNESIA) suspension 30 mL  30 mL Oral Daily PRN Pucilowska, Jolanta B, MD   30 mL at 07/14/17 1719  . midazolam (VERSED) injection 2 mg  2 mg Intravenous Once ,  T, MD      . midazolam (VERSED) injection 2 mg  2 mg Intravenous Once ,  T, MD      . midazolam  (VERSED) injection 4 mg  4 mg Intravenous Once ,  T, MD      . midazolam (VERSED) injection 4 mg  4 mg Intravenous Once ,  T, MD      . QUEtiapine (SEROQUEL) tablet 500 mg  500 mg Oral QHS ,  T, MD   500 mg at 07/31/17 1941  . temazepam (RESTORIL) capsule 7.5 mg  7.5 mg Oral QHS Pucilowska, Jolanta B, MD   7.5 mg at 07/31/17 1940    Lab Results:  Results for orders placed or performed during the hospital encounter of 07/05/17 (from the past 48 hour(s))  Glucose, capillary     Status: None   Collection Time: 07/31/17  5:08 AM  Result Value Ref Range   Glucose-Capillary 92 65 - 99 mg/dL  Glucose, capillary     Status: Abnormal   Collection Time: 07/31/17 10:53 AM  Result Value Ref Range   Glucose-Capillary 109 (H) 65 - 99 mg/dL    Blood Alcohol level:  Lab Results  Component Value Date   ETH <10 07/03/2017    Metabolic Disorder Labs: Lab Results  Component Value Date   HGBA1C 5.1 07/06/2017   MPG 99.67 07/06/2017   No results found for: PROLACTIN Lab Results  Component Value Date   CHOL 103 07/06/2017   TRIG 112 07/06/2017   HDL 39 (L) 07/06/2017   CHOLHDL 2.6 07/06/2017   VLDL 22 07/06/2017   LDLCALC 42 07/06/2017    Physical Findings: AIMS: Facial and Oral Movements Muscles of Facial Expression: None, normal Lips and Perioral Area: None, normal Jaw: None, normal Tongue: None, normal,Extremity Movements Upper (arms, wrists, hands, fingers): None, normal Lower (legs, knees, ankles, toes): None, normal, Trunk Movements Neck, shoulders, hips: None, normal, Overall Severity Severity of abnormal movements (highest score from questions above): None, normal Incapacitation due to abnormal movements: None, normal Patient's awareness of abnormal movements (rate only patient's report): No Awareness, Dental Status Current problems with teeth and/or dentures?: No Does patient usually wear dentures?: No  CIWA:    COWS:      Musculoskeletal: Strength & Muscle Tone: within normal limits Gait & Station: normal Patient leans: N/A  Psychiatric Specialty Exam: Physical Exam  Nursing note and vitals reviewed. Constitutional: She appears well-developed and well-nourished.  HENT:  Head: Normocephalic and atraumatic.  Eyes: Conjunctivae are normal. Pupils are equal, round, and reactive to light.  Neck: Normal range of motion.  Cardiovascular: Regular rhythm and normal heart sounds.  Respiratory: Effort normal. No respiratory distress.  GI: Soft.  Musculoskeletal: Normal range of motion.  Neurological: She is alert.  Skin: Skin is warm and dry. Rash noted.  Psychiatric: Her  mood appears anxious. Her speech is rapid and/or pressured. She is agitated. She is not aggressive. Thought content is not paranoid. She expresses impulsivity. She expresses no homicidal and no suicidal ideation. She exhibits abnormal recent memory.    Review of Systems  Constitutional: Negative.   HENT: Negative.   Eyes: Negative.   Respiratory: Negative.   Cardiovascular: Negative.   Gastrointestinal: Negative.   Musculoskeletal: Negative.   Skin: Negative.   Neurological: Negative.   Psychiatric/Behavioral: Negative for depression, hallucinations, memory loss, substance abuse and suicidal ideas. The patient is nervous/anxious. The patient does not have insomnia.     Blood pressure 116/77, pulse 92, temperature (!) 97.5 F (36.4 C), temperature source Oral, resp. rate 20, height 5\' 5"  (1.651 m), weight 64.4 kg (142 lb), SpO2 100 %.Body mass index is 23.63 kg/m.  General Appearance: Casual  Eye Contact:  Good  Speech:  Clear and Coherent  Volume:  Normal  Mood:  Anxious  Affect:  Congruent  Thought Process:  Goal Directed  Orientation:  Full (Time, Place, and Person)  Thought Content:  Logical, Rumination and Tangential  Suicidal Thoughts:  No  Homicidal Thoughts:  No  Memory:  Immediate;   Fair Recent;   Fair Remote;    Fair  Judgement:  Fair  Insight:  Fair  Psychomotor Activity:  Increased  Concentration:  Concentration: Fair  Recall:  [employer]  Fund of Knowledge:  Fair  Language:  Fair  Akathisia:  No  Handed:  Right  AIMS (if indicated):     Assets:  Desire for Improvement Housing Physical Health Resilience  ADL's:  Intact  Cognition:  WNL  Sleep:  Number of Hours: 8     Treatment Plan Summary: Daily contact with patient to assess and evaluate symptoms and progress in treatment, Medication management and Plan Patient finally seems to be getting some very clear improvement.  No change to medicine other than adding some hydrocortisone for her face.  Next ECT treatment tomorrow.  May be getting within 1-2 days of discharge.  Mordecai RasmussenJohn , MD 08/01/2017, 5:54 PM

## 2017-08-01 NOTE — [facility] Group Notes (Signed)
[facility] Group Notes:  (Nursing/MHT/Case Management/Adjunct)  Date:  08/01/2017  Time:  3:22 PM  Type of Therapy:  Psychoeducational Skills  Participation Level:  Active  Participation Quality:  Resistant  Affect:  Flat  Cognitive:  Appropriate  Insight:  Improving  Engagement in Group:  Improving and Limited  Modes of Intervention:  Discussion and Education  Summary of Progress/Problems:  Jeanne PleasureKemiya L Jakaree Hendricks 08/01/2017, 3:22 PM

## 2017-08-01 NOTE — Progress Notes (Signed)
Patient denies SI/HI/AVH. Patient is very cooperative and pleasant during assessment. Patient has no complaints at this time. Patient's safety is maintained. Patient compliant with medications.

## 2017-08-01 NOTE — [facility] Group Notes (Signed)
08/01/2017 1PM  Type of Therapy/Topic:  Group Therapy:  Feelings about Diagnosis  Participation Level:  Active   Description of Group:   This group will allow patients to explore their thoughts and feelings about diagnoses they have received. Patients will be guided to explore their level of understanding and acceptance of these diagnoses. Facilitator will encourage patients to process their thoughts and feelings about the reactions of others to their diagnosis and will guide patients in identifying ways to discuss their diagnosis with significant others in their lives. This group will be process-oriented, with patients participating in exploration of their own experiences, giving and receiving support, and processing challenge from other group members.   Therapeutic Goals: 1. Patient will demonstrate understanding of diagnosis as evidenced by identifying two or more symptoms of the disorder 2. Patient will be able to express two feelings regarding the diagnosis 3. Patient will demonstrate their ability to communicate their needs through discussion and/or role play  Summary of Patient Progress: Actively and appropriately engaged in the group. Patient was able to provide support and validation to other group members.Patient practiced active listening when interacting with the facilitator and other group members Patient in still in the process of obtaining treatment goals. Jeanne Hendricks says that she has panic attacks and mood swings in relation to her diagnosis. She says that she she initially found out about her diagnosis she was "angry". Patient agrees with the suggestion to practice more self care in her every day life.     Therapeutic Modalities:   Cognitive Behavioral Therapy Brief Therapy Feelings Identification    Johny ShearsCassandra  Elah Avellino, LCSW 08/01/2017 1:52 PM

## 2017-08-02 ENCOUNTER — Inpatient Hospital Stay: Payer: Medicaid Other | Admitting: Anesthesiology

## 2017-08-02 LAB — GLUCOSE, CAPILLARY: Glucose-Capillary: 85 mg/dL (ref 65–99)

## 2017-08-02 MED ORDER — METFORMIN HCL 500 MG PO TABS: 500.0000 mg | ORAL_TABLET | Freq: Every day | ORAL | 0 refills | Status: DC

## 2017-08-02 MED ORDER — LOSARTAN POTASSIUM 50 MG PO TABS: 50.0000 mg | ORAL_TABLET | Freq: Every day | ORAL | 0 refills | Status: DC

## 2017-08-02 MED ORDER — SODIUM CHLORIDE 0.9 % IV SOLN
INTRAVENOUS | Status: DC | PRN
  Administered 2017-08-02: 08:00:00 via INTRAVENOUS

## 2017-08-02 MED ORDER — KETOROLAC TROMETHAMINE 30 MG/ML IJ SOLN
INTRAMUSCULAR | Status: AC
  Administered 2017-08-02: 30 mg via INTRAVENOUS
  Filled 2017-08-02: qty 1

## 2017-08-02 MED ORDER — DICLOFENAC SODIUM 75 MG PO TBEC: 75.0000 mg | DELAYED_RELEASE_TABLET | Freq: Two times a day (BID) | ORAL | 0 refills | Status: DC

## 2017-08-02 MED ORDER — SUCCINYLCHOLINE CHLORIDE 20 MG/ML IJ SOLN
INTRAMUSCULAR | Status: AC
  Filled 2017-08-02: qty 1

## 2017-08-02 MED ORDER — MIDAZOLAM HCL 2 MG/2ML IJ SOLN
INTRAMUSCULAR | Status: AC
  Filled 2017-08-02: qty 2

## 2017-08-02 MED ORDER — SODIUM CHLORIDE 0.9 % IV SOLN
500.0000 mL | Freq: Once | INTRAVENOUS | Status: AC
  Administered 2017-08-02: 500 mL via INTRAVENOUS

## 2017-08-02 MED ORDER — OLANZAPINE 20 MG PO TABS: 20.0000 mg | ORAL_TABLET | Freq: Every day | ORAL | 0 refills | Status: DC

## 2017-08-02 MED ORDER — MIDAZOLAM HCL 2 MG/2ML IJ SOLN
INTRAMUSCULAR | Status: DC | PRN
  Administered 2017-08-02: 2 mg via INTRAVENOUS

## 2017-08-02 MED ORDER — MIDAZOLAM HCL 2 MG/2ML IJ SOLN: 2.0000 mg | Freq: Once | INTRAMUSCULAR | Status: DC

## 2017-08-02 MED ORDER — PROPRANOLOL HCL 20 MG PO TABS: 20.0000 mg | ORAL_TABLET | Freq: Two times a day (BID) | ORAL | 0 refills | Status: DC

## 2017-08-02 MED ORDER — METHOHEXITAL SODIUM 100 MG/10ML IV SOSY
PREFILLED_SYRINGE | INTRAVENOUS | Status: DC | PRN
  Administered 2017-08-02: 80 mg via INTRAVENOUS

## 2017-08-02 MED ORDER — PANTOPRAZOLE SODIUM 40 MG PO TBEC: 40.0000 mg | DELAYED_RELEASE_TABLET | Freq: Every day | ORAL | 0 refills | Status: DC

## 2017-08-02 MED ORDER — LEVOTHYROXINE SODIUM 50 MCG PO TABS: 50.0000 ug | ORAL_TABLET | Freq: Every day | ORAL | 0 refills | Status: DC

## 2017-08-02 MED ORDER — HALOPERIDOL LACTATE 5 MG/ML IJ SOLN
INTRAMUSCULAR | Status: DC | PRN
  Administered 2017-08-02: 5 mg via INTRAVENOUS

## 2017-08-02 MED ORDER — ONDANSETRON HCL 4 MG/2ML IJ SOLN
INTRAMUSCULAR | Status: AC
  Administered 2017-08-02: 4 mg via INTRAVENOUS
  Filled 2017-08-02: qty 2

## 2017-08-02 MED ORDER — KETOROLAC TROMETHAMINE 30 MG/ML IJ SOLN
30.0000 mg | Freq: Once | INTRAMUSCULAR | Status: AC
  Administered 2017-08-02: 30 mg via INTRAVENOUS

## 2017-08-02 MED ORDER — ATORVASTATIN CALCIUM 10 MG PO TABS: 10.0000 mg | ORAL_TABLET | Freq: Every day | ORAL | 0 refills | Status: DC

## 2017-08-02 MED ORDER — HALOPERIDOL LACTATE 5 MG/ML IJ SOLN
INTRAMUSCULAR | Status: AC
  Filled 2017-08-02: qty 1

## 2017-08-02 MED ORDER — SUCCINYLCHOLINE CHLORIDE 200 MG/10ML IV SOSY
PREFILLED_SYRINGE | INTRAVENOUS | Status: DC | PRN
  Administered 2017-08-02: 80 mg via INTRAVENOUS

## 2017-08-02 MED ORDER — HYDROCORTISONE 1 % EX CREA: TOPICAL_CREAM | Freq: Two times a day (BID) | CUTANEOUS | 0 refills | Status: DC

## 2017-08-02 MED ORDER — QUETIAPINE FUMARATE 400 MG PO TABS: 400.0000 mg | ORAL_TABLET | Freq: Every day | ORAL | 0 refills | Status: DC

## 2017-08-02 MED ORDER — ONDANSETRON HCL 4 MG/2ML IJ SOLN
4.0000 mg | Freq: Once | INTRAMUSCULAR | Status: AC
  Administered 2017-08-02: 4 mg via INTRAVENOUS

## 2017-08-02 MED ORDER — FLUVOXAMINE MALEATE 100 MG PO TABS: 200.0000 mg | ORAL_TABLET | Freq: Every day | ORAL | 0 refills | Status: DC

## 2017-08-02 MED ORDER — TEMAZEPAM 7.5 MG PO CAPS: 7.5000 mg | ORAL_CAPSULE | Freq: Every day | ORAL | 0 refills | Status: DC

## 2017-08-02 NOTE — Progress Notes (Signed)
[facility]  08/02/2017 8:02 PM Jeanne Hendricks  MRN:  [id number] Subjective: Follow-up for 47 year old woman with bipolar disorder.  Patient had ECT today which was done without any complication.  She is looking markedly better.  She is able to hold a conversation very commonly without getting too intrusive.  She is not pacing around constantly.  Not showing labile bizarre mood.  Denies suicidal thoughts.  Does not have evident delusions.  Patient seems to have had a great deal of improvement and no longer appears to be acutely dangerous. Principal Problem: Schizoaffective disorder, bipolar type (HCC) Diagnosis:   Patient Active Problem List   Diagnosis Date Noted  . OCD (obsessive compulsive disorder) [F42.9] 07/06/2017  . Schizoaffective disorder, bipolar type (HCC) [F25.0] 07/04/2017  . HTN (hypertension) [I10] 07/04/2017  . Diabetes (HCC) [E11.9] 07/04/2017  . Hypothyroidism [E03.9] 07/04/2017  . Suicidal ideation [R45.851] 07/04/2017   Total Time spent with patient: 30 minutes  Past Psychiatric History: Past history of bipolar  Past Medical History:  Past Medical History:  Diagnosis Date  . Depression   . Diabetes mellitus without complication (HCC)   . Hypertension   . Thyroid disease    History reviewed. No pertinent surgical history. Family History: History reviewed. No pertinent family history. Family Psychiatric  History: Unknown Social History:  Social History   Substance and Sexual Activity  Alcohol Use No  . Frequency: Never     Social History   Substance and Sexual Activity  Drug Use No    Social History   Socioeconomic History  . Marital status: Single    Spouse name: None  . Number of children: None  . Years of education: None  . Highest education level: None  Social Needs  . Financial resource strain: None  . Food insecurity - worry: None  . Food insecurity - inability: None  . Transportation needs - medical: None  . Transportation needs  - non-medical: None  Occupational History  . None  Tobacco Use  . Smoking status: Former Games developermoker  . Smokeless tobacco: Former Engineer, waterUser  Substance and Sexual Activity  . Alcohol use: No    Frequency: Never  . Drug use: No  . Sexual activity: None  Other Topics Concern  . None  Social History Narrative  . None   Additional Social History:                         Sleep: Fair  Appetite:  Fair  Current Medications: Current Facility-Administered Medications  Medication Dose Route Frequency Provider Last Rate Last Dose  . acetaminophen (TYLENOL) tablet 650 mg  650 mg Oral Q6H PRN Pucilowska, Jolanta B, MD   650 mg at 07/12/17 0832  . alum & mag hydroxide-simeth (MAALOX/MYLANTA) 200-200-20 MG/5ML suspension 30 mL  30 mL Oral Q4H PRN Pucilowska, Jolanta B, MD      . atorvastatin (LIPITOR) tablet 10 mg  10 mg Oral q1800 Pucilowska, Jolanta B, MD   10 mg at 08/02/17 1758  . busPIRone (BUSPAR) tablet 15 mg  15 mg Oral TID PRN Aundria Rudakesh, Gopalkumar, MD      . feeding supplement (ENSURE ENLIVE) (ENSURE ENLIVE) liquid 237 mL  237 mL Oral TID BM Pucilowska, Jolanta B, MD   237 mL at 08/01/17 2047  . fluvoxaMINE (LUVOX) tablet 200 mg  200 mg Oral QHS Pucilowska, Jolanta B, MD   200 mg at 08/01/17 2045  . hydrocortisone cream 1 %   Topical  BID Jozeph Persing T, MD      . levothyroxine (SYNTHROID, LEVOTHROID) tablet 100 mcg  100 mcg Oral QAC breakfast Pucilowska, Jolanta B, MD   100 mcg at 08/02/17 1223  . magnesium hydroxide (MILK OF MAGNESIA) suspension 30 mL  30 mL Oral Daily PRN Pucilowska, Jolanta B, MD   30 mL at 07/14/17 1719  . midazolam (VERSED) injection 2 mg  2 mg Intravenous Once Josephine Rudnick T, MD      . midazolam (VERSED) injection 2 mg  2 mg Intravenous Once Kenetha Cozza T, MD      . midazolam (VERSED) injection 2 mg  2 mg Intravenous Once Stela Iwasaki T, MD      . midazolam (VERSED) injection 4 mg  4 mg Intravenous Once Qamar Rosman T, MD      . midazolam (VERSED) injection 4  mg  4 mg Intravenous Once Kitai Purdom T, MD      . pneumococcal 23 valent vaccine (PNU-IMMUNE) injection 0.5 mL  0.5 mL Intramuscular Tomorrow-1000 Trenesha Alcaide T, MD      . QUEtiapine (SEROQUEL) tablet 500 mg  500 mg Oral QHS Husain Costabile T, MD   500 mg at 08/01/17 2046  . temazepam (RESTORIL) capsule 7.5 mg  7.5 mg Oral QHS Pucilowska, Jolanta B, MD   7.5 mg at 08/01/17 2045    Lab Results:  Results for orders placed or performed during the hospital encounter of 07/05/17 (from the past 48 hour(s))  Glucose, capillary     Status: None   Collection Time: 08/02/17  6:20 AM  Result Value Ref Range   Glucose-Capillary 85 65 - 99 mg/dL    Blood Alcohol level:  Lab Results  Component Value Date   ETH <10 07/03/2017    Metabolic Disorder Labs: Lab Results  Component Value Date   HGBA1C 5.1 07/06/2017   MPG 99.67 07/06/2017   No results found for: PROLACTIN Lab Results  Component Value Date   CHOL 103 07/06/2017   TRIG 112 07/06/2017   HDL 39 (L) 07/06/2017   CHOLHDL 2.6 07/06/2017   VLDL 22 07/06/2017   LDLCALC 42 07/06/2017    Physical Findings: AIMS: Facial and Oral Movements Muscles of Facial Expression: None, normal Lips and Perioral Area: None, normal Jaw: None, normal Tongue: None, normal,Extremity Movements Upper (arms, wrists, hands, fingers): None, normal Lower (legs, knees, ankles, toes): None, normal, Trunk Movements Neck, shoulders, hips: None, normal, Overall Severity Severity of abnormal movements (highest score from questions above): None, normal Incapacitation due to abnormal movements: None, normal Patient's awareness of abnormal movements (rate only patient's report): No Awareness, Dental Status Current problems with teeth and/or dentures?: No Does patient usually wear dentures?: No  CIWA:    COWS:     Musculoskeletal: Strength & Muscle Tone: within normal limits Gait & Station: normal Patient leans: N/A  Psychiatric Specialty Exam: Physical  Exam  Nursing note and vitals reviewed. Constitutional: She appears well-developed and well-nourished.  HENT:  Head: Normocephalic and atraumatic.  Eyes: Conjunctivae are normal. Pupils are equal, round, and reactive to light.  Neck: Normal range of motion.  Cardiovascular: Regular rhythm and normal heart sounds.  Respiratory: Effort normal. No respiratory distress.  GI: Soft.  Musculoskeletal: Normal range of motion.  Neurological: She is alert.  Skin: Skin is warm and dry.  Psychiatric: She has a normal mood and affect. Her speech is normal and behavior is normal. Judgment and thought content normal. Cognition and memory are normal.  Review of Systems  Constitutional: Negative.   HENT: Negative.   Eyes: Negative.   Respiratory: Negative.   Cardiovascular: Negative.   Gastrointestinal: Negative.   Musculoskeletal: Negative.   Skin: Negative.   Neurological: Negative.   Psychiatric/Behavioral: Negative.     Blood pressure 116/85, pulse 81, temperature 98.4 F (36.9 C), temperature source Oral, resp. rate 18, height 5\' 5"  (1.651 m), weight 64.4 kg (142 lb), SpO2 93 %.Body mass index is 23.63 kg/m.  General Appearance: Casual  Eye Contact:  Good  Speech:  Clear and Coherent  Volume:  Decreased  Mood:  Euthymic  Affect:  Constricted  Thought Process:  Coherent  Orientation:  Full (Time, Place, and Person)  Thought Content:  Logical  Suicidal Thoughts:  No  Homicidal Thoughts:  No  Memory:  Immediate;   Fair Recent;   Fair Remote;   Fair  Judgement:  Fair  Insight:  Fair  Psychomotor Activity:  Decreased  Concentration:  Concentration: Fair  Recall:  [employer] of Knowledge:  Fair  Language:  Fair  Akathisia:  No  Handed:  Right  AIMS (if indicated):     Assets:  Desire for Improvement Financial Resources/Insurance Housing Physical Health  ADL's:  Intact  Cognition:  WNL  Sleep:  Number of Hours: 8     Treatment Plan Summary: Daily contact with patient  to assess and evaluate symptoms and progress in treatment, Medication management and Plan Patient finally seems to have improved to the point that she can be discharged.  Talk with the patient at some length today about it.  One complication may be that it is unclear if insurance has yet been put in place.  I suspect that it has not.  We will have to talk with social work tomorrow.  If not we will not be able to do outpatient ECT.  I have gone ahead and ordered a 7-day supply of her medicine for now.  We will complete the discharge plan tomorrow.  Patient understands.  I tried to reach her mother by telephone and left a voicemail message.  Mordecai Rasmussen, MD 08/02/2017, 8:02 PM

## 2017-08-02 NOTE — Anesthesia Preprocedure Evaluation (Signed)
Anesthesia Evaluation  Patient identified by MRN, date of birth, ID band Patient awake    Reviewed: Allergy & Precautions, H&P , NPO status , Patient's Chart, lab work & pertinent test results  Airway Mallampati: II   Neck ROM: full    Dental  (+) Poor Dentition   Pulmonary neg pulmonary ROS, former smoker,    Pulmonary exam normal        Cardiovascular Exercise Tolerance: Good hypertension, On Medications negative cardio ROS Normal cardiovascular exam Rhythm:regular Rate:Normal     Neuro/Psych Anxiety Depression Bipolar Disorder Schizophrenia negative neurological ROS  negative psych ROS   GI/Hepatic negative GI ROS, Neg liver ROS,   Endo/Other  negative endocrine ROSdiabetes, Type 2, Oral Hypoglycemic AgentsHypothyroidism   Renal/GU negative Renal ROS  negative genitourinary   Musculoskeletal   Abdominal   Peds  Hematology negative hematology ROS (+)   Anesthesia Other Findings Past Medical History: No date: Depression No date: Diabetes mellitus without complication (HCC) No date: Hypertension No date: Thyroid disease History reviewed. No pertinent surgical history. BMI    Body Mass Index:  23.63 kg/m     Reproductive/Obstetrics negative OB ROS                             Anesthesia Physical  Anesthesia Plan  ASA: II  Anesthesia Plan: General   Post-op Pain Management:    Induction:   PONV Risk Score and Plan:   Airway Management Planned:   Additional Equipment:   Intra-op Plan:   Post-operative Plan:   Informed Consent: I have reviewed the patients History and Physical, chart, labs and discussed the procedure including the risks, benefits and alternatives for the proposed anesthesia with the patient or authorized representative who has indicated his/her understanding and acceptance.     Plan Discussed with: CRNA  Anesthesia Plan Comments:          Anesthesia Quick Evaluation  

## 2017-08-02 NOTE — Anesthesia Postprocedure Evaluation (Signed)
Anesthesia Post Note  Patient: Jeanne Hendricks  Procedure(s) Performed: ECT TX  Patient location during evaluation: PACU Anesthesia Type: General Level of consciousness: awake and alert Pain management: pain level controlled Vital Signs Assessment: post-procedure vital signs reviewed and stable Respiratory status: spontaneous breathing, nonlabored ventilation, respiratory function stable and patient connected to nasal cannula oxygen Cardiovascular status: blood pressure returned to baseline and stable Postop Assessment: no apparent nausea or vomiting Anesthetic complications: no     Last Vitals:  Vitals:   08/02/17 1200 08/02/17 1205  BP:  105/73  Pulse: (!) 104 (!) 103  Resp: 18   Temp:  (!) 36.2 C  SpO2: 96% 93%    Last Pain:  Vitals:   08/02/17 1205  TempSrc:   PainSc: 0-No pain                 Lenard SimmerAndrew Markeshia Giebel

## 2017-08-02 NOTE — Progress Notes (Signed)
Patient is in milieu and has visitor. Patient is pleasant. Patient denies SI/HI/AVH. Patient inquires about possibility of discharge, patient also make statements of improvement in thought pattern and behavior. Patient is reassured and safety maintained at this time.

## 2017-08-02 NOTE — Procedures (Signed)
ECT SERVICES Physician's Interval Evaluation & Treatment Note  Patient Identification: Jeanne Hendricks MRN:  [id number] Date of Evaluation:  08/02/2017 TX #: 6  MADRS:   MMSE:   P.E. Findings:  No change to physical exam  Psychiatric Interval Note:  Mood is improved much calmer and less intrusive  Subjective:  Patient is a 47 y.o. female seen for evaluation for Electroconvulsive Therapy. No specific complaint  Treatment Summary:   [x]   Right Unilateral             []  Bilateral   % Energy : 0.3 ms 75%   Impedance: 1550 ohms  Seizure Energy Index: 90,842 V squared  Postictal Suppression Index: No accurate reading  Seizure Concordance Index: 78%  Medications  Pre Shock: Toradol 30 mg Zofran 4 mg Brevital 80 mg succinylcholine 80 mg  Post Shock: Versed 4 mg Haldol 5 mg  Seizure Duration: 29 seconds EMG 58 seconds EEG   Comments: Patient is doing much better and we are likely to end the index course as of today.  Lungs:  [x]   Clear to auscultation               []  Other:   Heart:    [x]   Regular rhythm             []  irregular rhythm    [x]   Previous H&P reviewed, patient examined and there are NO CHANGES                 []   Previous H&P reviewed, patient examined and there are changes noted.   Mordecai RasmussenJohn Tonea Leiphart, MD 2/27/201911:27 AM

## 2017-08-02 NOTE — Plan of Care (Signed)
  Progressing Spiritual Needs Ability to function at adequate level 08/02/2017 1729 - Progressing by Elige Radonobb, Caran Storck B, RN Note Maintains personal care chores appropriately.  Good appetite.   Activity: Interest or engagement in leisure activities will improve 08/02/2017 1729 - Progressing by Elige Radonobb, Danaria Larsen B, RN Note Visible in the milieu.  Attending groups.   Education: Knowledge of the prescribed therapeutic regimen will improve 08/02/2017 1729 - Progressing by Elige Radonobb, Dian Laprade B, RN Note Verbalizes names of medication, when they are due and what they are for.   Coping: Ability to cope will improve 08/02/2017 1729 - Progressing by Elige Radonobb, Adelena Desantiago B, RN Note Denies anxiety.  Able to attends groups.  Verbalizing feelings.   Health Behavior/Discharge Planning: Ability to make decisions will improve 08/02/2017 1729 - Progressing by Elige Radonobb, Lachrisha Ziebarth B, RN Compliance with therapeutic regimen will improve 08/02/2017 1729 - Progressing by Elige Radonobb, Zoriyah Scheidegger B, RN Note Compliant with therapeutic reigiment.   Role Relationship: Ability to demonstrate positive changes in social behaviors and relationships will improve 08/02/2017 1729 - Progressing by Elige Radonobb, Eriyonna Matsushita B, RN Note Pleasant and cooperative.  No intrusive behaviors exhibits this shift.  No aggressive verbal communication  Safety: Ability to disclose and discuss suicidal ideas will improve 08/02/2017 1729 - Progressing by Elige Radonobb, Lylliana Kitamura B, RN Note Denies SI

## 2017-08-02 NOTE — [facility] Group Notes (Signed)
  08/02/2017  Time: 1:00PM  Type of Therapy/Topic:  Group Therapy:  Emotion Regulation  Participation Level:  Active   Description of Group:    The purpose of this group is to assist patients in learning to regulate negative emotions and experience positive emotions. Patients will be guided to discuss ways in which they have been vulnerable to their negative emotions. These vulnerabilities will be juxtaposed with experiences of positive emotions or situations, and patients will be challenged to use positive emotions to combat negative ones. Special emphasis will be placed on coping with negative emotions in conflict situations, and patients will process healthy conflict resolution skills.  Therapeutic Goals: 1. Patient will identify two positive emotions or experiences to reflect on in order to balance out negative emotions 2. Patient will label two or more emotions that they find the most difficult to experience 3. Patient will demonstrate positive conflict resolution skills through discussion and/or role plays  Summary of Patient Progress: Pt continues to work towards their tx goals but has not yet reached them. Pt was able to appropriately participate in group discussion, and was able to offer support/validation to other group members. Pt reported feeling, "good today. I don't know why." Pt reported one way she can stay safe/calm in the moment is to, "yell."   Therapeutic Modalities:   Cognitive Behavioral Therapy Feelings Identification Dialectical Behavioral Therapy  Heidi DachKelsey Rodolphe Edmonston, MSW, LCSW 08/02/2017 1:55 PM

## 2017-08-02 NOTE — Anesthesia Post-op Follow-up Note (Signed)
Anesthesia QCDR form completed.        

## 2017-08-02 NOTE — Transfer of Care (Signed)
Immediate Anesthesia Transfer of Care Note  Patient: Jeanne Hendricks  Procedure(s) Performed: ECT TX  Patient Location: PACU  Anesthesia Type:General  Level of Consciousness: sedated  Airway & Oxygen Therapy: Patient Spontanous Breathing and Patient connected to face mask oxygen  Post-op Assessment: Report given to RN and Post -op Vital signs reviewed and stable  Post vital signs: Reviewed and stable  Last Vitals:  Vitals:   08/02/17 0950 08/02/17 1142  BP: 124/85 (!) 135/91  Pulse: 78 98  Resp:  15  Temp: 36.5 C (!) 36.2 C  SpO2: 98% 100%    Last Pain:  Vitals:   08/02/17 1142  TempSrc:   PainSc: Asleep         Complications: No apparent anesthesia complications

## 2017-08-02 NOTE — H&P (Signed)
Jeanne Hendricks is an 47 y.o. female.   Chief Complaint: Feeling significantly better.  Much calmer. HPI: History of bipolar disorder recently with manic episode improved  Past Medical History:  Diagnosis Date  . Depression   . Diabetes mellitus without complication (HCC)   . Hypertension   . Thyroid disease     History reviewed. No pertinent surgical history.  History reviewed. No pertinent family history. Social History:  reports that she has quit smoking. She has quit using smokeless tobacco. She reports that she does not drink alcohol or use drugs.  Allergies:  Allergies  Allergen Reactions  . Bactrim [Sulfamethoxazole-Trimethoprim]   . Compazine [Prochlorperazine Edisylate]     Medications Prior to Admission  Medication Sig Dispense Refill  . atorvastatin (LIPITOR) 10 MG tablet Take 10 mg by mouth at bedtime.    . diclofenac (VOLTAREN) 75 MG EC tablet Take 75 mg by mouth 2 (two) times daily.    Marland Kitchen docusate sodium (COLACE) 100 MG capsule Take 100 mg by mouth daily as needed for mild constipation.    Marland Kitchen levothyroxine (SYNTHROID, LEVOTHROID) 50 MCG tablet Take 50 mcg by mouth daily before breakfast.    . losartan (COZAAR) 50 MG tablet Take 50 mg by mouth daily after breakfast.    . metFORMIN (GLUCOPHAGE) 500 MG tablet Take 500 mg by mouth daily before breakfast.    . mirtazapine (REMERON SOL-TAB) 30 MG disintegrating tablet Take 30 mg by mouth at bedtime.    Marland Kitchen OLANZapine (ZYPREXA) 20 MG tablet Take 20 mg by mouth at bedtime.    . pantoprazole (PROTONIX) 40 MG tablet Take 40 mg by mouth daily.    . propranolol (INDERAL) 20 MG tablet Take 20 mg by mouth 2 (two) times daily.    . QUEtiapine (SEROQUEL) 25 MG tablet Take 25 mg by mouth 2 (two) times daily as needed (panic attacks).    . QUEtiapine (SEROQUEL) 400 MG tablet Take 400 mg by mouth at bedtime.    Marland Kitchen QUEtiapine (SEROQUEL) 50 MG tablet Take 50 mg by mouth 2 (two) times daily as needed (panic attacks).      Results for  orders placed or performed during the hospital encounter of 07/05/17 (from the past 48 hour(s))  Glucose, capillary     Status: None   Collection Time: 08/02/17  6:20 AM  Result Value Ref Range   Glucose-Capillary 85 65 - 99 mg/dL   No results found.  Review of Systems  Constitutional: Negative.   HENT: Negative.   Eyes: Negative.   Respiratory: Negative.   Cardiovascular: Negative.   Gastrointestinal: Negative.   Musculoskeletal: Negative.   Skin: Negative.   Neurological: Negative.   Psychiatric/Behavioral: Negative.     Blood pressure 124/85, pulse 78, temperature 97.7 F (36.5 C), temperature source Oral, resp. rate 18, height 5\' 5"  (1.651 m), weight 64.4 kg (142 lb), SpO2 98 %. Physical Exam  Nursing note and vitals reviewed. Constitutional: She appears well-developed and well-nourished.  HENT:  Head: Normocephalic and atraumatic.  Eyes: Conjunctivae are normal. Pupils are equal, round, and reactive to light.  Neck: Normal range of motion.  Cardiovascular: Regular rhythm and normal heart sounds.  Respiratory: Effort normal. No respiratory distress.  GI: Soft.  Musculoskeletal: Normal range of motion.  Neurological: She is alert.  Skin: Skin is warm and dry.  Psychiatric: Judgment and thought content normal. Her affect is blunt. Her speech is delayed. She is slowed. Cognition and memory are impaired.     Assessment/Plan Likely  this will be our last treatment on the index course and we will follow this up only if possible with maintenance  Mordecai RasmussenJohn Joncarlos Atkison, MD 08/02/2017, 11:26 AM

## 2017-08-02 NOTE — Anesthesia Procedure Notes (Signed)
Date/Time: 08/02/2017 11:32 AM Performed by: Lily KocherPeralta, Rubens Cranston, CRNA Pre-anesthesia Checklist: Patient identified, Emergency Drugs available, Suction available and Patient being monitored Patient Re-evaluated:Patient Re-evaluated prior to induction Oxygen Delivery Method: Circle system utilized Preoxygenation: Pre-oxygenation with 100% oxygen Induction Type: IV induction Ventilation: Mask ventilation without difficulty and Mask ventilation throughout procedure Airway Equipment and Method: Bite block Placement Confirmation: positive ETCO2 Dental Injury: Teeth and Oropharynx as per pre-operative assessment

## 2017-08-02 NOTE — Progress Notes (Signed)
Returned to room via wheelchair.

## 2017-08-02 NOTE — Anesthesia Postprocedure Evaluation (Signed)
Anesthesia Post Note  Patient: Jeanne Hendricks  Procedure(s) Performed: ECT TX  Patient location during evaluation: PACU Anesthesia Type: General Level of consciousness: awake and alert Pain management: pain level controlled Vital Signs Assessment: post-procedure vital signs reviewed and stable Respiratory status: spontaneous breathing, nonlabored ventilation, respiratory function stable and patient connected to nasal cannula oxygen Cardiovascular status: blood pressure returned to baseline and stable Postop Assessment: no apparent nausea or vomiting Anesthetic complications: no     Last Vitals:  Vitals:   08/02/17 1200 08/02/17 1205  BP:  105/73  Pulse: (!) 104 (!) 103  Resp: 18   Temp:  (!) 36.2 C  SpO2: 96% 93%    Last Pain:  Vitals:   08/02/17 1205  TempSrc:   PainSc: 0-No pain                 Yevette EdwardsJames G Adams

## 2017-08-03 MED ORDER — FLUVOXAMINE MALEATE 100 MG PO TABS: 200.0000 mg | ORAL_TABLET | Freq: Every day | ORAL | 1 refills | Status: AC

## 2017-08-03 MED ORDER — ATORVASTATIN CALCIUM 10 MG PO TABS: 10.0000 mg | ORAL_TABLET | Freq: Every day | ORAL | 1 refills | Status: AC

## 2017-08-03 MED ORDER — FLUVOXAMINE MALEATE 100 MG PO TABS: 200.0000 mg | ORAL_TABLET | Freq: Every day | ORAL | 1 refills | Status: DC

## 2017-08-03 MED ORDER — LEVOTHYROXINE SODIUM 50 MCG PO TABS: 50.0000 ug | ORAL_TABLET | Freq: Every day | ORAL | 1 refills | Status: AC

## 2017-08-03 MED ORDER — TEMAZEPAM 7.5 MG PO CAPS: 7.5000 mg | ORAL_CAPSULE | Freq: Every day | ORAL | 1 refills | Status: AC

## 2017-08-03 MED ORDER — HYDROCORTISONE 1 % EX CREA: TOPICAL_CREAM | Freq: Two times a day (BID) | CUTANEOUS | 1 refills | Status: AC

## 2017-08-03 MED ORDER — QUETIAPINE FUMARATE 300 MG PO TABS: 600.0000 mg | ORAL_TABLET | Freq: Every day | ORAL | 1 refills | Status: AC

## 2017-08-03 NOTE — Progress Notes (Signed)
Received Jeanne Hendricks this am in her room asleep, She woke up to eat and received her medication. He behavior is appropriate and she is calm. She received her discharge order.The AVS was reviewed and her questions. Her personal belongings returned. She was discharged with her mom and dad without incident.

## 2017-08-03 NOTE — Progress Notes (Signed)
Patient ID: Jeanne Hendricks, female   DOB: [birth year], 47 y.o.   MRN: [id number] CSW contacted pt's mother, [name], to inquire about pt having Medicaid benefits.  [name] informed CSW that pt did not have Medicaid yet as she was still working on get the required documentation together.  CSW informed [name] that pt may be discharged today and that a follow-up appointment would be scheduled with [facility] [place].  CSW informed [name] that she would follow-up with her after speaking with pt's attending psychiatrist.  CSW spoke with pt's psychiatrist, Dr. Toni Amendlapacs regarding her discharge plans.  Dr. Toni Amendlapacs shared that he is okay with her discharging today.  CSW informed Dr. Toni Amendlapacs that after speaking with pt's mother, Governor Rooksat Hodgins, CSW was informed that pt does not have Medicaid established yet.  Dr. Toni Amendlapacs shared that he would have liked for pt to have on-going maintenance ECT, but he would not be able to provide this without pt having insurance.  CSW informed Dr. Toni Amendlapacs that she would get a f/u appointment scheduled with a provider in pt's community.   CSW followed back up with pt's mother, [name], to inform her that pt would be able to be discharged today.  [name] shared that her husband has to drive and they would be picking pt up around 2pm this afternoon.  CSW informed pt that she would be discharging today so she should work on getting her belongings packed following lunch today.  CSW gave pt the packet of information on applying for SSDI benefits to pt who will give to her mother.  CS[facility] and was able to schedule a f/u appointment for pt for 08/07/17 at 8:30AM.

## 2017-08-03 NOTE — BHH Group Notes (Signed)
08/03/2017  Time: 1PM  Type of Therapy/Topic:  Group Therapy:  Balance in Life  Participation Level:  Minimal  Description of Group:   This group will address the concept of balance and how it feels and looks when one is unbalanced. Patients will be encouraged to process areas in their lives that are out of balance and identify reasons for remaining unbalanced. Facilitators will guide patients in utilizing problem-solving interventions to address and correct the stressor making their life unbalanced. Understanding and applying boundaries will be explored and addressed for obtaining and maintaining a balanced life. Patients will be encouraged to explore ways to assertively make their unbalanced needs known to significant others in their lives, using other group members and facilitator for support and feedback.  Therapeutic Goals: 1. Patient will identify two or more emotions or situations they have that consume much of in their lives. 2. Patient will identify signs/triggers that life has become out of balance:  3. Patient will identify two ways to set boundaries in order to achieve balance in their lives:  4. Patient will demonstrate ability to communicate their needs through discussion and/or role plays  Summary of Patient Progress: Pt continues to work towards their tx goals but has not yet reached them. Pt was able to appropriately participate in group discussion, and was able to offer support/validation to other group members. Pt reported she was not sure what area of her life she would like to devote more attention to. Pt reported she would like to achieve a better balance in life by, "sleeping better."   Therapeutic Modalities:   Cognitive Behavioral Therapy Solution-Focused Therapy Assertiveness Training  Heidi DachKelsey Marjon Doxtater, MSW, LCSW 08/03/2017 2:04 PM

## 2017-08-03 NOTE — BHH Group Notes (Signed)
  08/03/2017 9am  Type of Therapy and Topic: Group Therapy: Goals Group: SMART Goals   Participation Level: Active  Description of Group:    The purpose of a daily goals group is to assist and guide patients in setting recovery/wellness-related goals. The objective is to set goals as they relate to the crisis in which they were admitted. Patients will be using SMART goal modalities to set measurable goals. Characteristics of realistic goals will be discussed and patients will be assisted in setting and processing how one will reach their goal. Facilitator will also assist patients in applying interventions and coping skills learned in psycho-education groups to the SMART goal and process how one will achieve defined goal.   Therapeutic Goals:   -Patients will develop and document one goal related to or their crisis in which brought them into treatment.  -Patients will be guided by LCSW using SMART goal setting modality in how to set a measurable, attainable, realistic and time sensitive goal.  -Patients will process barriers in reaching goal.  -Patients will process interventions in how to overcome and successful in reaching goal.   Patient's Goal: "To be discharged today."  Therapeutic Modalities:  Motivational Interviewing  Cognitive Behavioral Therapy  Crisis Intervention Model  SMART goals setting  Johny ShearsCassandra  Yerlin Gasparyan, LCSW 08/03/2017 9:44 AM

## 2017-08-03 NOTE — BHH Suicide Risk Assessment (Signed)
[facility]   Principal Problem: Schizoaffective disorder, bipolar type [facility](HCC) Discharge Diagnoses:  Patient Active Problem List   Diagnosis Date Noted  . OCD (obsessive compulsive disorder) [F42.9] 07/06/2017  . Schizoaffective disorder, bipolar type (HCC) [F25.0] 07/04/2017  . HTN (hypertension) [I10] 07/04/2017  . Diabetes (HCC) [E11.9] 07/04/2017  . Hypothyroidism [E03.9] 07/04/2017  . Suicidal ideation [R45.851] 07/04/2017    Total Time spent with patient: 30 minutes  Musculoskeletal: Strength & Muscle Tone: within normal limits Gait & Station: normal Patient leans: N/A  Psychiatric Specialty Exam: Review of Systems  Constitutional: Negative.   HENT: Negative.   Eyes: Negative.   Respiratory: Negative.   Cardiovascular: Negative.   Gastrointestinal: Negative.   Musculoskeletal: Negative.   Skin: Negative.   Neurological: Negative.   Psychiatric/Behavioral: Negative.     Blood pressure 139/71, pulse 98, temperature 98.2 F (36.8 C), temperature source Oral, resp. rate 18, height 5\' 5"  (1.651 m), weight 64.4 kg (142 lb), SpO2 100 %.Body mass index is 23.63 kg/m.  General Appearance: Casual  Eye Contact::  Good  Speech:  Clear and Coherent409  Volume:  Normal  Mood:  Euthymic  Affect:  Congruent  Thought Process:  Goal Directed  Orientation:  Full (Time, Place, and Person)  Thought Content:  Logical  Suicidal Thoughts:  No  Homicidal Thoughts:  No  Memory:  Immediate;   Fair Recent;   Fair Remote;   Fair  Judgement:  Fair  Insight:  Fair  Psychomotor Activity:  Decreased  Concentration:  Fair  Recall:  F[employer]Fair  Fund of Knowledge:Fair  Language: Fair  Akathisia:  No  Handed:  Right  AIMS (if indicated):     Assets:  Desire for Improvement Housing Physical Health Resilience  Sleep:  Number of Hours: 7  Cognition: Impaired,  Mild  ADL's:  Intact   Mental Status Per Nursing Assessment::   On Admission:     Demographic  Factors:  Caucasian  Loss Factors: Financial problems/change in socioeconomic status  Historical Factors: Impulsivity  Risk Reduction Factors:   Responsible for children under 48 years of age, Sense of responsibility to family, Religious beliefs about death, Living with another person, especially a relative and Positive social support  Continued Clinical Symptoms:  Bipolar Disorder:   Mixed State  Cognitive Features That Contribute To Risk:  Loss of executive function    Suicide Risk:  Minimal: No identifiable suicidal ideation.  Patients presenting with no risk factors but with morbid ruminations; may be classified as minimal risk based on the severity of the depressive symptoms  Follow-up Information    [facility] Follow up.   Why:  TBD Contact information: [address] [place] [address] [phone]           Plan Of Care/Follow-up recommendations:  Activity:  as tolerated Diet:  regular Other:  [facility]  John Clapacs, MD 08/03/2017, 11:12 AM

## 2017-08-03 NOTE — Progress Notes (Signed)
  [facility] Discharge Plan :  Will you be returning to the same living situation after discharge:  Yes,  Pt will be returning home with her parents and child. At discharge, do you have transportation home?: Yes,  Pt's parents will be picking her up at discharge. Do you have the ability to pay for your medications: Yes,  Pt will be receiving financial assistance and support from her parents.  Release of information consent forms completed and in the chart;  Patient's signature needed at discharge.  Patient to Follow up at: Follow-up Information    [facility] Follow up.   Why:  TBD Contact information: [address] [place] [phone]           Next level of care provider has access to [facility] Link:no  Safety Planning and Suicide Prevention discussed: Yes,  No safety concerns noted.     Has patient been referred to the Quitline?: N/A patient is not a smoker  Patient has been referred for addiction treatment: N/A  Alease FrameSonya S Jaysen Wey, LCSW 08/03/2017, 9:59 AM

## 2017-08-04 NOTE — Discharge Summary (Signed)
Physician Discharge Summary Note  Patient:  Jeanne Hendricks is an 47 y.o., female MRN:  [id number] DOB:  Apr 26, 1971 Patient phone:  [phone] (home)  Patient address:   [address] [place] [address],  Total Time spent with patient: 45 minutes  Date of Admission:  07/05/2017 Date of Discharge: August 03, 2017  Reason for Admission: Patient was admitted to the hospital after presenting with disorganized agitated behavior consistent with psychotic bipolar disorder.  Principal Problem: Schizoaffective disorder, bipolar type [facility](HCC) Discharge Diagnoses: Patient Active Problem List   Diagnosis Date Noted  . OCD (obsessive compulsive disorder) [F42.9] 07/06/2017  . Schizoaffective disorder, bipolar type (HCC) [F25.0] 07/04/2017  . HTN (hypertension) [I10] 07/04/2017  . Diabetes (HCC) [E11.9] 07/04/2017  . Hypothyroidism [E03.9] 07/04/2017  . Suicidal ideation [R45.851] 07/04/2017    Past Psychiatric History: Patient reports a past history of bipolar disorder with previous hospitalizations.  Several prior medications.  No known past history of suicide attempts.  Possible past history of obsessive-compulsive disorder.  Past Medical History:  Past Medical History:  Diagnosis Date  . Depression   . Diabetes mellitus without complication (HCC)   . Hypertension   . Thyroid disease    History reviewed. No pertinent surgical history. Family History: History reviewed. No pertinent family history. Family Psychiatric  History: Unknown Social History:  Social History   Substance and Sexual Activity  Alcohol Use No  . Frequency: Never     Social History   Substance and Sexual Activity  Drug Use No    Social History   Socioeconomic History  . Marital status: Single    Spouse name: None  . Number of children: None  . Years of education: None  . Highest education level: None  Social Needs  . Financial resource strain: None  . Food insecurity - worry: None  . Food  insecurity - inability: None  . Transportation needs - medical: None  . Transportation needs - non-medical: None  Occupational History  . None  Tobacco Use  . Smoking status: Former Games developermoker  . Smokeless tobacco: Former Engineer, waterUser  Substance and Sexual Activity  . Alcohol use: No    Frequency: Never  . Drug use: No  . Sexual activity: None  Other Topics Concern  . None  Social History Narrative  . None    Hospital Course: Patient was admitted to the psychiatric service.  She was treated with medication and therapy and initially with very little improvement.  She remains disorganized very intrusive agitated.  Not violent to others but clearly impaired not able to participate often in groups with labile mood.  Ultimately I was requested to see her for ECT treatment.  Patient received 6 right unilateral ECT treatments.  Showed significant improvement.  Medications were adjusted to get her down to just 1 antipsychotic and antidepressant.  By the time of discharge patient was lucid and no longer intrusive.  Able to hold a reasonable conversation.  Taking care of her ADLs.  No longer dangerous.  Agreeable to outpatient treatment.  Physical Findings: AIMS: Facial and Oral Movements Muscles of Facial Expression: None, normal Lips and Perioral Area: None, normal Jaw: None, normal Tongue: None, normal,Extremity Movements Upper (arms, wrists, hands, fingers): None, normal Lower (legs, knees, ankles, toes): None, normal, Trunk Movements Neck, shoulders, hips: None, normal, Overall Severity Severity of abnormal movements (highest score from questions above): None, normal Incapacitation due to abnormal movements: None, normal Patient's awareness of abnormal movements (rate only patient's report): No Awareness, Dental  Status Current problems with teeth and/or dentures?: No Does patient usually wear dentures?: No  CIWA:    COWS:     Musculoskeletal: Strength & Muscle Tone: within normal limits Gait &  Station: normal Patient leans: N/A  Psychiatric Specialty Exam: Physical Exam  Nursing note and vitals reviewed. Constitutional: She appears well-developed and well-nourished.  HENT:  Head: Normocephalic and atraumatic.  Eyes: Conjunctivae are normal. Pupils are equal, round, and reactive to light.  Neck: Normal range of motion.  Cardiovascular: Regular rhythm and normal heart sounds.  Respiratory: Effort normal. No respiratory distress.  GI: Soft.  Musculoskeletal: Normal range of motion.  Neurological: She is alert.  Skin: Skin is warm and dry.  Psychiatric: She has a normal mood and affect. Judgment normal. Her speech is delayed. She is slowed. Thought content is not paranoid. Cognition and memory are impaired. She expresses no homicidal and no suicidal ideation.    Review of Systems  Constitutional: Negative.   HENT: Negative.   Eyes: Negative.   Respiratory: Negative.   Cardiovascular: Negative.   Gastrointestinal: Negative.   Musculoskeletal: Negative.   Skin: Negative.   Neurological: Negative.   Psychiatric/Behavioral: Positive for memory loss. Negative for depression, hallucinations, substance abuse and suicidal ideas. The patient is not nervous/anxious and does not have insomnia.     Blood pressure 139/71, pulse 98, temperature 98.2 F (36.8 C), temperature source Oral, resp. rate 18, height 5\' 5"  (1.651 m), weight 64.4 kg (142 lb), SpO2 100 %.Body mass index is 23.63 kg/m.  General Appearance: Casual  Eye Contact:  Fair  Speech:  Normal Rate  Volume:  Normal  Mood:  Euthymic  Affect:  Constricted  Thought Process:  Goal Directed  Orientation:  Full (Time, Place, and Person)  Thought Content:  Logical  Suicidal Thoughts:  No  Homicidal Thoughts:  No  Memory:  Immediate;   Fair Recent;   Fair Remote;   Fair  Judgement:  Fair  Insight:  Fair  Psychomotor Activity:  Decreased  Concentration:  Concentration: Fair  Recall:  Fair  Fund of Knowledge:  Fair   Language:  Fair  Akathisia:  No  Handed:  Right  AIMS (if indicated):     Assets:  Desire for Improvement Housing Physical Health Resilience  ADL's:  Intact  Cognition:  Impaired,  Mild  Sleep:  Number of Hours: 7        Has this patient used any form of tobacco in the last 30 days? (Cigarettes, Smokeless Tobacco, Cigars, and/or Pipes) Yes, No  Blood Alcohol level:  Lab Results  Component Value Date   ETH <10 07/03/2017    Metabolic Disorder Labs:  Lab Results  Component Value Date   HGBA1C 5.1 07/06/2017   MPG 99.67 07/06/2017   No results found for: PROLACTIN Lab Results  Component Value Date   CHOL 103 07/06/2017   TRIG 112 07/06/2017   HDL 39 (L) 07/06/2017   CHOLHDL 2.6 07/06/2017   VLDL 22 07/06/2017   LDLCALC 42 07/06/2017    See Psychiatric Specialty Exam and Suicide Risk Assessment completed by Attending Physician prior to discharge.  Discharge destination:  Home  Is patient on multiple antipsychotic therapies at discharge:  No   Has Patient had three or more failed trials of antipsychotic monotherapy by history:  No  Recommended Plan for Multiple Antipsychotic Therapies: NA  Discharge Instructions    Diet - low sodium heart healthy   Complete by:  As directed    Increase activity  slowly   Complete by:  As directed      Allergies as of 08/03/2017      Reactions   Bactrim [sulfamethoxazole-trimethoprim]    Compazine [prochlorperazine Edisylate]       Medication List    STOP taking these medications   diclofenac 75 MG EC tablet Commonly known as:  VOLTAREN   docusate sodium 100 MG capsule Commonly known as:  COLACE   losartan 50 MG tablet Commonly known as:  COZAAR   metFORMIN 500 MG tablet Commonly known as:  GLUCOPHAGE   mirtazapine 30 MG disintegrating tablet Commonly known as:  REMERON SOL-TAB   OLANZapine 20 MG tablet Commonly known as:  ZYPREXA   pantoprazole 40 MG tablet Commonly known as:  PROTONIX   propranolol 20  MG tablet Commonly known as:  INDERAL     TAKE these medications     Indication  atorvastatin 10 MG tablet Commonly known as:  LIPITOR Take 1 tablet (10 mg total) by mouth daily at 6 PM. What changed:  when to take this  Indication:  High Amount of Fats in the Blood   fluvoxaMINE 100 MG tablet Commonly known as:  LUVOX Take 2 tablets (200 mg total) by mouth at bedtime.  Indication:  Obsessive Compulsive Disorder   hydrocortisone cream 1 % Apply topically 2 (two) times daily.  Indication:  Inflammation   levothyroxine 50 MCG tablet Commonly known as:  SYNTHROID, LEVOTHROID Take 1 tablet (50 mcg total) by mouth daily before breakfast.  Indication:  Underactive Thyroid   QUEtiapine 300 MG tablet Commonly known as:  SEROQUEL Take 2 tablets (600 mg total) by mouth at bedtime. What changed:    medication strength  how much to take  when to take this  reasons to take this  Another medication with the same name was removed. Continue taking this medication, and follow the directions you see here.  Indication:  Manic Phase of Manic-Depression   temazepam 7.5 MG capsule Commonly known as:  RESTORIL Take 1 capsule (7.5 mg total) by mouth at bedtime.  Indication:  Trouble Sleeping      Follow-up Information    [facility] Follow up on 08/07/2017.   Why:  Your appointment is scheduled for 8:30 AM.  You will be meeting with Jacqualyn Posey, Referral Coordinator.  Please arrive 15 minutes early to complete paperwork.  Thank you. Contact information: [address] [place] [address] [phone]           Follow-up recommendations:  Activity:  Activity as tolerated Diet:  Regular diet Other:  Follow-up with [facility] with continued medication management.  Comments: Spoke with the patient's mother several times during hospitalization.  Patient has a safe place to live and appropriate outpatient care should be available.  Signed: Mordecai Rasmussen, MD 08/04/2017, 5:19 PM

## 2017-08-18 ENCOUNTER — Ambulatory Visit: Payer: Self-pay | Admitting: Family Medicine

## 2019-08-21 IMAGING — CT CT HEAD W/O CM
3 series · 16 of 47 positions shown, 19 images · non-contrast
Comparison: None.

CLINICAL DATA: Altered mental status

EXAM:
CT HEAD WITHOUT CONTRAST
TECHNIQUE: Contiguous axial images were obtained from the base of the skull
through the vertex without intravenous contrast.

[Series 2: head wo · axial · 0.47mm/px · z∈[-156,-21]mm · 10 of 33 slices shown, 13 images]
[im 3/33  brain]
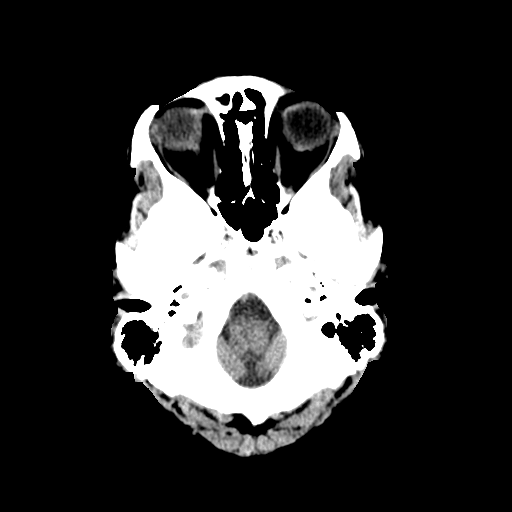
[im 3/33  bone]
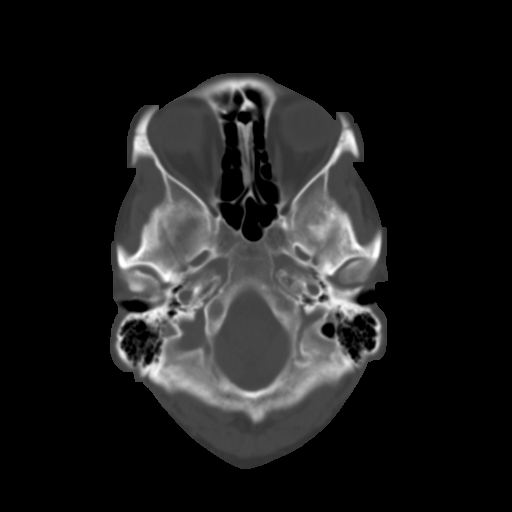
[im 6/33  brain]
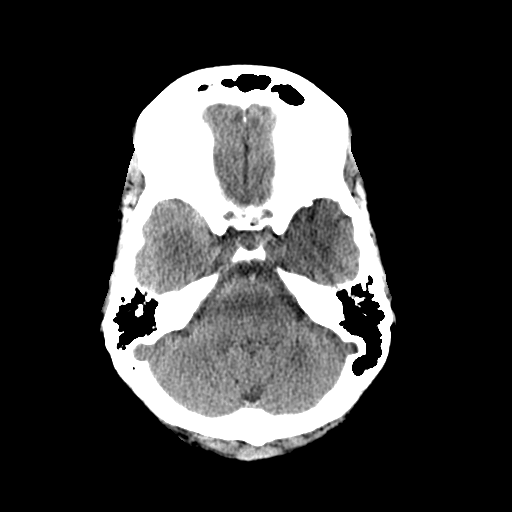
[im 9/33  brain]
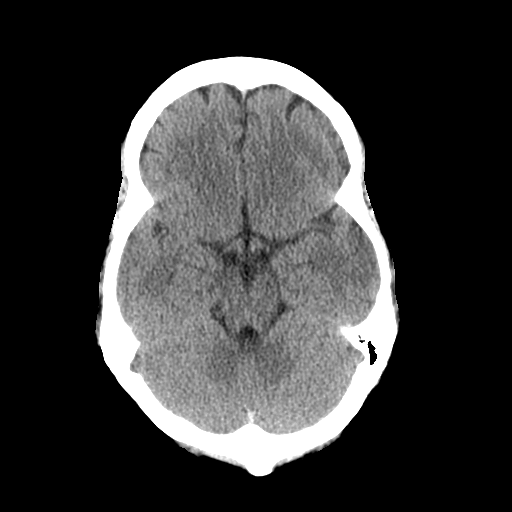
[im 12/33  brain]
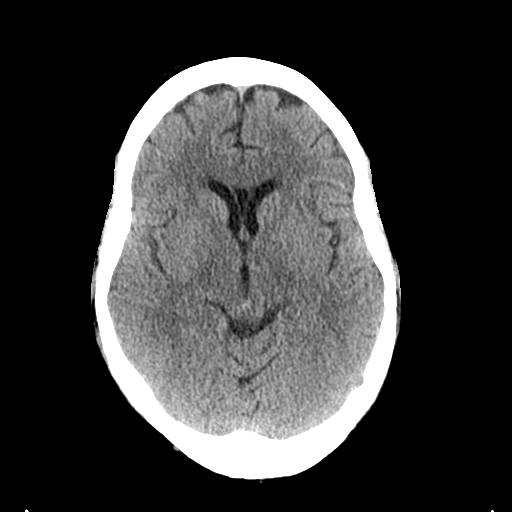
[im 15/33  brain]
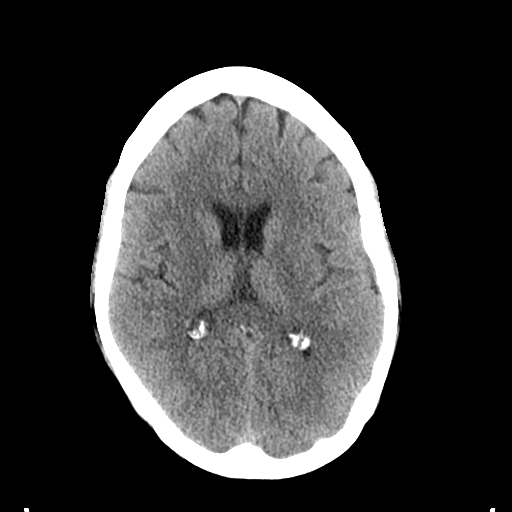
[im 15/33  bone]
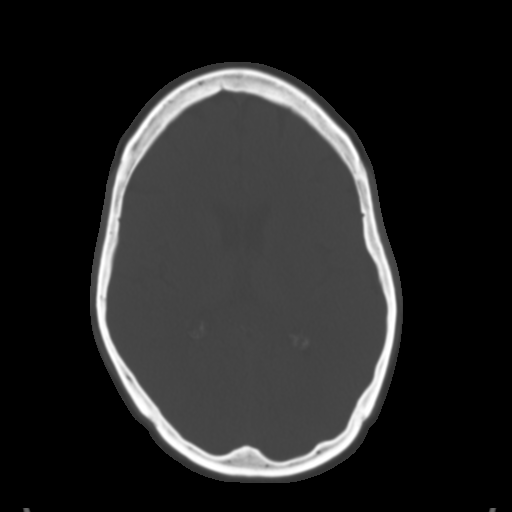
[im 18/33  brain]
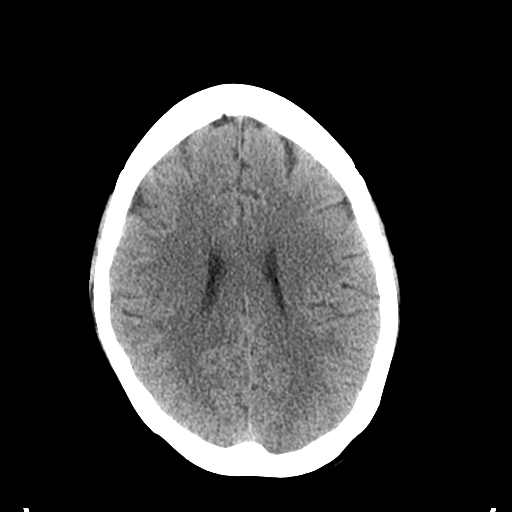
[im 21/33  brain]
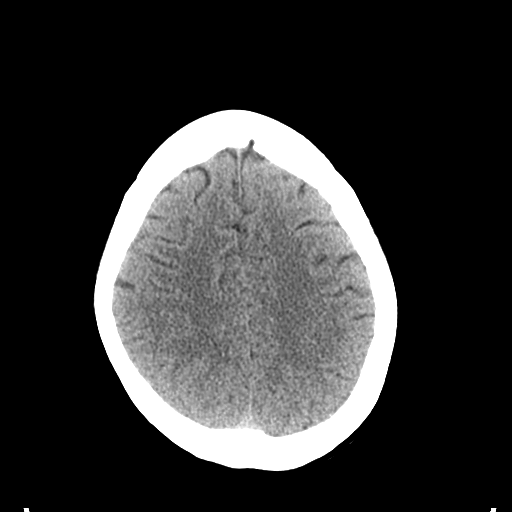
[im 25/33  brain]
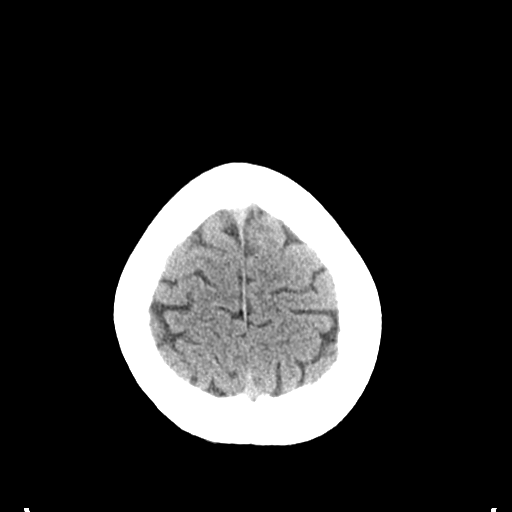
[im 27/33  brain]
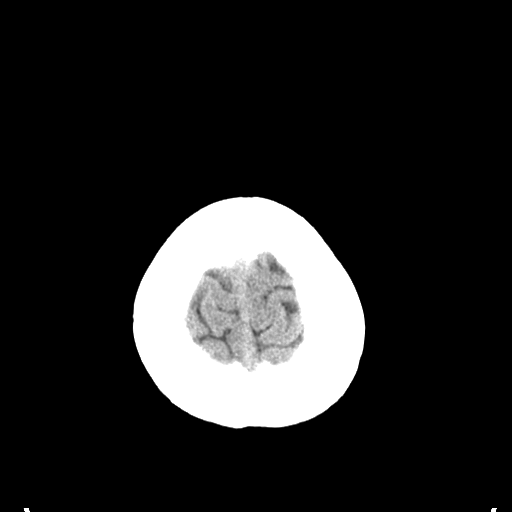
[im 27/33  bone]
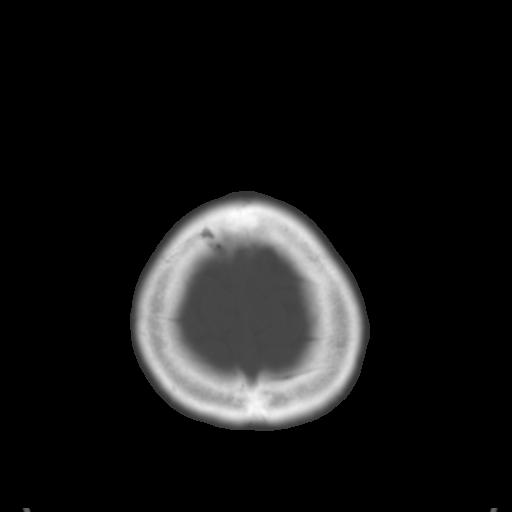
[im 30/33  brain]
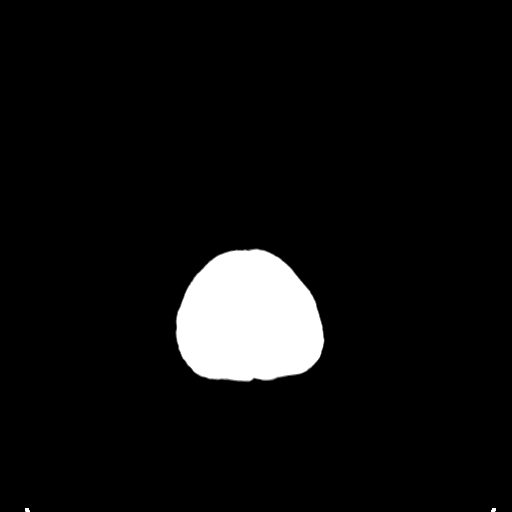

[Series 4: coronal soft tissue · coronal · 0.31mm/px · 3 of 70 slices shown]
[im 24/70  brain]
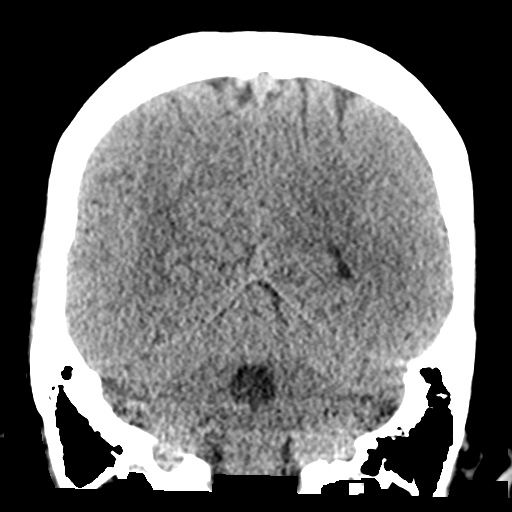
[im 31/70  brain]
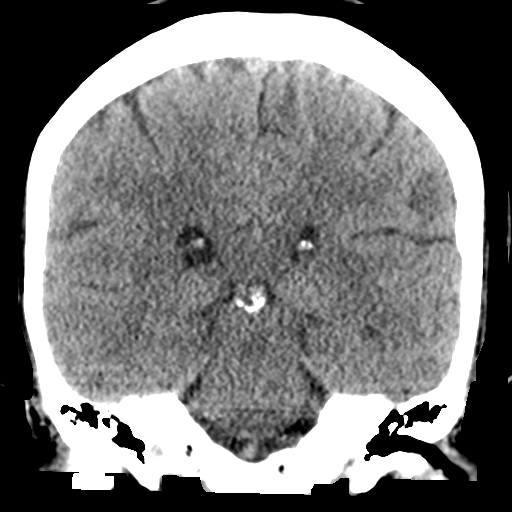
[im 39/70  brain]
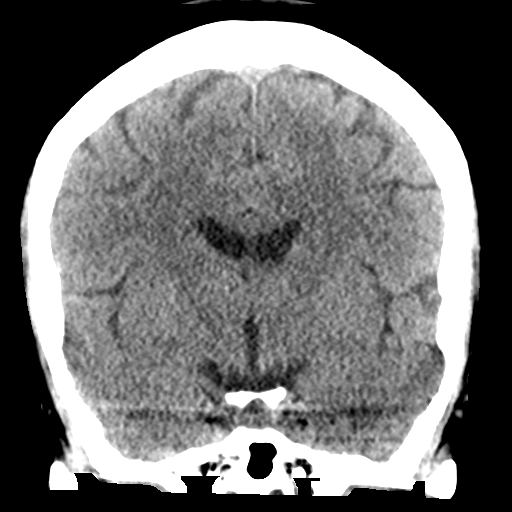

[Series 5: sagittal soft tissue · sagittal · 0.32mm/px · 3 of 56 slices shown]
[im 19/56  brain]
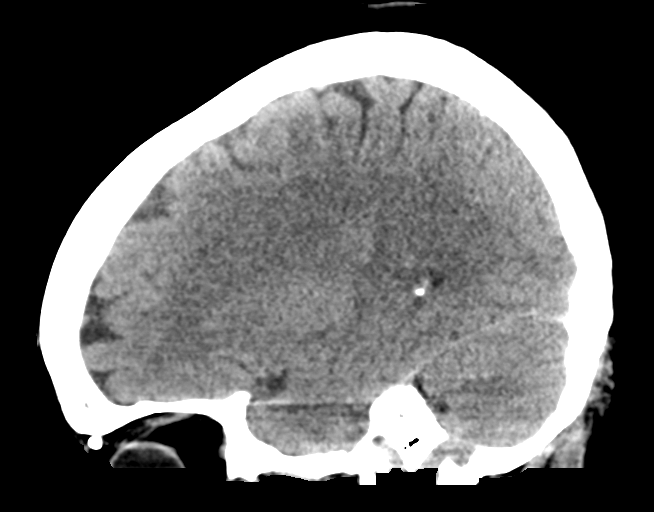
[im 28/56  brain]
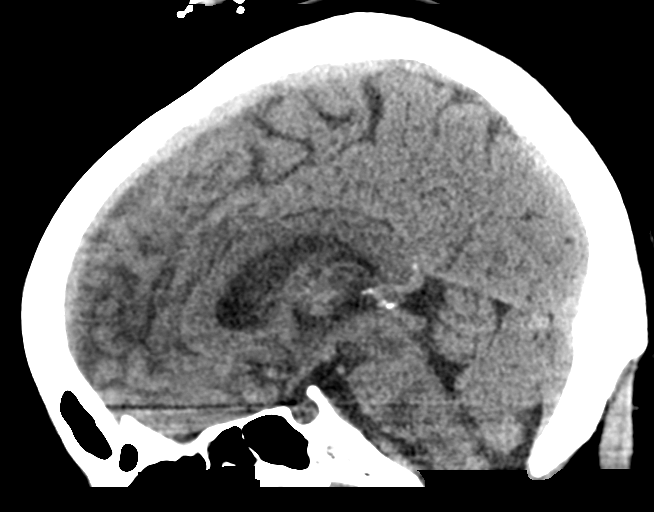
[im 37/56  brain]
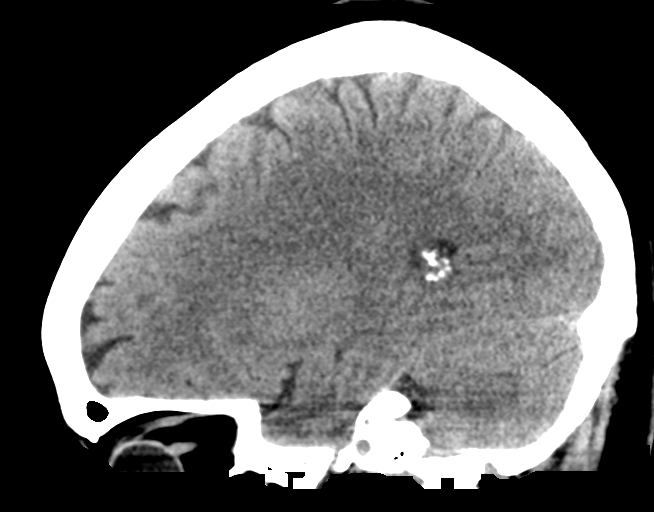

[16 of 47 positions shown; findings below may reference images not displayed]

FINDINGS: Brain: The ventricles are normal in size and configuration. There is
a small cavum septum pellucidum, an anatomic variant. There is no
intracranial mass, hemorrhage, extra-axial fluid collection, or
midline shift. The gray-white compartments appear normal. No acute
infarct evident.

Vascular: No hyperdense vessel. There is no appreciable vascular
calcification.

Skull: The bony calvarium appears intact.

Sinuses/Orbits: Visualized paranasal sinuses are clear. Visualized
orbits appear symmetric bilaterally.

Other: Mastoid air cells are clear.
IMPRESSION: Study within normal limits.
